# Patient Record
Sex: Male | Born: 1950 | Race: White | Hispanic: No | Marital: Married | Smoking: Former smoker
Health system: Southern US, Community
[De-identification: ages and names within clinical notes are randomized; demographics above are authoritative.]

## PROBLEM LIST (undated history)

## (undated) DIAGNOSIS — E559 Vitamin D deficiency, unspecified: Secondary | ICD-10-CM

## (undated) DIAGNOSIS — F988 Other specified behavioral and emotional disorders with onset usually occurring in childhood and adolescence: Secondary | ICD-10-CM

## (undated) DIAGNOSIS — F32A Depression, unspecified: Secondary | ICD-10-CM

## (undated) DIAGNOSIS — I10 Essential (primary) hypertension: Secondary | ICD-10-CM

## (undated) DIAGNOSIS — F419 Anxiety disorder, unspecified: Secondary | ICD-10-CM

## (undated) DIAGNOSIS — E785 Hyperlipidemia, unspecified: Secondary | ICD-10-CM

## (undated) DIAGNOSIS — E119 Type 2 diabetes mellitus without complications: Secondary | ICD-10-CM

## (undated) DIAGNOSIS — F329 Major depressive disorder, single episode, unspecified: Secondary | ICD-10-CM

## (undated) HISTORY — DX: Major depressive disorder, single episode, unspecified: F32.9

## (undated) HISTORY — DX: Other specified behavioral and emotional disorders with onset usually occurring in childhood and adolescence: F98.8

## (undated) HISTORY — PX: COLONOSCOPY: SHX174

## (undated) HISTORY — DX: Type 2 diabetes mellitus without complications: E11.9

## (undated) HISTORY — DX: Vitamin D deficiency, unspecified: E55.9

## (undated) HISTORY — DX: Anxiety disorder, unspecified: F41.9

## (undated) HISTORY — DX: Hyperlipidemia, unspecified: E78.5

## (undated) HISTORY — DX: Depression, unspecified: F32.A

## (undated) HISTORY — PX: POLYPECTOMY: SHX149

## (undated) HISTORY — DX: Essential (primary) hypertension: I10

---

## 2005-01-08 ENCOUNTER — Emergency Department (HOSPITAL_COMMUNITY): Admission: EM | Admit: 2005-01-08 | Discharge: 2005-01-08 | Payer: Self-pay | Admitting: Emergency Medicine

## 2007-01-10 ENCOUNTER — Ambulatory Visit: Payer: Self-pay | Admitting: Internal Medicine

## 2007-01-24 ENCOUNTER — Ambulatory Visit: Payer: Self-pay | Admitting: Internal Medicine

## 2007-07-18 ENCOUNTER — Emergency Department (HOSPITAL_COMMUNITY): Admission: EM | Admit: 2007-07-18 | Discharge: 2007-07-18 | Payer: Self-pay | Admitting: Emergency Medicine

## 2009-06-27 IMAGING — CT CT MAXILLOFACIAL W/O CM
1 series · 1 of 2 positions shown · IV contrast (agent unspecified)
Comparison: None.

CLINICAL DATA: Fall.  Laceration across nose.  
 MAXILLOFACIAL CT WITHOUT CONTRAST:
TECHNIQUE: Axial and coronal CT imaging was performed through the maxillofacial structures.  No intravenous contrast was administered.

[Series 2: topogram 0.6 t80s · sagittal · 0.6mm · 1.00mm/px · 1 of 2 slices shown]
[im 2/2]
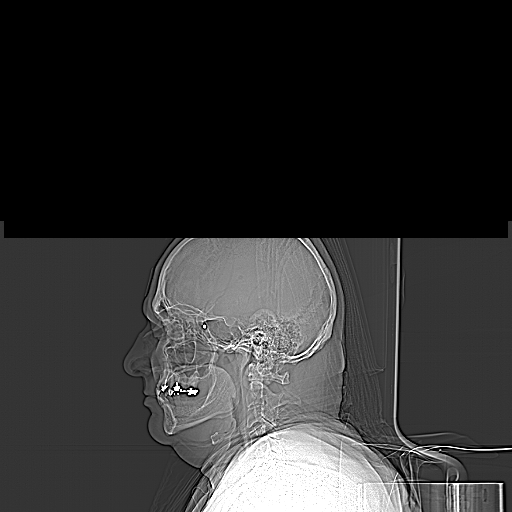

[1 of 2 positions shown; findings below may reference images not displayed]

FINDINGS: Soft tissue swelling is noted over the bridge of the nose with tiny radiopacities that could represent foreign body or blood product.  No underlying fracture is seen.  The orbits and paranasal sinuses are intact.
IMPRESSION: Soft tissue swelling over the bridge of the nose.  Associated contusion/blood product versus foreign body.

## 2012-03-17 ENCOUNTER — Encounter: Payer: Self-pay | Admitting: Internal Medicine

## 2012-11-06 ENCOUNTER — Encounter: Payer: Self-pay | Admitting: Internal Medicine

## 2013-08-23 ENCOUNTER — Encounter: Payer: Self-pay | Admitting: Emergency Medicine

## 2013-08-23 DIAGNOSIS — E1169 Type 2 diabetes mellitus with other specified complication: Secondary | ICD-10-CM | POA: Insufficient documentation

## 2013-08-23 DIAGNOSIS — F325 Major depressive disorder, single episode, in full remission: Secondary | ICD-10-CM | POA: Insufficient documentation

## 2013-08-23 DIAGNOSIS — F32A Depression, unspecified: Secondary | ICD-10-CM

## 2013-08-23 DIAGNOSIS — F329 Major depressive disorder, single episode, unspecified: Secondary | ICD-10-CM

## 2013-08-23 DIAGNOSIS — E1129 Type 2 diabetes mellitus with other diabetic kidney complication: Secondary | ICD-10-CM | POA: Insufficient documentation

## 2013-08-23 DIAGNOSIS — F988 Other specified behavioral and emotional disorders with onset usually occurring in childhood and adolescence: Secondary | ICD-10-CM | POA: Insufficient documentation

## 2013-08-23 DIAGNOSIS — E119 Type 2 diabetes mellitus without complications: Secondary | ICD-10-CM

## 2013-08-23 DIAGNOSIS — E559 Vitamin D deficiency, unspecified: Secondary | ICD-10-CM | POA: Insufficient documentation

## 2013-08-23 DIAGNOSIS — E785 Hyperlipidemia, unspecified: Secondary | ICD-10-CM

## 2013-08-23 DIAGNOSIS — I10 Essential (primary) hypertension: Secondary | ICD-10-CM | POA: Insufficient documentation

## 2013-08-23 DIAGNOSIS — F419 Anxiety disorder, unspecified: Secondary | ICD-10-CM

## 2013-08-27 ENCOUNTER — Ambulatory Visit (INDEPENDENT_AMBULATORY_CARE_PROVIDER_SITE_OTHER): Payer: BC Managed Care – PPO | Admitting: Emergency Medicine

## 2013-08-27 ENCOUNTER — Encounter: Payer: Self-pay | Admitting: Emergency Medicine

## 2013-08-27 VITALS — BP 126/84 | HR 98 | Temp 98.0°F | Resp 18 | Ht 68.0 in | Wt 236.0 lb

## 2013-08-27 DIAGNOSIS — E782 Mixed hyperlipidemia: Secondary | ICD-10-CM

## 2013-08-27 DIAGNOSIS — R7309 Other abnormal glucose: Secondary | ICD-10-CM

## 2013-08-27 DIAGNOSIS — I10 Essential (primary) hypertension: Secondary | ICD-10-CM

## 2013-08-27 DIAGNOSIS — F988 Other specified behavioral and emotional disorders with onset usually occurring in childhood and adolescence: Secondary | ICD-10-CM

## 2013-08-27 DIAGNOSIS — E119 Type 2 diabetes mellitus without complications: Secondary | ICD-10-CM

## 2013-08-27 LAB — HEPATIC FUNCTION PANEL
ALK PHOS: 57 U/L (ref 39–117)
ALT: 15 U/L (ref 0–53)
AST: 15 U/L (ref 0–37)
Albumin: 4.4 g/dL (ref 3.5–5.2)
BILIRUBIN DIRECT: 0.1 mg/dL (ref 0.0–0.3)
BILIRUBIN INDIRECT: 0.4 mg/dL (ref 0.2–1.2)
TOTAL PROTEIN: 7.1 g/dL (ref 6.0–8.3)
Total Bilirubin: 0.5 mg/dL (ref 0.2–1.2)

## 2013-08-27 LAB — BASIC METABOLIC PANEL WITH GFR
BUN: 16 mg/dL (ref 6–23)
CALCIUM: 9.5 mg/dL (ref 8.4–10.5)
CHLORIDE: 105 meq/L (ref 96–112)
CO2: 26 mEq/L (ref 19–32)
Creat: 1.13 mg/dL (ref 0.50–1.35)
GFR, EST AFRICAN AMERICAN: 79 mL/min
GFR, EST NON AFRICAN AMERICAN: 68 mL/min
Glucose, Bld: 178 mg/dL — ABNORMAL HIGH (ref 70–99)
POTASSIUM: 5.2 meq/L (ref 3.5–5.3)
SODIUM: 139 meq/L (ref 135–145)

## 2013-08-27 LAB — CBC WITH DIFFERENTIAL/PLATELET
BASOS ABS: 0 10*3/uL (ref 0.0–0.1)
BASOS PCT: 0 % (ref 0–1)
EOS ABS: 0.2 10*3/uL (ref 0.0–0.7)
EOS PCT: 3 % (ref 0–5)
HEMATOCRIT: 43.7 % (ref 39.0–52.0)
HEMOGLOBIN: 15.4 g/dL (ref 13.0–17.0)
LYMPHS ABS: 1.7 10*3/uL (ref 0.7–4.0)
LYMPHS PCT: 24 % (ref 12–46)
MCH: 31.3 pg (ref 26.0–34.0)
MCHC: 35.2 g/dL (ref 30.0–36.0)
MCV: 88.8 fL (ref 78.0–100.0)
Monocytes Absolute: 0.7 10*3/uL (ref 0.1–1.0)
Monocytes Relative: 10 % (ref 3–12)
NEUTROS PCT: 63 % (ref 43–77)
Neutro Abs: 4.6 10*3/uL (ref 1.7–7.7)
Platelets: 310 10*3/uL (ref 150–400)
RBC: 4.92 MIL/uL (ref 4.22–5.81)
RDW: 13.6 % (ref 11.5–15.5)
WBC: 7.2 10*3/uL (ref 4.0–10.5)

## 2013-08-27 LAB — HEMOGLOBIN A1C
HEMOGLOBIN A1C: 8 % — AB (ref <5.7)
MEAN PLASMA GLUCOSE: 183 mg/dL — AB (ref <117)

## 2013-08-27 LAB — LIPID PANEL
CHOL/HDL RATIO: 3.2 ratio
Cholesterol: 210 mg/dL — ABNORMAL HIGH (ref 0–200)
HDL: 66 mg/dL (ref >39)
LDL Cholesterol: 114 mg/dL — ABNORMAL HIGH (ref 0–99)
Triglycerides: 152 mg/dL — ABNORMAL HIGH (ref <150)
VLDL: 30 mg/dL (ref 0–40)

## 2013-08-27 MED ORDER — METHYLPHENIDATE HCL ER (OSM) 36 MG PO TBCR: 36.0000 mg | EXTENDED_RELEASE_TABLET | Freq: Two times a day (BID) | ORAL | Status: DC

## 2013-08-27 NOTE — Patient Instructions (Signed)
Fat and Cholesterol Control Diet Fat and cholesterol levels in your blood and organs are influenced by your diet. High levels of fat and cholesterol may lead to diseases of the heart, small and large blood vessels, gallbladder, liver, and pancreas. CONTROLLING FAT AND CHOLESTEROL WITH DIET Although exercise and lifestyle factors are important, your diet is key. That is because certain foods are known to raise cholesterol and others to lower it. The goal is to balance foods for their effect on cholesterol and more importantly, to replace saturated and trans fat with other types of fat, such as monounsaturated fat, polyunsaturated fat, and omega-3 fatty acids. On average, a person should consume no more than 15 to 17 g of saturated fat daily. Saturated and trans fats are considered "bad" fats, and they will raise LDL cholesterol. Saturated fats are primarily found in animal products such as meats, butter, and cream. However, that does not mean you need to give up all your favorite foods. Today, there are good tasting, low-fat, low-cholesterol substitutes for most of the things you like to eat. Choose low-fat or nonfat alternatives. Choose round or loin cuts of red meat. These types of cuts are lowest in fat and cholesterol. Chicken (without the skin), fish, veal, and ground turkey breast are great choices. Eliminate fatty meats, such as hot dogs and salami. Even shellfish have little or no saturated fat. Have a 3 oz (85 g) portion when you eat lean meat, poultry, or fish. Trans fats are also called "partially hydrogenated oils." They are oils that have been scientifically manipulated so that they are solid at room temperature resulting in a longer shelf life and improved taste and texture of foods in which they are added. Trans fats are found in stick margarine, some tub margarines, cookies, crackers, and baked goods.  When baking and cooking, oils are a great substitute for butter. The monounsaturated oils are  especially beneficial since it is believed they lower LDL and raise HDL. The oils you should avoid entirely are saturated tropical oils, such as coconut and palm.  Remember to eat a lot from food groups that are naturally free of saturated and trans fat, including fish, fruit, vegetables, beans, grains (barley, rice, couscous, bulgur wheat), and pasta (without cream sauces).  IDENTIFYING FOODS THAT LOWER FAT AND CHOLESTEROL  Soluble fiber may lower your cholesterol. This type of fiber is found in fruits such as apples, vegetables such as broccoli, potatoes, and carrots, legumes such as beans, peas, and lentils, and grains such as barley. Foods fortified with plant sterols (phytosterol) may also lower cholesterol. You should eat at least 2 g per day of these foods for a cholesterol lowering effect.  Read package labels to identify low-saturated fats, trans fat free, and low-fat foods at the supermarket. Select cheeses that have only 2 to 3 g saturated fat per ounce. Use a heart-healthy tub margarine that is free of trans fats or partially hydrogenated oil. When buying baked goods (cookies, crackers), avoid partially hydrogenated oils. Breads and muffins should be made from whole grains (whole-wheat or whole oat flour, instead of "flour" or "enriched flour"). Buy non-creamy canned soups with reduced salt and no added fats.  FOOD PREPARATION TECHNIQUES  Never deep-fry. If you must fry, either stir-fry, which uses very little fat, or use non-stick cooking sprays. When possible, broil, bake, or roast meats, and steam vegetables. Instead of putting butter or margarine on vegetables, use lemon and herbs, applesauce, and cinnamon (for squash and sweet potatoes). Use nonfat   yogurt, salsa, and low-fat dressings for salads.  LOW-SATURATED FAT / LOW-FAT FOOD SUBSTITUTES Meats / Saturated Fat (g)  Avoid: Steak, marbled (3 oz/85 g) / 11 g  Choose: Steak, lean (3 oz/85 g) / 4 g  Avoid: Hamburger (3 oz/85 g) / 7  g  Choose: Hamburger, lean (3 oz/85 g) / 5 g  Avoid: Ham (3 oz/85 g) / 6 g  Choose: Ham, lean cut (3 oz/85 g) / 2.4 g  Avoid: Chicken, with skin, dark meat (3 oz/85 g) / 4 g  Choose: Chicken, skin removed, dark meat (3 oz/85 g) / 2 g  Avoid: Chicken, with skin, light meat (3 oz/85 g) / 2.5 g  Choose: Chicken, skin removed, light meat (3 oz/85 g) / 1 g Dairy / Saturated Fat (g)  Avoid: Whole milk (1 cup) / 5 g  Choose: Low-fat milk, 2% (1 cup) / 3 g  Choose: Low-fat milk, 1% (1 cup) / 1.5 g  Choose: Skim milk (1 cup) / 0.3 g  Avoid: Hard cheese (1 oz/28 g) / 6 g  Choose: Skim milk cheese (1 oz/28 g) / 2 to 3 g  Avoid: Cottage cheese, 4% fat (1 cup) / 6.5 g  Choose: Low-fat cottage cheese, 1% fat (1 cup) / 1.5 g  Avoid: Ice cream (1 cup) / 9 g  Choose: Sherbet (1 cup) / 2.5 g  Choose: Nonfat frozen yogurt (1 cup) / 0.3 g  Choose: Frozen fruit bar / trace  Avoid: Whipped cream (1 tbs) / 3.5 g  Choose: Nondairy whipped topping (1 tbs) / 1 g Condiments / Saturated Fat (g)  Avoid: Mayonnaise (1 tbs) / 2 g  Choose: Low-fat mayonnaise (1 tbs) / 1 g  Avoid: Butter (1 tbs) / 7 g  Choose: Extra light margarine (1 tbs) / 1 g  Avoid: Coconut oil (1 tbs) / 11.8 g  Choose: Olive oil (1 tbs) / 1.8 g  Choose: Corn oil (1 tbs) / 1.7 g  Choose: Safflower oil (1 tbs) / 1.2 g  Choose: Sunflower oil (1 tbs) / 1.4 g  Choose: Soybean oil (1 tbs) / 2.4 g  Choose: Canola oil (1 tbs) / 1 g Document Released: 07/16/2005 Document Revised: 11/10/2012 Document Reviewed: 01/04/2011 [employer] Patient Information 2014 [place], [place]. Diabetes and Exercise Exercising regularly is important. It is not just about losing weight. It has many health benefits, such as:  Improving your overall fitness, flexibility, and endurance.  Increasing your bone density.  Helping with weight control.  Decreasing your body fat.  Increasing your muscle strength.  Reducing stress and  tension.  Improving your overall health. People with diabetes who exercise gain additional benefits because exercise:  Reduces appetite.  Improves the body's use of blood sugar (glucose).  Helps lower or control blood glucose.  Decreases blood pressure.  Helps control blood lipids (such as cholesterol and triglycerides).  Improves the body's use of the hormone insulin by:  Increasing the body's insulin sensitivity.  Reducing the body's insulin needs.  Decreases the risk for heart disease because exercising:  Lowers cholesterol and triglycerides levels.  Increases the levels of good cholesterol (such as high-density lipoproteins [HDL]) in the body.  Lowers blood glucose levels. YOUR ACTIVITY PLAN  Choose an activity that you enjoy and set realistic goals. Your health care provider or diabetes educator can help you make an activity plan that works for you. You can break activities into 2 or 3 sessions throughout the day. Doing so is as good  as one long session. Exercise ideas include:  Taking the dog for a walk.  Taking the stairs instead of the elevator.  Dancing to your favorite song.  Doing your favorite exercise with a friend. RECOMMENDATIONS FOR EXERCISING WITH TYPE 1 OR TYPE 2 DIABETES   Check your blood glucose before exercising. If blood glucose levels are greater than 240 mg/dL, check for urine ketones. Do not exercise if ketones are present.  Avoid injecting insulin into areas of the body that are going to be exercised. For example, avoid injecting insulin into:  The arms when playing tennis.  The legs when jogging.  Keep a record of:  Food intake before and after you exercise.  Expected peak times of insulin action.  Blood glucose levels before and after you exercise.  The type and amount of exercise you have done.  Review your records with your health care provider. Your health care provider will help you to develop guidelines for adjusting food  intake and insulin amounts before and after exercising.  If you take insulin or oral hypoglycemic agents, watch for signs and symptoms of hypoglycemia. They include:  Dizziness.  Shaking.  Sweating.  Chills.  Confusion.  Drink plenty of water while you exercise to prevent dehydration or heat stroke. Body water is lost during exercise and must be replaced.  Talk to your health care provider before starting an exercise program to make sure it is safe for you. Remember, almost any type of activity is better than none. Document Released: 10/06/2003 Document Revised: 03/18/2013 Document Reviewed: 12/23/2012 [facility] Patient Information 2014 [place].

## 2013-08-27 NOTE — Progress Notes (Signed)
Subjective:    Patient ID: Joseph Campbell, male    DOB: 1951-07-22, 63 y.o.   MRN: [id number]  HPI Comments: 63 yo male presents for 3 month F/U for HTN, Cholesterol, DM, D. Deficient, ADD. He is TRYING TO IMPROVE DIET BUT SKIPS MEALS. He occasionally misses Metformin. He is exercising only with work. LAST LABS BS 179 T 210 L 118 A1C 7.7 INSULIN 30 D 49 He notes he seems more fatigued in the evening and often falls asleep watching TV.   He notes ADD is controlled with medications.  Hyperlipidemia  Hypertension  Diabetes Associated symptoms include fatigue.     Medication List       This list is accurate as of: 08/27/13  9:39 AM.  Always use your most recent med list.               ergocalciferol 50000 UNITS capsule  Commonly known as:  VITAMIN D2  Take 50,000 Units by mouth every other day.     metFORMIN 500 MG 24 hr tablet  Commonly known as:  GLUCOPHAGE-XR  Take 500 mg by mouth 2 (two) times daily.     methylphenidate 36 MG CR tablet  Commonly known as:  CONCERTA  Take 36 mg by mouth 2 (two) times daily.     olmesartan 40 MG tablet  Commonly known as:  BENICAR  Take 40 mg by mouth daily.     omega-3 acid ethyl esters 1 G capsule  Commonly known as:  LOVAZA  Take 1 g by mouth 2 (two) times daily.     rosuvastatin 20 MG tablet  Commonly known as:  CRESTOR  Take 20 mg by mouth daily.       ALLERGIES Adderall; Lipitor; Wellbutrin; and Zoloft  Past Medical History  Diagnosis Date  . Hyperlipidemia   . Hypertension   . Anxiety   . Depression   . Diabetes mellitus without complication   . ADD (attention deficit disorder)   . Unspecified vitamin D deficiency      Review of Systems  Constitutional: Positive for fatigue.  All other systems reviewed and are negative.   BP 126/84  Pulse 98  Temp(Src) 98 F (36.7 C) (Temporal)  Resp 18  Ht 5\' 8"  (1.727 m)  Wt 236 lb (107.049 kg)  BMI 35.89 kg/m2     Objective:   Physical Exam  Nursing note and  vitals reviewed. Constitutional: He is oriented to person, place, and time. He appears well-developed and well-nourished.  HENT:  Head: Normocephalic and atraumatic.  Right Ear: External ear normal.  Left Ear: External ear normal.  Nose: Nose normal.  Partially obstructed airway  Eyes: Conjunctivae and EOM are normal.  Neck: Normal range of motion. Neck supple. No JVD present. No thyromegaly present.  Large neck  Cardiovascular: Normal rate, regular rhythm, normal heart sounds and intact distal pulses.   Pulmonary/Chest: Effort normal and breath sounds normal.  Abdominal: Soft. Bowel sounds are normal. He exhibits no distension and no mass. There is no tenderness. There is no rebound and no guarding.  Musculoskeletal: Normal range of motion. He exhibits no edema and no tenderness.  Lymphadenopathy:    He has no cervical adenopathy.  Neurological: He is alert and oriented to person, place, and time. He has normal reflexes. No cranial nerve deficit. Coordination normal.  Skin: Skin is warm and dry.  Psychiatric: He has a normal mood and affect. His behavior is normal. Judgment and thought content normal.  Assessment & Plan:  1.  3 month F/U for Obese,  HTN, Cholesterol,DM, D. Deficient. Needs healthy diet, cardio QD and obtain healthy weight. Check Labs, Check BP if >130/80 call office, Check BS if >200 call office. Consider sleep study with weight and HX. Pt declines at this time 2. ADD- refill RX x 61months

## 2013-08-28 LAB — INSULIN, FASTING: Insulin fasting, serum: 9 u[IU]/mL (ref 3–28)

## 2013-09-29 ENCOUNTER — Encounter: Payer: Self-pay | Admitting: Emergency Medicine

## 2013-09-29 ENCOUNTER — Ambulatory Visit (INDEPENDENT_AMBULATORY_CARE_PROVIDER_SITE_OTHER): Payer: BC Managed Care – PPO | Admitting: Emergency Medicine

## 2013-09-29 VITALS — BP 124/82 | HR 100 | Temp 98.2°F | Resp 18 | Ht 68.0 in | Wt 226.0 lb

## 2013-09-29 DIAGNOSIS — J189 Pneumonia, unspecified organism: Secondary | ICD-10-CM

## 2013-09-29 DIAGNOSIS — J209 Acute bronchitis, unspecified: Secondary | ICD-10-CM

## 2013-09-29 MED ORDER — CEFTRIAXONE SODIUM 1 G IJ SOLR
1.0000 g | Freq: Once | INTRAMUSCULAR | Status: AC
  Administered 2013-09-29: 1 g via INTRAMUSCULAR

## 2013-09-29 MED ORDER — IPRATROPIUM-ALBUTEROL 0.5-2.5 (3) MG/3ML IN SOLN
3.0000 mL | Freq: Once | RESPIRATORY_TRACT | Status: AC
  Administered 2013-09-29: 3 mL via RESPIRATORY_TRACT

## 2013-09-29 MED ORDER — AZITHROMYCIN 250 MG PO TABS: ORAL_TABLET | ORAL | Status: AC

## 2013-09-29 MED ORDER — BENZONATATE 200 MG PO CAPS: 200.0000 mg | ORAL_CAPSULE | Freq: Three times a day (TID) | ORAL | Status: DC | PRN

## 2013-09-29 MED ORDER — PHENYLEPH-PROMETHAZINE-COD 5-6.25-10 MG/5ML PO SYRP: ORAL_SOLUTION | ORAL | Status: DC

## 2013-09-29 MED ORDER — ALBUTEROL SULFATE HFA 108 (90 BASE) MCG/ACT IN AERS: 2.0000 | INHALATION_SPRAY | Freq: Four times a day (QID) | RESPIRATORY_TRACT | Status: DC | PRN

## 2013-09-29 NOTE — Progress Notes (Signed)
   Subjective:    Patient ID: Joseph Campbell, male    DOB: [birth year], 63 y.o.   MRN: [id number]  HPI Comments: 63 yo with chest congestion, wheezing, and pain with hard cough. He cannot get production up due to thickness. He is using Nyquil cold/ flu w/o relief.   Cough   Current Outpatient Prescriptions on File Prior to Visit  Medication Sig Dispense Refill  . ergocalciferol (VITAMIN D2) 50000 UNITS capsule Take 50,000 Units by mouth every other day.      . metFORMIN (GLUCOPHAGE-XR) 500 MG 24 hr tablet Take 500 mg by mouth 2 (two) times daily.      Derrill Memo ON 10/27/2013] methylphenidate (CONCERTA) 36 MG CR tablet Take 1 tablet (36 mg total) by mouth 2 (two) times daily.  60 tablet  0  . olmesartan (BENICAR) 40 MG tablet Take 40 mg by mouth daily.      Marland Kitchen omega-3 acid ethyl esters (LOVAZA) 1 G capsule Take 1 g by mouth 2 (two) times daily.      . rosuvastatin (CRESTOR) 20 MG tablet Take 20 mg by mouth daily.       No current facility-administered medications on file prior to visit.   Allergies  Allergen Reactions  . Adderall [Amphetamine-Dextroamphet Er]     MOOD CHANGE  . Lipitor [Atorvastatin]     MYALGIA  . Wellbutrin [Bupropion]     HA  . Zoloft [Sertraline Hcl]     AGGITATION   Past Medical History  Diagnosis Date  . Hyperlipidemia   . Hypertension   . Anxiety   . Depression   . Diabetes mellitus without complication   . ADD (attention deficit disorder)   . Unspecified vitamin D deficiency        Review of Systems  HENT: Positive for congestion.   Respiratory: Positive for cough.   All other systems reviewed and are negative.   BP 124/82  Pulse 100  Temp(Src) 98.2 F (36.8 C) (Temporal)  Resp 18  Ht 5\' 8"  (1.727 m)  Wt 226 lb (102.513 kg)  BMI 34.37 kg/m2     Objective:   Physical Exam  Nursing note and vitals reviewed. Constitutional: He is oriented to person, place, and time. He appears well-developed and well-nourished.  HENT:  Head:  Normocephalic and atraumatic.  Right Ear: External ear normal.  Left Ear: External ear normal.  Nose: Nose normal.  Mouth/Throat: Oropharynx is clear and moist. No oropharyngeal exudate.  Cloudy TM's bilaterally   Eyes: Conjunctivae are normal.  Neck: Normal range of motion.  Cardiovascular: Normal rate, regular rhythm, normal heart sounds and intact distal pulses.   Pulmonary/Chest: Effort normal.  + rhonchi  Abdominal: Soft.  Musculoskeletal: Normal range of motion.  Lymphadenopathy:    He has no cervical adenopathy.  Neurological: He is alert and oriented to person, place, and time.  Skin: Skin is warm and dry.  Psychiatric: He has a normal mood and affect. Judgment normal.          Assessment & Plan:  1. Pneumonia vs bronchitis- Duoneb given in office with good results with loosened cough, Rocephin 1 gm, ZPAK/ Tessalon Perles/ Albuterol HFA all AD. w/c if SX increase or ER.  2. DM noncompliant discussed need for taking MF 1000mg  BID or will need insulin, EXPLAINED at  Increased risk for heart attack/ stroke/ blindness/ kidney failure/ cancer and many more complications with horrible labs.

## 2013-09-29 NOTE — Patient Instructions (Signed)
Pneumonia, Adult Pneumonia is an infection of the lungs. It may be caused by a germ (virus or bacteria). Some types of pneumonia can spread easily from person to person. This can happen when you cough or sneeze. HOME CARE  Only take medicine as told by your doctor.  Take your medicine (antibiotics) as told. Finish it even if you start to feel better.  Do not smoke.  You may use a vaporizer or humidifier in your room. This can help loosen thick spit (mucus).  Sleep so you are almost sitting up (semi-upright). This helps reduce coughing.  Rest. A shot (vaccine) can help prevent pneumonia. Shots are often advised for:  People over 65 years old.  Patients on chemotherapy.  People with long-term (chronic) lung problems.  People with immune system problems. GET HELP RIGHT AWAY IF:   You are getting worse.  You cannot control your cough, and you are losing sleep.  You cough up blood.  Your pain gets worse, even with medicine.  You have a fever.  Any of your problems are getting worse, not better.  You have shortness of breath or chest pain. MAKE SURE YOU:   Understand these instructions.  Will watch your condition.  Will get help right away if you are not doing well or get worse. Document Released: 01/02/2008 Document Revised: 10/08/2011 Document Reviewed: 10/06/2010 [employer] Patient Information 2014 [employer]. Bronchitis Bronchitis is swelling (inflammation) of the air tubes leading to your lungs (bronchi). This causes mucus and a cough. If the swelling gets bad, you may have trouble breathing. HOME CARE   Rest.  Drink enough fluids to keep your pee (urine) clear or pale yellow (unless you have a condition where you have to watch how much you drink).  Only take medicine as told by your doctor. If you were given antibiotic medicines, finish them even if you start to feel better.  Avoid smoke, irritating chemicals, and strong smells. These make the problem  worse. Quit smoking if you smoke. This helps your lungs heal faster.  Use a cool mist humidifier. Change the water in the humidifier every day. You can also sit in the bathroom with hot shower running for 5 10 minutes. Keep the door closed.  See your health care provider as told.  Wash your hands often. GET HELP IF: Your problems do not get better after 1 week. GET HELP RIGHT AWAY IF:   Your fever gets worse.  You have chills.  Your chest hurts.  Your problems breathing get worse.  You have blood in your mucus.  You pass out (faint).  You feel lightheaded.  You have a bad headache.  You throw up (vomit) again and again. MAKE SURE YOU:  Understand these instructions.  Will watch your condition.  Will get help right away if you are not doing well or get worse. Document Released: 01/02/2008 Document Revised: 05/06/2013 Document Reviewed: 03/10/2013 [employer] Patient Information 2014 [employer].  

## 2013-11-16 ENCOUNTER — Ambulatory Visit (INDEPENDENT_AMBULATORY_CARE_PROVIDER_SITE_OTHER): Payer: BC Managed Care – PPO | Admitting: Internal Medicine

## 2013-11-16 ENCOUNTER — Other Ambulatory Visit: Payer: Self-pay | Admitting: Emergency Medicine

## 2013-11-16 ENCOUNTER — Encounter: Payer: Self-pay | Admitting: Internal Medicine

## 2013-11-16 VITALS — BP 110/78 | HR 80 | Temp 97.7°F | Resp 16 | Ht 68.0 in | Wt 232.4 lb

## 2013-11-16 DIAGNOSIS — F988 Other specified behavioral and emotional disorders with onset usually occurring in childhood and adolescence: Secondary | ICD-10-CM

## 2013-11-16 DIAGNOSIS — E785 Hyperlipidemia, unspecified: Secondary | ICD-10-CM

## 2013-11-16 DIAGNOSIS — E559 Vitamin D deficiency, unspecified: Secondary | ICD-10-CM

## 2013-11-16 DIAGNOSIS — I10 Essential (primary) hypertension: Secondary | ICD-10-CM

## 2013-11-16 DIAGNOSIS — Z79899 Other long term (current) drug therapy: Secondary | ICD-10-CM

## 2013-11-16 DIAGNOSIS — E1129 Type 2 diabetes mellitus with other diabetic kidney complication: Secondary | ICD-10-CM

## 2013-11-16 LAB — CBC WITH DIFFERENTIAL/PLATELET
BASOS PCT: 0 % (ref 0–1)
Basophils Absolute: 0 10*3/uL (ref 0.0–0.1)
EOS ABS: 0.2 10*3/uL (ref 0.0–0.7)
Eosinophils Relative: 3 % (ref 0–5)
HCT: 39.6 % (ref 39.0–52.0)
HEMOGLOBIN: 13.5 g/dL (ref 13.0–17.0)
LYMPHS ABS: 1.8 10*3/uL (ref 0.7–4.0)
LYMPHS PCT: 24 % (ref 12–46)
MCH: 30 pg (ref 26.0–34.0)
MCHC: 34.1 g/dL (ref 30.0–36.0)
MCV: 88 fL (ref 78.0–100.0)
MONO ABS: 0.7 10*3/uL (ref 0.1–1.0)
Monocytes Relative: 9 % (ref 3–12)
NEUTROS ABS: 4.7 10*3/uL (ref 1.7–7.7)
NEUTROS PCT: 64 % (ref 43–77)
Platelets: 281 10*3/uL (ref 150–400)
RBC: 4.5 MIL/uL (ref 4.22–5.81)
RDW: 13.2 % (ref 11.5–15.5)
WBC: 7.3 10*3/uL (ref 4.0–10.5)

## 2013-11-16 LAB — BASIC METABOLIC PANEL WITH GFR
BUN: 27 mg/dL — ABNORMAL HIGH (ref 6–23)
CO2: 25 meq/L (ref 19–32)
CREATININE: 0.93 mg/dL (ref 0.50–1.35)
Calcium: 9.5 mg/dL (ref 8.4–10.5)
Chloride: 103 mEq/L (ref 96–112)
GFR, Est African American: >89 mL/min
GFR, Est Non African American: 86 mL/min
Glucose, Bld: 169 mg/dL — ABNORMAL HIGH (ref 70–99)
POTASSIUM: 4.6 meq/L (ref 3.5–5.3)
Sodium: 137 mEq/L (ref 135–145)

## 2013-11-16 LAB — HEPATIC FUNCTION PANEL
ALBUMIN: 4.1 g/dL (ref 3.5–5.2)
ALT: 14 U/L (ref 0–53)
AST: 12 U/L (ref 0–37)
Alkaline Phosphatase: 51 U/L (ref 39–117)
BILIRUBIN DIRECT: 0.1 mg/dL (ref 0.0–0.3)
BILIRUBIN TOTAL: 0.3 mg/dL (ref 0.2–1.2)
Indirect Bilirubin: 0.2 mg/dL (ref 0.2–1.2)
TOTAL PROTEIN: 6.5 g/dL (ref 6.0–8.3)

## 2013-11-16 LAB — LIPID PANEL
CHOL/HDL RATIO: 2.8 ratio
CHOLESTEROL: 151 mg/dL (ref 0–200)
HDL: 53 mg/dL (ref >39)
LDL Cholesterol: 67 mg/dL (ref 0–99)
TRIGLYCERIDES: 156 mg/dL — AB (ref <150)
VLDL: 31 mg/dL (ref 0–40)

## 2013-11-16 LAB — TSH: TSH: 2.389 u[IU]/mL (ref 0.350–4.500)

## 2013-11-16 LAB — MAGNESIUM: MAGNESIUM: 1.9 mg/dL (ref 1.5–2.5)

## 2013-11-16 LAB — HEMOGLOBIN A1C
HEMOGLOBIN A1C: 7 % — AB (ref <5.7)
MEAN PLASMA GLUCOSE: 154 mg/dL — AB (ref <117)

## 2013-11-16 MED ORDER — OMEGA 3 340 MG PO CPDR: 300.0000 mg | DELAYED_RELEASE_CAPSULE | Freq: Every day | ORAL | Status: AC

## 2013-11-16 MED ORDER — METHYLPHENIDATE HCL ER (OSM) 36 MG PO TBCR: 36.0000 mg | EXTENDED_RELEASE_TABLET | Freq: Two times a day (BID) | ORAL | Status: DC

## 2013-11-16 MED ORDER — METFORMIN HCL ER 500 MG PO TB24: ORAL_TABLET | ORAL | Status: DC

## 2013-11-16 NOTE — Progress Notes (Signed)
Patient ID: Joseph Campbell, male   DOB: 1951-02-10, 63 y.o.   MRN: [id number]    This very nice 63 y.o. [name] presents for 3 month follow up with Hypertension, Hyperlipidemia, Pre-Diabetes and Vitamin D Deficiency.    HTN predates since   . BP has been controlled at home. Today's BP: 110/78 mmHg . Patient denies any cardiac type chest pain, palpitations, dyspnea/orthopnea/PND, dizziness, claudication, or dependent edema.   Hyperlipidemia is controlled with diet & meds. Last Cholesterol was 210, Triglycerides were 152, HDL 66 and LDL 114 in Jan 2015 - not at goal. Patient denies myalgias or other med SE's.    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 He reports or alleges that FBG's usu run in the 120's.  Patient denies any symptoms of reactive hypoglycemia, diabetic polys, paresthesias or visual blurring.    Also, patient has ADD with improvement on Concerta. Further, Patient has history of Vitamin D Deficiency with last vitamin D of  49 in Oct  2014. Patient supplements vitamin D without any suspected side-effects.    Medication List       This list is accurate as of: 11/16/13 10:02 PM.  Always use your most recent med list.               albuterol 108 (90 BASE) MCG/ACT inhaler  Commonly known as:  PROVENTIL HFA;VENTOLIN HFA  Inhale 2 puffs into the lungs every 6 (six) hours as needed for wheezing or shortness of breath.     ergocalciferol 50000 UNITS capsule  Commonly known as:  VITAMIN D2  Take 50,000 Units by mouth every other day.     metFORMIN 500 MG 24 hr tablet  Commonly known as:  GLUCOPHAGE-XR  Take 4 tablets daily     methylphenidate 36 MG CR tablet  Commonly known as:  CONCERTA  Take 1 tablet (36 mg total) by mouth 2 (two) times daily.     olmesartan 40 MG tablet  Commonly known as:  BENICAR  Take 40 mg by mouth daily.     Omega 3 340 MG Cpdr  Take 0.8824 capsules (300 mg total) by  mouth daily.     Phenyleph-Promethazine-Cod 5-6.25-10 MG/5ML Syrp  1 tsp QHS     rosuvastatin 20 MG tablet  Commonly known as:  CRESTOR  Take 20 mg by mouth daily.          Allergies  Allergen Reactions  . Adderall [Amphetamine-Dextroamphet Er]     MOOD CHANGE  . Lipitor [Atorvastatin]     MYALGIA  . Wellbutrin [Bupropion]     HA  . Zoloft [Sertraline Hcl]     AGGITATION    PMHx:   Past Medical History  Diagnosis Date  . Hyperlipidemia   . Hypertension   . Anxiety   . Depression   . Diabetes mellitus without complication   . ADD (attention deficit disorder)   . Unspecified vitamin D deficiency     FHx:    Reviewed / unchanged  SHx:    Reviewed / unchanged   Systems Review: Constitutional: Denies fever, chills, wt changes, headaches, insomnia, fatigue, night sweats, change in appetite. Eyes: Denies redness, blurred vision, diplopia, discharge, itchy, watery eyes.  ENT: Denies discharge, congestion, post nasal drip, epistaxis, sore throat, earache, hearing loss, dental pain, tinnitus, vertigo, sinus pain, snoring.  CV: Denies chest pain, palpitations, irregular heartbeat, syncope, dyspnea,  diaphoresis, orthopnea, PND, claudication, edema. Respiratory: denies cough, dyspnea, DOE, pleurisy, hoarseness, laryngitis, wheezing.  Gastrointestinal: Denies dysphagia, odynophagia, heartburn, reflux, water brash, abdominal pain or cramps, nausea, vomiting, bloating, diarrhea, constipation, hematemesis, melena, hematochezia,  or hemorrhoids. Genitourinary: Denies dysuria, frequency, urgency, nocturia, hesitancy, discharge, hematuria, flank pain. Musculoskeletal: Denies arthralgias, myalgias, stiffness, jt. swelling, pain, limp, strain/sprain.  Skin: Denies pruritus, rash, hives, warts, acne, eczema, change in skin lesion(s). Neuro: No weakness, tremor, incoordination, spasms, paresthesia, or pain. Psychiatric: Denies confusion, memory loss, or sensory loss. Endo: Denies change  in weight, skin, hair change.  Heme/Lymph: No excessive bleeding, bruising, orenlarged lymph nodes.   Exam:  BP 110/78  Pulse 80  Temp(Src) 97.7 F (36.5 C) (Temporal)  Resp 16  Ht 5\' 8"  (1.727 m)  Wt 232 lb 6.4 oz (105.416 kg)  BMI 35.34 kg/m2  Appears well nourished - in no distress. Eyes: PERRLA, EOMs, conjunctiva no swelling or erythema. Sinuses: No frontal/maxillary tenderness ENT/Mouth: EAC's clear, TM's nl w/o erythema, bulging. Nares clear w/o erythema, swelling, exudates. Oropharynx clear without erythema or exudates. Oral hygiene is good. Tongue normal, non obstructing. Hearing intact.  Neck: Supple. Thyroid nl. Car 2+/2+ without bruits, nodes or JVD. Chest: Respirations nl with BS clear & equal w/o rales, rhonchi, wheezing or stridor.  Cor: Heart sounds normal w/ regular rate and rhythm without sig. murmurs, gallops, clicks, or rubs. Peripheral pulses normal and equal  without edema.  Abdomen: Soft & bowel sounds normal. Non-tender w/o guarding, rebound, hernias, masses, or organomegaly.  Lymphatics: Unremarkable.  Musculoskeletal: Full ROM all peripheral extremities, joint stability, 5/5 strength, and normal gait.  Skin: Warm, dry without exposed rashes, lesions, ecchymosis apparent.  Neuro: Cranial nerves intact, reflexes equal bilaterally. Sensory-motor testing grossly intact. Tendon reflexes grossly intact.  Pysch: Alert & oriented x 3. Insight and judgement nl & appropriate. No ideations.  Assessment and Plan:  1. Hypertension - Continue monitor blood pressure at home. Continue diet/meds same.  2. Hyperlipidemia - Continue diet/meds, exercise,& lifestyle modifications. Continue monitor periodic cholesterol/liver & renal functions   3. T2 NIDDM w/Stage 2 CKD - continue recommend prudent low glycemic diet, weight control, regular exercise, diabetic monitoring and periodic eye exams.  4. Vitamin D Deficiency - Continue supplementation.  5. Obesity (BMI  34+)  Recommended regular exercise, BP monitoring, weight control, and discussed med and SE's. Recommended labs to assess and monitor clinical status. Further disposition pending results of labs.

## 2013-11-16 NOTE — Patient Instructions (Signed)

## 2013-11-17 LAB — VITAMIN D 25 HYDROXY (VIT D DEFICIENCY, FRACTURES): Vit D, 25-Hydroxy: 86 ng/mL (ref 30–89)

## 2013-11-17 LAB — INSULIN, FASTING: INSULIN FASTING, SERUM: 40 u[IU]/mL — AB (ref 3–28)

## 2013-11-25 ENCOUNTER — Other Ambulatory Visit: Payer: Self-pay | Admitting: *Deleted

## 2013-11-25 MED ORDER — METFORMIN HCL ER 500 MG PO TB24: ORAL_TABLET | ORAL | Status: DC

## 2014-02-02 ENCOUNTER — Other Ambulatory Visit: Payer: Self-pay | Admitting: Emergency Medicine

## 2014-02-02 DIAGNOSIS — F988 Other specified behavioral and emotional disorders with onset usually occurring in childhood and adolescence: Secondary | ICD-10-CM

## 2014-02-02 MED ORDER — METHYLPHENIDATE HCL ER (OSM) 36 MG PO TBCR: 36.0000 mg | EXTENDED_RELEASE_TABLET | Freq: Two times a day (BID) | ORAL | Status: DC

## 2014-02-13 ENCOUNTER — Other Ambulatory Visit: Payer: Self-pay | Admitting: Internal Medicine

## 2014-02-24 ENCOUNTER — Ambulatory Visit: Payer: Self-pay | Admitting: Emergency Medicine

## 2014-03-12 ENCOUNTER — Other Ambulatory Visit: Payer: Self-pay | Admitting: Internal Medicine

## 2014-03-12 DIAGNOSIS — F988 Other specified behavioral and emotional disorders with onset usually occurring in childhood and adolescence: Secondary | ICD-10-CM

## 2014-03-12 MED ORDER — METHYLPHENIDATE HCL ER (OSM) 36 MG PO TBCR: EXTENDED_RELEASE_TABLET | ORAL | Status: DC

## 2014-04-08 ENCOUNTER — Ambulatory Visit (INDEPENDENT_AMBULATORY_CARE_PROVIDER_SITE_OTHER): Payer: BC Managed Care – PPO | Admitting: Internal Medicine

## 2014-04-08 ENCOUNTER — Encounter: Payer: Self-pay | Admitting: Internal Medicine

## 2014-04-08 VITALS — BP 106/64 | HR 82 | Temp 98.4°F | Resp 16 | Ht 68.0 in | Wt 230.0 lb

## 2014-04-08 DIAGNOSIS — E559 Vitamin D deficiency, unspecified: Secondary | ICD-10-CM

## 2014-04-08 DIAGNOSIS — E785 Hyperlipidemia, unspecified: Secondary | ICD-10-CM

## 2014-04-08 DIAGNOSIS — E1129 Type 2 diabetes mellitus with other diabetic kidney complication: Secondary | ICD-10-CM

## 2014-04-08 DIAGNOSIS — Z79899 Other long term (current) drug therapy: Secondary | ICD-10-CM

## 2014-04-08 DIAGNOSIS — F988 Other specified behavioral and emotional disorders with onset usually occurring in childhood and adolescence: Secondary | ICD-10-CM

## 2014-04-08 DIAGNOSIS — I10 Essential (primary) hypertension: Secondary | ICD-10-CM

## 2014-04-08 LAB — BASIC METABOLIC PANEL WITH GFR
BUN: 15 mg/dL (ref 6–23)
CHLORIDE: 100 meq/L (ref 96–112)
CO2: 24 meq/L (ref 19–32)
CREATININE: 1.21 mg/dL (ref 0.50–1.35)
Calcium: 10.3 mg/dL (ref 8.4–10.5)
GFR, Est African American: 72 mL/min
GFR, Est Non African American: 62 mL/min
Glucose, Bld: 124 mg/dL — ABNORMAL HIGH (ref 70–99)
Potassium: 4.5 mEq/L (ref 3.5–5.3)
Sodium: 134 mEq/L — ABNORMAL LOW (ref 135–145)

## 2014-04-08 LAB — CBC WITH DIFFERENTIAL/PLATELET
Basophils Absolute: 0 10*3/uL (ref 0.0–0.1)
Basophils Relative: 0 % (ref 0–1)
Eosinophils Absolute: 0.2 10*3/uL (ref 0.0–0.7)
Eosinophils Relative: 2 % (ref 0–5)
HEMATOCRIT: 43.7 % (ref 39.0–52.0)
Hemoglobin: 15.6 g/dL (ref 13.0–17.0)
Lymphocytes Relative: 19 % (ref 12–46)
Lymphs Abs: 1.7 10*3/uL (ref 0.7–4.0)
MCH: 31.5 pg (ref 26.0–34.0)
MCHC: 35.7 g/dL (ref 30.0–36.0)
MCV: 88.1 fL (ref 78.0–100.0)
MONO ABS: 1 10*3/uL (ref 0.1–1.0)
MONOS PCT: 12 % (ref 3–12)
Neutro Abs: 5.8 10*3/uL (ref 1.7–7.7)
Neutrophils Relative %: 67 % (ref 43–77)
Platelets: 328 10*3/uL (ref 150–400)
RBC: 4.96 MIL/uL (ref 4.22–5.81)
RDW: 12.9 % (ref 11.5–15.5)
WBC: 8.7 10*3/uL (ref 4.0–10.5)

## 2014-04-08 LAB — LIPID PANEL
Cholesterol: 163 mg/dL (ref 0–200)
HDL: 68 mg/dL (ref >39)
LDL CALC: 69 mg/dL (ref 0–99)
Total CHOL/HDL Ratio: 2.4 Ratio
Triglycerides: 128 mg/dL (ref <150)
VLDL: 26 mg/dL (ref 0–40)

## 2014-04-08 LAB — HEPATIC FUNCTION PANEL
ALK PHOS: 66 U/L (ref 39–117)
ALT: 19 U/L (ref 0–53)
AST: 18 U/L (ref 0–37)
Albumin: 4.8 g/dL (ref 3.5–5.2)
BILIRUBIN TOTAL: 0.5 mg/dL (ref 0.2–1.2)
Bilirubin, Direct: 0.1 mg/dL (ref 0.0–0.3)
Indirect Bilirubin: 0.4 mg/dL (ref 0.2–1.2)
TOTAL PROTEIN: 7.5 g/dL (ref 6.0–8.3)

## 2014-04-08 LAB — HEMOGLOBIN A1C
Hgb A1c MFr Bld: 6.9 % — ABNORMAL HIGH (ref <5.7)
Mean Plasma Glucose: 151 mg/dL — ABNORMAL HIGH (ref <117)

## 2014-04-08 LAB — TSH: TSH: 2.849 u[IU]/mL (ref 0.350–4.500)

## 2014-04-08 LAB — MAGNESIUM: Magnesium: 2 mg/dL (ref 1.5–2.5)

## 2014-04-08 MED ORDER — METHYLPHENIDATE HCL ER (OSM) 36 MG PO TBCR: EXTENDED_RELEASE_TABLET | ORAL | Status: DC

## 2014-04-08 NOTE — Progress Notes (Signed)
Patient ID: Joseph Campbell, male   DOB: 27-Feb-1951, 63 y.o.   MRN: [id number]   This very nice 63 y.o.[name] presents for 3 month follow up with Hypertension, Hyperlipidemia, Pre-Diabetes and Vitamin D Deficiency. Patient also has ADD with improved focusing and concentration on treatment.    Patient has labile HTNsince the 1990's, but Treatment was initiated in 2007. BP has been controlled nd t today's BP 106/64 mmHg. Patient denies any cardiac type chest pain, palpitations, dyspnea/orthopnea/PND, dizziness, claudication, or dependent edema.   Hyperlipidemia is controlled with diet & meds. Patient denies myalgias or other med SE's. Last Lipids were Total Chol is 163; HDL  68; LDL 69; Trig128 on 04/08/2014.   Also, the patient has history of T2_NIDDM since 2012 with A1c 6.2%  and patient denies any symptoms of reactive hypoglycemia, diabetic polys, paresthesias or visual blurring.  Last A1c was  6.9% on 04/08/2014.   Further, Patient has history of Vitamin D Deficiency and patient supplements vitamin D without any suspected side-effects. Last vitamin D was  86 on 11/16/2013.   Medication List   albuterol 108 (90 BASE) MCG/ACT inhaler  Commonly known as:  PROVENTIL HFA;VENTOLIN HFA  Inhale 2 puffs into the lungs every 6 (six) hours as needed for wheezing or shortness of breath.     CRESTOR 20 MG tablet  Generic drug:  rosuvastatin  TAKE ONE TABLET BY MOUTH ONCE DAILY FOR CHOLESTEROL     metFORMIN 500 MG 24 hr tablet  Commonly known as:  GLUCOPHAGE-XR  Take 4 tablets daily     methylphenidate 36 MG CR tablet  Commonly known as:  CONCERTA  Take 1/2 to 1 tablet  Or 2 x daily  Start taking on:  05/09/2014     olmesartan 40 MG tablet  Commonly known as:  BENICAR  Take 40 mg by mouth daily.     Omega 3 340 MG Cpdr  Take 0.8824 capsules (300 mg total) by mouth daily.     Vitamin D3 5000 UNITS Tabs  Take 20,000 Units by mouth daily.     Allergies  Allergen Reactions  . Adderall  [Amphetamine-Dextroamphet Er]     MOOD CHANGE  . Lipitor [Atorvastatin]     MYALGIA  . Wellbutrin [Bupropion]     HA  . Zoloft [Sertraline Hcl]     AGGITATION   PMHx:   Past Medical History  Diagnosis Date  . Hyperlipidemia   . Hypertension   . Anxiety   . Depression   . Diabetes mellitus without complication   . ADD (attention deficit disorder)   . Unspecified vitamin D deficiency    FHx:    Reviewed / unchanged SHx:    Reviewed / unchanged  Systems Review:  Constitutional: Denies fever, chills, wt changes, headaches, insomnia, fatigue, night sweats, change in appetite. Eyes: Denies redness, blurred vision, diplopia, discharge, itchy, watery eyes.  ENT: Denies discharge, congestion, post nasal drip, epistaxis, sore throat, earache, hearing loss, dental pain, tinnitus, vertigo, sinus pain, snoring.  CV: Denies chest pain, palpitations, irregular heartbeat, syncope, dyspnea, diaphoresis, orthopnea, PND, claudication or edema. Respiratory: denies cough, dyspnea, DOE, pleurisy, hoarseness, laryngitis, wheezing.  Gastrointestinal: Denies dysphagia, odynophagia, heartburn, reflux, water brash, abdominal pain or cramps, nausea, vomiting, bloating, diarrhea, constipation, hematemesis, melena, hematochezia  or hemorrhoids. Genitourinary: Denies dysuria, frequency, urgency, nocturia, hesitancy, discharge, hematuria or flank pain. Musculoskeletal: Denies arthralgias, myalgias, stiffness, jt. swelling, pain, limping or strain/sprain.  Skin: Denies pruritus, rash, hives, warts, acne, eczema or change in skin lesion(s).  Neuro: No weakness, tremor, incoordination, spasms, paresthesia or pain. Psychiatric: Denies confusion, memory loss or sensory loss. Endo: Denies change in weight, skin or hair change.  Heme/Lymph: No excessive bleeding, bruising or enlarged lymph nodes.  Exam:  BP 106/64  Pulse 82  Temp 98.4 F   Resp 16  Ht 5\' 8"    Wt 230 lb  BMI 34.98   Appears well nourished and  in no distress. Eyes: PERRLA, EOMs, conjunctiva no swelling or erythema. Sinuses: No frontal/maxillary tenderness ENT/Mouth: EAC's clear, TM's nl w/o erythema, bulging. Nares clear w/o erythema, swelling, exudates. Oropharynx clear without erythema or exudates. Oral hygiene is good. Tongue normal, non obstructing. Hearing intact.  Neck: Supple. Thyroid nl. Car 2+/2+ without bruits, nodes or JVD. Chest: Respirations nl with BS clear & equal w/o rales, rhonchi, wheezing or stridor.  Cor: Heart sounds normal w/ regular rate and rhythm without sig. murmurs, gallops, clicks, or rubs. Peripheral pulses normal and equal  without edema.  Abdomen: Soft & bowel sounds normal. Non-tender w/o guarding, rebound, hernias, masses, or organomegaly.  Lymphatics: Unremarkable.  Musculoskeletal: Full ROM all peripheral extremities, joint stability, 5/5 strength, and normal gait.  Skin: Warm, dry without exposed rashes, lesions or ecchymosis apparent.  Neuro: Cranial nerves intact, reflexes equal bilaterally. Sensory-motor testing grossly intact. Tendon reflexes grossly intact.  Pysch: Alert & oriented x 3.  Insight and judgement nl & appropriate. No ideations.  Assessment and Plan:  1. Hypertension - Continue monitor blood pressure at home. Continue diet/meds same.  2. Hyperlipidemia - Continue diet/meds, exercise,& lifestyle modifications. Continue monitor periodic cholesterol/liver & renal functions   3. T2_NIDDM- Continue diet, exercise, lifestyle modifications. Monitor appropriate labs.  4. Vitamin D Deficiency - Continue supplementation.  5. ADD - Refill Meds for 1 month x 3 scripts  Recommended regular exercise, BP monitoring, weight control, and discussed med and SE's. Recommended labs to assess and monitor clinical status. Further disposition pending results of labs.

## 2014-04-08 NOTE — Patient Instructions (Signed)

## 2014-04-09 LAB — INSULIN, FASTING: Insulin fasting, serum: 9.5 u[IU]/mL (ref 2.0–19.6)

## 2014-04-09 LAB — VITAMIN D 25 HYDROXY (VIT D DEFICIENCY, FRACTURES): VIT D 25 HYDROXY: 74 ng/mL (ref 30–89)

## 2014-05-06 ENCOUNTER — Other Ambulatory Visit: Payer: Self-pay | Admitting: Internal Medicine

## 2014-05-27 ENCOUNTER — Other Ambulatory Visit: Payer: Self-pay | Admitting: Emergency Medicine

## 2014-06-09 ENCOUNTER — Encounter: Payer: Self-pay | Admitting: Internal Medicine

## 2014-06-30 ENCOUNTER — Other Ambulatory Visit: Payer: Self-pay

## 2014-06-30 ENCOUNTER — Other Ambulatory Visit: Payer: Self-pay | Admitting: Internal Medicine

## 2014-06-30 DIAGNOSIS — F988 Other specified behavioral and emotional disorders with onset usually occurring in childhood and adolescence: Secondary | ICD-10-CM

## 2014-06-30 MED ORDER — METHYLPHENIDATE HCL ER (OSM) 36 MG PO TBCR: EXTENDED_RELEASE_TABLET | ORAL | Status: DC

## 2014-06-30 MED ORDER — METFORMIN HCL ER 500 MG PO TB24: 2000.0000 mg | ORAL_TABLET | Freq: Every day | ORAL | Status: DC

## 2014-07-05 ENCOUNTER — Telehealth: Payer: Self-pay | Admitting: *Deleted

## 2014-07-05 NOTE — Telephone Encounter (Signed)
Spoke to [facility] pharmacist to verify patient Concerta 36 mg dose as 1 tablet BID per [facility] Shows.

## 2014-07-21 ENCOUNTER — Encounter: Payer: Self-pay | Admitting: Internal Medicine

## 2014-08-09 ENCOUNTER — Other Ambulatory Visit: Payer: Self-pay | Admitting: Internal Medicine

## 2014-08-09 ENCOUNTER — Other Ambulatory Visit: Payer: Self-pay | Admitting: *Deleted

## 2014-08-09 DIAGNOSIS — F988 Other specified behavioral and emotional disorders with onset usually occurring in childhood and adolescence: Secondary | ICD-10-CM

## 2014-08-09 MED ORDER — METHYLPHENIDATE HCL ER (OSM) 36 MG PO TBCR: EXTENDED_RELEASE_TABLET | ORAL | Status: DC

## 2014-08-10 ENCOUNTER — Telehealth: Payer: Self-pay | Admitting: *Deleted

## 2014-08-10 NOTE — Telephone Encounter (Signed)
Patient requested RX for Concerta be mailed to his home.  Patient advised if RX is lost in the mail, he will not be able to get a new RX until due again in 1 month.

## 2014-09-16 ENCOUNTER — Encounter: Payer: Self-pay | Admitting: Internal Medicine

## 2014-09-16 ENCOUNTER — Other Ambulatory Visit: Payer: Self-pay | Admitting: *Deleted

## 2014-09-16 ENCOUNTER — Ambulatory Visit (INDEPENDENT_AMBULATORY_CARE_PROVIDER_SITE_OTHER): Payer: BLUE CROSS/BLUE SHIELD | Admitting: Internal Medicine

## 2014-09-16 VITALS — BP 118/82 | HR 80 | Temp 98.1°F | Resp 16 | Ht 68.0 in | Wt 239.4 lb

## 2014-09-16 DIAGNOSIS — E1129 Type 2 diabetes mellitus with other diabetic kidney complication: Secondary | ICD-10-CM

## 2014-09-16 DIAGNOSIS — E669 Obesity, unspecified: Secondary | ICD-10-CM | POA: Insufficient documentation

## 2014-09-16 DIAGNOSIS — Z23 Encounter for immunization: Secondary | ICD-10-CM

## 2014-09-16 DIAGNOSIS — F988 Other specified behavioral and emotional disorders with onset usually occurring in childhood and adolescence: Secondary | ICD-10-CM

## 2014-09-16 DIAGNOSIS — I10 Essential (primary) hypertension: Secondary | ICD-10-CM

## 2014-09-16 DIAGNOSIS — Z125 Encounter for screening for malignant neoplasm of prostate: Secondary | ICD-10-CM

## 2014-09-16 DIAGNOSIS — Z79899 Other long term (current) drug therapy: Secondary | ICD-10-CM

## 2014-09-16 DIAGNOSIS — E785 Hyperlipidemia, unspecified: Secondary | ICD-10-CM

## 2014-09-16 DIAGNOSIS — Z111 Encounter for screening for respiratory tuberculosis: Secondary | ICD-10-CM

## 2014-09-16 DIAGNOSIS — R5383 Other fatigue: Secondary | ICD-10-CM

## 2014-09-16 DIAGNOSIS — E559 Vitamin D deficiency, unspecified: Secondary | ICD-10-CM

## 2014-09-16 DIAGNOSIS — F909 Attention-deficit hyperactivity disorder, unspecified type: Secondary | ICD-10-CM

## 2014-09-16 DIAGNOSIS — Z1212 Encounter for screening for malignant neoplasm of rectum: Secondary | ICD-10-CM

## 2014-09-16 LAB — CBC WITH DIFFERENTIAL/PLATELET
Basophils Absolute: 0.1 10*3/uL (ref 0.0–0.1)
Basophils Relative: 1 % (ref 0–1)
EOS ABS: 0.1 10*3/uL (ref 0.0–0.7)
EOS PCT: 1 % (ref 0–5)
HCT: 43.1 % (ref 39.0–52.0)
Hemoglobin: 14.9 g/dL (ref 13.0–17.0)
Lymphocytes Relative: 29 % (ref 12–46)
Lymphs Abs: 1.9 10*3/uL (ref 0.7–4.0)
MCH: 31.2 pg (ref 26.0–34.0)
MCHC: 34.6 g/dL (ref 30.0–36.0)
MCV: 90.2 fL (ref 78.0–100.0)
MONO ABS: 0.7 10*3/uL (ref 0.1–1.0)
MONOS PCT: 11 % (ref 3–12)
MPV: 9.9 fL (ref 8.6–12.4)
Neutro Abs: 3.8 10*3/uL (ref 1.7–7.7)
Neutrophils Relative %: 58 % (ref 43–77)
Platelets: 298 10*3/uL (ref 150–400)
RBC: 4.78 MIL/uL (ref 4.22–5.81)
RDW: 13 % (ref 11.5–15.5)
WBC: 6.6 10*3/uL (ref 4.0–10.5)

## 2014-09-16 MED ORDER — METFORMIN HCL ER 500 MG PO TB24: 2000.0000 mg | ORAL_TABLET | Freq: Every day | ORAL | Status: DC

## 2014-09-16 MED ORDER — METHYLPHENIDATE HCL ER (OSM) 36 MG PO TBCR: EXTENDED_RELEASE_TABLET | ORAL | Status: DC

## 2014-09-16 NOTE — Progress Notes (Signed)
Patient ID: Joseph Campbell, male   DOB: 05/03/1951, 64 y.o.   MRN: [id number] Annual Comprehensive Examination  This very nice 64 y.o.male presents for complete physical.  Patient has been followed for HTN, T2_NIDDM/CKD2 , Hyperlipidemia, and Vitamin D Deficiency. Patient also has ADD and reports improved ability to focus & concentrate with treatment.    HTN predates since the 1990's. Patient's BP has been controlled at home.Today's BP was 118/82 mmHg. Patient denies any cardiac symptoms as chest pain, palpitations, shortness of breath, dizziness or ankle swelling.   Patient's hyperlipidemia is controlled with diet and medications. Patient denies myalgias or other medication SE's. Last lipids were at goal -  Total Chol 163; HDL 68; LDL  69; Trig 128 on 04/08/2014:   Patient has  Morbid Obesity with BMI 36+ and consequent T2_NIDDM  since  June 2012 with A1c 6.2% and T2DM with A1c 7.5% in Aug 2014 and he has Stage 2 CKD with GFR 62 ml/min. Patient denies reactive hypoglycemic symptoms, visual blurring, diabetic polys or paresthesias. Last A1c was  6.9% on 04/08/2014.     Finally, patient has history of Vitamin D Deficiency of 26 in 2008 and last vitamin D was  74 on  04/08/2014.  Medication Sig  . albuterol  HFA  inhaler Inhale 2 puffs into the lungs every 6 (six) hours as needed for wheezing or shortness of breath.  . BENICAR 40 MG tablet TAKE ONE TABLET BY MOUTH ONCE DAILY  . VITAMIN D3 5000 UNITS TABS Take 20,000 Units by mouth daily.  . CRESTOR 20 MG tablet TAKE ONE TABLET BY MOUTH ONCE DAILY FOR CHOLESTEROL  . Omega 3 340 MG CPDR Take 0.8824 capsules (300 mg total) by mouth daily.  . metFORMIN -XR 500 MG 24 hr tablet Take 4 tablets (2,000 mg total) by mouth daily.  . methylphenidate (CONCERTA) 36 MG CR  Take 1 tablet  2 x daily if needed for ADD   Allergies  Allergen Reactions  . Adderall [Amphetamine-Dextroamphet Er]     MOOD CHANGE  . Lipitor [Atorvastatin]     MYALGIA  . Wellbutrin  [Bupropion]     HA  . Zoloft [Sertraline Hcl]     AGGITATION   Past Medical History  Diagnosis Date  . Hyperlipidemia   . Hypertension   . Anxiety   . Depression   . Diabetes mellitus without complication   . ADD (attention deficit disorder)   . Unspecified vitamin D deficiency    Health Maintenance  Topic Date Due  . FOOT EXAM  09/26/1958  . OPHTHALMOLOGY EXAM  09/26/1958  . URINE MICROALBUMIN  09/26/1958  . COLONOSCOPY  09/26/1998  . TETANUS/TDAP  07/30/2013  . INFLUENZA VACCINE  02/27/2014  . HEMOGLOBIN A1C  10/07/2014  . PNEUMOCOCCAL POLYSACCHARIDE VACCINE AGE 66 AND OVER  Completed  . ZOSTAVAX  Completed   Immunization History  Administered Date(s) Administered  . DT 09/16/2014  . PPD Test 09/16/2014  . Pneumococcal-Unspecified 07/31/1999  . Td 07/31/2003  . Zoster 05/26/2013   History reviewed. No pertinent past surgical history.   Family History  Problem Relation Age of Onset  . Hypertension Mother   . Cancer Mother     BREAST  . Hypertension Father   . Hypertension Sister   . Hyperlipidemia Sister    History   Social History  . Marital Status: Married    Spouse Name: N/A  . Number of Children: N/A  . Years of Education: N/A   Occupational History  .  Truck driver of hazardous petroleum products   Social History Main Topics  . Smoking status: Former Smoker    Quit date: 09/30/1963  . Smokeless tobacco: Current User    Types: Chew     Comment: Cigars  . Alcohol Use: No  . Drug Use: No  . Sexual Activity: Not on file    ROS Constitutional: Denies fever, chills, weight loss/gain, headaches, insomnia, fatigue, night sweats or change in appetite. Eyes: Denies redness, blurred vision, diplopia, discharge, itchy or watery eyes.  ENT: Denies discharge, congestion, post nasal drip, epistaxis, sore throat, earache, hearing loss, dental pain, Tinnitus, Vertigo, Sinus pain or snoring.  Cardio: Denies chest pain, palpitations, irregular heartbeat,  syncope, dyspnea, diaphoresis, orthopnea, PND, claudication or edema Respiratory: denies cough, dyspnea, DOE, pleurisy, hoarseness, laryngitis or wheezing.  Gastrointestinal: Denies dysphagia, heartburn, reflux, water brash, pain, cramps, nausea, vomiting, bloating, diarrhea, constipation, hematemesis, melena, hematochezia, jaundice or hemorrhoids Genitourinary: Denies dysuria, frequency, urgency, nocturia, hesitancy, discharge, hematuria or flank pain Musculoskeletal: Denies arthralgia, myalgia, stiffness, Jt. Swelling, pain, limp or strain/sprain. Denies Falls. Skin: Denies puritis, rash, hives, warts, acne, eczema or change in skin lesion Neuro: No weakness, tremor, incoordination, spasms, paresthesia or pain Psychiatric: Denies confusion, memory loss or sensory loss. Denies Depression. Endocrine: Denies change in weight, skin, hair change, nocturia, and paresthesia, diabetic polys, visual blurring or hyper / hypo glycemic episodes.  Heme/Lymph: No excessive bleeding, bruising or enlarged lymph nodes.  Physical Exam  BP 118/82   Pulse 80  Temp  98.1 F   Resp 16  Ht 5\' 8"    Wt 239 lb 6.4 oz     BMI 36.41   General Appearance: Well nourished, in no apparent distress. Eyes: PERRLA, EOMs, conjunctiva no swelling or erythema, normal fundi and vessels. Sinuses: No frontal/maxillary tenderness ENT/Mouth: EACs patent / TMs  nl. Nares clear without erythema, swelling, mucoid exudates. Oral hygiene is good. No erythema, swelling, or exudate. Tongue normal, non-obstructing. Tonsils not swollen or erythematous. Hearing normal.  Neck: Supple, thyroid normal. No bruits, nodes or JVD. Respiratory: Respiratory effort normal.  BS equal and clear bilateral without rales, rhonci, wheezing or stridor. Cardio: Heart sounds are normal with regular rate and rhythm and no murmurs, rubs or gallops. Peripheral pulses are normal and equal bilaterally without edema. No aortic or femoral bruits. Chest: symmetric  with normal excursions and percussion.  Abdomen: Flat, soft, with bowl sounds. Nontender, no guarding, rebound, hernias, masses, or organomegaly.  Lymphatics: Non tender without lymphadenopathy.  Genitourinary: No hernias.Testes nl. DRE - prostate nl for age - smooth & firm w/o nodules. Musculoskeletal: Full ROM all peripheral extremities, joint stability, 5/5 strength, and normal gait. Skin: Warm and dry without rashes, lesions, cyanosis, clubbing or  ecchymosis.  Neuro: Cranial nerves intact, reflexes equal bilaterally. Normal muscle tone, no cerebellar symptoms. Sensation intact.  Pysch: Awake and oriented X 3 with normal affect, insight and judgment appropriate.   Assessment and Plan  1. Essential hypertension  - Microalbumin / creatinine urine ratio - EKG 12-Lead - [place], RETROPERITNL ABD,  LTD  2. Hyperlipidemia  - Lipid panel  3. Morbid obesity (BMI 36.41)   4. Type 2 diabetes mellitus with Stage 2 CKD  - HM DIABETES FOOT EXAM - LOW EXTREMITY NEUR EXAM DOCUM - Hemoglobin A1c - Insulin, fasting  5. ADD (attention deficit disorder)   6. Medication management  - Urine Microscopic - CBC with Differential/Platelet - BASIC METABOLIC PANEL WITH GFR - Hepatic function panel - Magnesium  7. Screening for  rectal cancer  - POC Hemoccult Bld/Stl (3-Cd Home Screen); Future  8. Prostate cancer screening  - PSA  9. Other fatigue  - Vitamin B12 - Testosterone - Iron and TIBC - TSH  10. Vitamin D deficiency  - Vit D  25 hydroxy (rtn osteoporosis monitoring)  11. Need for prophylactic vaccination with tetanus-diphtheria (TD)  - DT Vaccine greater than 7yo IM  12. Screening examination for pulmonary tuberculosis  - PPD   Continue prudent diet as discussed, weight control, BP monitoring, regular exercise, and medications as discussed.  Discussed med effects and SE's. Routine screening labs and tests as requested with regular follow-up as recommended.

## 2014-09-16 NOTE — Patient Instructions (Signed)

## 2014-09-17 LAB — LIPID PANEL
CHOLESTEROL: 171 mg/dL (ref 0–200)
HDL: 68 mg/dL (ref >39)
LDL CALC: 65 mg/dL (ref 0–99)
TRIGLYCERIDES: 189 mg/dL — AB (ref <150)
Total CHOL/HDL Ratio: 2.5 Ratio
VLDL: 38 mg/dL (ref 0–40)

## 2014-09-17 LAB — IRON AND TIBC
%SAT: 48 % (ref 20–55)
Iron: 164 ug/dL (ref 42–165)
TIBC: 340 ug/dL (ref 215–435)
UIBC: 176 ug/dL (ref 125–400)

## 2014-09-17 LAB — URINALYSIS, MICROSCOPIC ONLY
Bacteria, UA: NONE SEEN
Casts: NONE SEEN
Crystals: NONE SEEN
SQUAMOUS EPITHELIAL / LPF: NONE SEEN

## 2014-09-17 LAB — HEPATIC FUNCTION PANEL
ALBUMIN: 4.2 g/dL (ref 3.5–5.2)
ALK PHOS: 67 U/L (ref 39–117)
ALT: 23 U/L (ref 0–53)
AST: 15 U/L (ref 0–37)
BILIRUBIN INDIRECT: 0.4 mg/dL (ref 0.2–1.2)
Bilirubin, Direct: 0.1 mg/dL (ref 0.0–0.3)
TOTAL PROTEIN: 6.7 g/dL (ref 6.0–8.3)
Total Bilirubin: 0.5 mg/dL (ref 0.2–1.2)

## 2014-09-17 LAB — BASIC METABOLIC PANEL WITH GFR
BUN: 19 mg/dL (ref 6–23)
CO2: 23 mEq/L (ref 19–32)
Calcium: 9.1 mg/dL (ref 8.4–10.5)
Chloride: 101 mEq/L (ref 96–112)
Creat: 1.12 mg/dL (ref 0.50–1.35)
GFR, Est African American: 79 mL/min
GFR, Est Non African American: 69 mL/min
Glucose, Bld: 230 mg/dL — ABNORMAL HIGH (ref 70–99)
POTASSIUM: 4.4 meq/L (ref 3.5–5.3)
SODIUM: 134 meq/L — AB (ref 135–145)

## 2014-09-17 LAB — HEMOGLOBIN A1C
HEMOGLOBIN A1C: 9.6 % — AB (ref <5.7)
Mean Plasma Glucose: 229 mg/dL — ABNORMAL HIGH (ref <117)

## 2014-09-17 LAB — TESTOSTERONE: Testosterone: 344 ng/dL (ref 300–890)

## 2014-09-17 LAB — MAGNESIUM: MAGNESIUM: 2.1 mg/dL (ref 1.5–2.5)

## 2014-09-17 LAB — PSA: PSA: 2.07 ng/mL (ref <=4.00)

## 2014-09-17 LAB — VITAMIN B12: Vitamin B-12: 1755 pg/mL — ABNORMAL HIGH (ref 211–911)

## 2014-09-17 LAB — MICROALBUMIN / CREATININE URINE RATIO
CREATININE, URINE: 137.2 mg/dL
Microalb Creat Ratio: 15.3 mg/g (ref 0.0–30.0)
Microalb, Ur: 2.1 mg/dL — ABNORMAL HIGH (ref <2.0)

## 2014-09-17 LAB — INSULIN, FASTING: Insulin fasting, serum: 17.9 u[IU]/mL (ref 2.0–19.6)

## 2014-09-17 LAB — VITAMIN D 25 HYDROXY (VIT D DEFICIENCY, FRACTURES): VIT D 25 HYDROXY: 29 ng/mL — AB (ref 30–100)

## 2014-09-17 LAB — TSH: TSH: 3.35 u[IU]/mL (ref 0.350–4.500)

## 2014-09-19 ENCOUNTER — Other Ambulatory Visit: Payer: Self-pay | Admitting: Internal Medicine

## 2014-09-19 MED ORDER — CHOLECALCIFEROL 1.25 MG (50000 UT) PO TABS: ORAL_TABLET | ORAL | Status: DC

## 2014-11-04 ENCOUNTER — Other Ambulatory Visit: Payer: Self-pay | Admitting: Internal Medicine

## 2014-11-04 DIAGNOSIS — F988 Other specified behavioral and emotional disorders with onset usually occurring in childhood and adolescence: Secondary | ICD-10-CM

## 2014-11-04 MED ORDER — METHYLPHENIDATE HCL ER (OSM) 36 MG PO TBCR: EXTENDED_RELEASE_TABLET | ORAL | Status: DC

## 2014-11-05 ENCOUNTER — Telehealth: Payer: Self-pay | Admitting: *Deleted

## 2014-11-05 NOTE — Telephone Encounter (Signed)
Patient requested Concerta RX be mailed to him.

## 2014-12-16 ENCOUNTER — Other Ambulatory Visit: Payer: Self-pay | Admitting: *Deleted

## 2014-12-16 ENCOUNTER — Telehealth: Payer: Self-pay | Admitting: *Deleted

## 2014-12-16 DIAGNOSIS — F988 Other specified behavioral and emotional disorders with onset usually occurring in childhood and adolescence: Secondary | ICD-10-CM

## 2014-12-16 MED ORDER — METHYLPHENIDATE HCL ER (OSM) 36 MG PO TBCR: EXTENDED_RELEASE_TABLET | ORAL | Status: DC

## 2014-12-16 NOTE — Telephone Encounter (Signed)
RX for Concerta mailed to patient at his request.

## 2014-12-31 ENCOUNTER — Ambulatory Visit (INDEPENDENT_AMBULATORY_CARE_PROVIDER_SITE_OTHER): Payer: BLUE CROSS/BLUE SHIELD | Admitting: Physician Assistant

## 2014-12-31 ENCOUNTER — Encounter: Payer: Self-pay | Admitting: Physician Assistant

## 2014-12-31 VITALS — BP 110/72 | HR 76 | Temp 97.7°F | Resp 16 | Ht 68.0 in | Wt 234.0 lb

## 2014-12-31 DIAGNOSIS — Z79899 Other long term (current) drug therapy: Secondary | ICD-10-CM

## 2014-12-31 DIAGNOSIS — F909 Attention-deficit hyperactivity disorder, unspecified type: Secondary | ICD-10-CM

## 2014-12-31 DIAGNOSIS — E559 Vitamin D deficiency, unspecified: Secondary | ICD-10-CM

## 2014-12-31 DIAGNOSIS — F988 Other specified behavioral and emotional disorders with onset usually occurring in childhood and adolescence: Secondary | ICD-10-CM

## 2014-12-31 DIAGNOSIS — E1129 Type 2 diabetes mellitus with other diabetic kidney complication: Secondary | ICD-10-CM

## 2014-12-31 DIAGNOSIS — I10 Essential (primary) hypertension: Secondary | ICD-10-CM

## 2014-12-31 DIAGNOSIS — E785 Hyperlipidemia, unspecified: Secondary | ICD-10-CM

## 2014-12-31 LAB — CBC WITH DIFFERENTIAL/PLATELET
BASOS ABS: 0 10*3/uL (ref 0.0–0.1)
Basophils Relative: 0 % (ref 0–1)
EOS PCT: 2 % (ref 0–5)
Eosinophils Absolute: 0.1 10*3/uL (ref 0.0–0.7)
HCT: 46.4 % (ref 39.0–52.0)
HEMOGLOBIN: 16.1 g/dL (ref 13.0–17.0)
LYMPHS ABS: 1.7 10*3/uL (ref 0.7–4.0)
Lymphocytes Relative: 26 % (ref 12–46)
MCH: 31.4 pg (ref 26.0–34.0)
MCHC: 34.7 g/dL (ref 30.0–36.0)
MCV: 90.6 fL (ref 78.0–100.0)
MONO ABS: 0.7 10*3/uL (ref 0.1–1.0)
MPV: 9.5 fL (ref 8.6–12.4)
Monocytes Relative: 11 % (ref 3–12)
NEUTROS ABS: 4.1 10*3/uL (ref 1.7–7.7)
NEUTROS PCT: 61 % (ref 43–77)
Platelets: 305 10*3/uL (ref 150–400)
RBC: 5.12 MIL/uL (ref 4.22–5.81)
RDW: 13.3 % (ref 11.5–15.5)
WBC: 6.7 10*3/uL (ref 4.0–10.5)

## 2014-12-31 MED ORDER — METHYLPHENIDATE HCL ER (OSM) 36 MG PO TBCR
EXTENDED_RELEASE_TABLET | ORAL | Status: DC
Start: 2014-12-31 — End: 2014-12-31

## 2014-12-31 MED ORDER — METFORMIN HCL ER 500 MG PO TB24: 2000.0000 mg | ORAL_TABLET | Freq: Every day | ORAL | Status: DC

## 2014-12-31 MED ORDER — METHYLPHENIDATE HCL ER (OSM) 36 MG PO TBCR: EXTENDED_RELEASE_TABLET | ORAL | Status: DC

## 2014-12-31 MED ORDER — ROSUVASTATIN CALCIUM 20 MG PO TABS: ORAL_TABLET | ORAL | Status: DC

## 2014-12-31 MED ORDER — OLMESARTAN MEDOXOMIL 40 MG PO TABS: 40.0000 mg | ORAL_TABLET | Freq: Every day | ORAL | Status: DC

## 2014-12-31 NOTE — Progress Notes (Signed)
Assessment and Plan:  1. Hypertension -Continue medication, monitor blood pressure at home. Continue DASH diet.  Reminder to go to the ER if any CP, SOB, nausea, dizziness, severe HA, changes vision/speech, left arm numbness and tingling and jaw pain.  2. Cholesterol -Continue diet and exercise. Check cholesterol.   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 Check A1C Add bASA  4. Vitamin D Def - check level and continue medications.   5. Obesity with co morbidities - long discussion about weight loss, diet, and exercise   Continue diet and meds as discussed. Further disposition pending results of labs. Discussed med's effects and SE's.   Over 30 minutes of exam, counseling, chart review, and critical decision making was performed   HPI 64 y.o. male  presents for 3 month follow up on hypertension, cholesterol, diabetes and vitamin D deficiency.   His blood pressure has been controlled at home, today his BP is BP: 110/72 mmHg.  He does not workout. He denies chest pain, shortness of breath, dizziness.  He is on cholesterol medication, crestor 20mg , and denies myalgias. His cholesterol is at goal. The cholesterol was:   Lab Results  Component Value Date   CHOL 171 09/16/2014   HDL 68 09/16/2014   LDLCALC 65 09/16/2014   TRIG 189* 09/16/2014   CHOLHDL 2.5 09/16/2014    He has been working on diet and exercise for diabetes with diabetic chronic kidney disease, he is not on bASA, he is on ACE/ARB, and denies  paresthesia of the feet, polydipsia, polyuria and visual disturbances. Last A1C was:  Lab Results  Component Value Date   HGBA1C 9.6* 09/16/2014    Patient is on Vitamin D supplement. Lab Results  Component Value Date   VD25OH 29* 09/16/2014   BMI is Body mass index is 35.59 kg/(m^2)., he is working on diet and exercise. Wt Readings from Last 3 Encounters:  12/31/14 234 lb (106.142 kg)  09/16/14 239 lb 6.4 oz (108.591 kg)  04/08/14  230 lb (104.327 kg)   Patient is on an ADD medication, he states that the medication is helping and he denies any adverse reactions.    Current Medications:  Current Outpatient Prescriptions on File Prior to Visit  Medication Sig Dispense Refill  . albuterol (PROVENTIL HFA;VENTOLIN HFA) 108 (90 BASE) MCG/ACT inhaler Inhale 2 puffs into the lungs every 6 (six) hours as needed for wheezing or shortness of breath. 1 Inhaler 2  . BENICAR 40 MG tablet TAKE ONE TABLET BY MOUTH ONCE DAILY 30 tablet 3  . Cholecalciferol 50000 UNITS TABS Take 1 tablet daily 90 tablet 99  . CRESTOR 20 MG tablet TAKE ONE TABLET BY MOUTH ONCE DAILY FOR CHOLESTEROL 30 tablet 6  . metFORMIN (GLUCOPHAGE-XR) 500 MG 24 hr tablet Take 4 tablets (2,000 mg total) by mouth daily. 120 tablet 6  . methylphenidate (CONCERTA) 36 MG PO CR tablet Take 1 tablet  2 x daily if needed for ADD 60 tablet 0   No current facility-administered medications on file prior to visit.   Medical History:  Past Medical History  Diagnosis Date  . Hyperlipidemia   . Hypertension   . Anxiety   . Depression   . Diabetes mellitus without complication   . ADD (attention deficit disorder)   . Unspecified vitamin D deficiency    Allergies:  Allergies  Allergen Reactions  . Adderall [Amphetamine-Dextroamphet Er]     MOOD CHANGE  . Lipitor [Atorvastatin]     MYALGIA  .  Wellbutrin [Bupropion]     HA  . Zoloft [Sertraline Hcl]     AGGITATION     Review of Systems:  Review of Systems  Constitutional: Negative.   HENT: Negative.   Eyes: Negative.   Respiratory: Negative.   Cardiovascular: Negative.   Gastrointestinal: Negative.   Genitourinary: Negative.   Musculoskeletal: Positive for joint pain ( right knee pain). Negative for myalgias, back pain, falls and neck pain.  Skin: Negative.   Neurological: Negative.   Endo/Heme/Allergies: Negative.   Psychiatric/Behavioral: Negative.     Family history- Review and unchanged Social  history- Review and unchanged Physical Exam: BP 110/72 mmHg  Pulse 76  Temp(Src) 97.7 F (36.5 C)  Resp 16  Ht 5\' 8"  (1.727 m)  Wt 234 lb (106.142 kg)  BMI 35.59 kg/m2 Wt Readings from Last 3 Encounters:  12/31/14 234 lb (106.142 kg)  09/16/14 239 lb 6.4 oz (108.591 kg)  04/08/14 230 lb (104.327 kg)   General Appearance: Well nourished, in no apparent distress. Eyes: PERRLA, EOMs, conjunctiva no swelling or erythema Sinuses: No Frontal/maxillary tenderness ENT/Mouth: Ext aud canals clear, TMs without erythema, bulging. No erythema, swelling, or exudate on post pharynx.  Tonsils not swollen or erythematous. Hearing normal.  Neck: Supple, thyroid normal.  Respiratory: Respiratory effort normal, BS equal bilaterally without rales, rhonchi, wheezing or stridor.  Cardio: RRR with no MRGs. Brisk peripheral pulses without edema.  Abdomen: Soft, + BS, obese, Non tender, no guarding, rebound, hernias, masses. Lymphatics: Non tender without lymphadenopathy.  Musculoskeletal: Full ROM, 5/5 strength, Normal gait Skin: Warm, dry without rashes, lesions, ecchymosis.  Neuro: Cranial nerves intact. No cerebellar symptoms.  Psych: Awake and oriented X 3, normal affect, Insight and Judgment appropriate.    Vicie Mutters, PA-C 9:00 AM [facility]

## 2014-12-31 NOTE — Patient Instructions (Addendum)
Add ENTERIC COATED low dose 81 mg Aspirin daily OR can do every other day if you have easy bruising to protect your heart and head. As well as to reduce risk of Colon Cancer by 20 %, Skin Cancer by 26 % , Melanoma by 46% and Pancreatic cancer by 60%  Diabetes is a very complicated disease...lets simplify it.  An easy way to look at it to understand the complications is if you think of the extra sugar floating in your blood stream as glass shards floating through your blood stream.    Diabetes affects your small vessels first: 1) The glass shards (sugar) scraps down the tiny blood vessels in your eyes and lead to diabetic retinopathy, the leading cause of blindness in the [place]. Diabetes is the leading cause of newly diagnosed adult (54 to 66 years of age) blindness in the [place].  2) The glass shards scratches down the tiny vessels of your legs leading to nerve damage called neuropathy and can lead to amputations of your feet. More than 60% of all non-traumatic amputations of lower limbs occur in people with diabetes.  3) Over time the small vessels in your brain are shredded and closed off, individually this does not cause any problems but over a long period of time many of the small vessels being blocked can lead to Vascular Dementia.   4) Your kidney's are a filter system and have a "net" that keeps certain things in the body and lets bad things out. Sugar shreds this net and leads to kidney damage and eventually failure. Decreasing the sugar that is destroying the net and certain blood pressure medications can help stop or decrease progression of kidney disease. Diabetes was the primary cause of kidney failure in 44 percent of all new cases in 2011.  5) Diabetes also destroys the small vessels in your penis that lead to erectile dysfunction. Eventually the vessels are so damaged that you may not be responsive to cialis or viagra.   Diabetes and your large vessels: Your larger vessels  consist of your coronary arteries in your heart and the carotid vessels to your brain. Diabetes or even increased sugars put you at 300% increased risk of heart attack and stroke and this is why.. The sugar scrapes down your large blood vessels and your body sees this as an internal injury and tries to repair itself. Just like you get a scab on your skin, your platelets will stick to the blood vessel wall trying to heal it. This is why we have diabetics on low dose aspirin daily, this prevents the platelets from sticking and can prevent plaque formation. In addition, your body takes cholesterol and tries to shove it into the open wound. This is why we want your LDL, or bad cholesterol, below 70.      Bad carbs also include fruit juice, alcohol, and sweet tea. These are empty calories that do not signal to your brain that you are full.   Please remember the good carbs are still carbs which convert into sugar. So please measure them out no more than 1/2-1 cup of rice, oatmeal, pasta, and beans  Veggies are however free foods! Pile them on.   Not all fruit is created equal. Please see the list below, the fruit at the bottom is higher in sugars than the fruit at the top. Please avoid all dried fruits.       The combination of platelets and cholesterol over 5-10 years forms plaque that  can break off and cause a heart attack or stroke.   PLEASE REMEMBER:  Diabetes is preventable! Up to 63 percent of complications and morbidities among individuals with type 2 diabetes can be prevented, delayed, or effectively treated and minimized with regular visits to a health professional, appropriate monitoring and medication, and a healthy diet and lifestyle.  We want weight loss that will last so you should lose 1-2 pounds a week.  THAT IS IT! Please pick THREE things a month to change. Once it is a habit check off the item. Then pick another three items off the list to become habits.  If you are already doing a  habit on the list GREAT!  Cross that item off! o Don't drink your calories. Ie, alcohol, soda, fruit juice, and sweet tea.  o Drink more water. Drink a glass when you feel hungry or before each meal.  o Eat breakfast - Complex carb and protein (likeDannon light and fit yogurt, oatmeal, fruit, eggs, [place] bacon). o Measure your cereal.  Eat no more than one cup a day. (ie [place]) o Eat an apple a day. o Add a vegetable a day. o Try a new vegetable a month. o Use Pam! Stop using oil or butter to cook. o Don't finish your plate or use smaller plates. o Share your dessert. o Eat sugar free Jello for dessert or frozen grapes. o Don't eat 2-3 hours before bed. o Switch to whole wheat bread, pasta, and brown rice. o Make healthier choices when you eat out. No fries! o Pick baked chicken, NOT fried. o Don't forget to SLOW DOWN when you eat. It is not going anywhere.  o Take the stairs. o Park far away in the parking lot o [employer] (or weights) for 10 minutes while watching TV. o Walk at work for 10 minutes during break. o Walk outside 1 time a week with your friend, kids, dog, or significant other. o Start a walking group at [place] the mall as much as you can tolerate.  o Keep a food diary. o Weigh yourself daily. o Walk for 15 minutes 3 days per week. o Cook at home more often and eat out less.  If life happens and you go back to old habits, it is okay.  Just start over. You can do it!   If you experience chest pain, get short of breath, or tired during the exercise, please stop immediately and inform your doctor.

## 2015-01-01 LAB — BASIC METABOLIC PANEL WITH GFR
BUN: 18 mg/dL (ref 6–23)
CALCIUM: 9.2 mg/dL (ref 8.4–10.5)
CHLORIDE: 102 meq/L (ref 96–112)
CO2: 24 meq/L (ref 19–32)
CREATININE: 1.09 mg/dL (ref 0.50–1.35)
GFR, EST AFRICAN AMERICAN: 81 mL/min
GFR, EST NON AFRICAN AMERICAN: 70 mL/min
GLUCOSE: 246 mg/dL — AB (ref 70–99)
POTASSIUM: 4.8 meq/L (ref 3.5–5.3)
Sodium: 135 mEq/L (ref 135–145)

## 2015-01-01 LAB — HEMOGLOBIN A1C
HEMOGLOBIN A1C: 10.1 % — AB (ref <5.7)
Mean Plasma Glucose: 243 mg/dL — ABNORMAL HIGH (ref <117)

## 2015-01-01 LAB — HEPATIC FUNCTION PANEL
ALT: 18 U/L (ref 0–53)
AST: 14 U/L (ref 0–37)
Albumin: 4.6 g/dL (ref 3.5–5.2)
Alkaline Phosphatase: 70 U/L (ref 39–117)
BILIRUBIN INDIRECT: 0.3 mg/dL (ref 0.2–1.2)
BILIRUBIN TOTAL: 0.4 mg/dL (ref 0.2–1.2)
Bilirubin, Direct: 0.1 mg/dL (ref 0.0–0.3)
Total Protein: 6.7 g/dL (ref 6.0–8.3)

## 2015-01-01 LAB — LIPID PANEL
CHOL/HDL RATIO: 3.1 ratio
Cholesterol: 191 mg/dL (ref 0–200)
HDL: 62 mg/dL (ref >=40)
LDL CALC: 108 mg/dL — AB (ref 0–99)
Triglycerides: 104 mg/dL (ref <150)
VLDL: 21 mg/dL (ref 0–40)

## 2015-01-01 LAB — VITAMIN D 25 HYDROXY (VIT D DEFICIENCY, FRACTURES): Vit D, 25-Hydroxy: 31 ng/mL (ref 30–100)

## 2015-01-01 LAB — INSULIN, FASTING: Insulin fasting, serum: 7.9 u[IU]/mL (ref 2.0–19.6)

## 2015-01-01 LAB — TSH: TSH: 2.159 u[IU]/mL (ref 0.350–4.500)

## 2015-01-01 LAB — MAGNESIUM: Magnesium: 2 mg/dL (ref 1.5–2.5)

## 2015-01-03 ENCOUNTER — Other Ambulatory Visit: Payer: Self-pay

## 2015-01-03 MED ORDER — ERGOCALCIFEROL 1.25 MG (50000 UT) PO CAPS: 50000.0000 [IU] | ORAL_CAPSULE | ORAL | Status: DC

## 2015-02-04 ENCOUNTER — Encounter: Payer: Self-pay | Admitting: Physician Assistant

## 2015-02-04 ENCOUNTER — Ambulatory Visit (INDEPENDENT_AMBULATORY_CARE_PROVIDER_SITE_OTHER): Payer: BLUE CROSS/BLUE SHIELD | Admitting: Physician Assistant

## 2015-02-04 VITALS — BP 120/78 | HR 80 | Temp 97.7°F | Resp 16 | Ht 68.0 in | Wt 233.0 lb

## 2015-02-04 DIAGNOSIS — L089 Local infection of the skin and subcutaneous tissue, unspecified: Secondary | ICD-10-CM | POA: Diagnosis not present

## 2015-02-04 DIAGNOSIS — L918 Other hypertrophic disorders of the skin: Secondary | ICD-10-CM | POA: Diagnosis not present

## 2015-02-04 DIAGNOSIS — E1129 Type 2 diabetes mellitus with other diabetic kidney complication: Secondary | ICD-10-CM | POA: Diagnosis not present

## 2015-02-04 NOTE — Progress Notes (Signed)
Diabetes Education and Follow-Up Visit  64 y.o.male presents for diabetic education for DM II with CKD. He complains of none. Patient denies paresthesia of the feet, polydipsia, polyuria and visual disturbances. The patient is on an ACE/ARB, he is on a low dose aspirin at this time. The patient does not exercise. He does not currently smoke, does smokeless tobacco, and does currently drink alcohol which is rare.  The patient is checking his sugars at home. He is on metformin 4 a day.  Lab Results  Component Value Date   HGBA1C 10.1* 12/31/2014   Home BG Monitoring:  Checking 1 times a day. Average:  140 in the AM  He has a skin tag on his right temple that gets caught on his glasses/comb and would like removed.   Current Outpatient Prescriptions on File Prior to Visit  Medication Sig Dispense Refill  . albuterol (PROVENTIL HFA;VENTOLIN HFA) 108 (90 BASE) MCG/ACT inhaler Inhale 2 puffs into the lungs every 6 (six) hours as needed for wheezing or shortness of breath. 1 Inhaler 2  . ergocalciferol (VITAMIN D2) 50000 UNITS capsule Take 1 capsule (50,000 Units total) by mouth 3 (three) times a week. 12 capsule PRN  . metFORMIN (GLUCOPHAGE-XR) 500 MG 24 hr tablet Take 4 tablets (2,000 mg total) by mouth daily. 360 tablet 3  . methylphenidate (CONCERTA) 36 MG PO CR tablet Take 1 tablet  2 x daily if needed for ADD 60 tablet 0  . olmesartan (BENICAR) 40 MG tablet Take 1 tablet (40 mg total) by mouth daily. 30 tablet 3  . rosuvastatin (CRESTOR) 20 MG tablet TAKE ONE TABLET BY MOUTH ONCE DAILY FOR CHOLESTEROL 30 tablet 6   No current facility-administered medications on file prior to visit.   Past Medical History  Diagnosis Date  . Hyperlipidemia   . Hypertension   . Anxiety   . Depression   . Diabetes mellitus without complication   . ADD (attention deficit disorder)   . Unspecified vitamin D deficiency       ROS: no polyuria or polydipsia, no chest pain, dyspnea or TIA's, no numbness,  tingling or pain in extremities  Physical Exam: Blood pressure 120/78, pulse 80, temperature 97.7 F (36.5 C), resp. rate 16, height 5\' 8"  (1.727 m), weight 233 lb (105.688 kg). Body mass index is 35.44 kg/(m^2).  Wt Readings from Last 3 Encounters:  02/04/15 233 lb (105.688 kg)  12/31/14 234 lb (106.142 kg)  09/16/14 239 lb 6.4 oz (108.591 kg)   General Appearance:  alert, oriented, no acute distress and obese heart sounds regular rate and rhythm, S1, S2 normal, no murmur, click, rub or gallop, chest clear, no hepatosplenomegaly, no carotid bruits Right temple irritated skin tag  Labs: Lab Results  Component Value Date   HGBA1C 10.1* 12/31/2014    Lab Results  Component Value Date   MICROALBUR 2.1* 09/16/2014    Lab Results  Component Value Date   CHOL 191 12/31/2014   HDL 62 12/31/2014   LDLCALC 108* 12/31/2014   TRIG 104 12/31/2014   CHOLHDL 3.1 12/31/2014     Plan and Assessment: Diabetes Mellitus type 2:  Diabetes mellitus Type II, under poor control. Diabetes related complications: nephropathy Education: Reviewed 'ABCs' of diabetes management (respective goals in parentheses):  A1C (<7), blood pressure (<130/80), and cholesterol (LDL <100) Eye Exam yearly and Dental Exam every 6 months. Dietary recommendations Physical Activity recommendations Want to try diet first, if A1C not better will add DDP4  Compliance at present is  estimated to be inadequate. Efforts to improve compliance (if necessary) will be directed at dietary modifications: decrease carbs, increased exercise, regular blood sugar monitoring: 2 times daily and try to get regular times to take medications.   Blood pressure: normal blood pressure.   An ACE/ARB is currently part of their treatment regimen.  LDL goal of < 100, HDL > 40 and TG < 150. Dyslipidemia under fair control. A statin is currently part of their treatment regimen.  Skin tag right temple removed with complications.   Follow up: 2  months   Future Appointments Date Time Provider Anita  04/19/2015 8:45 AM Unk Pinto, MD [facility] None  09/30/2015 9:00 AM Unk Pinto, MD [facility] None

## 2015-02-04 NOTE — Patient Instructions (Addendum)
Eat breakfast, can do boiled eggs, whole wheat toast with almond butter 1 tbsp Do 100 oz of water a day.    We are starting you on Metformin to prevent or treat diabetes. Metformin does not cause low blood sugars. In order to create energy your cells need insulin and sugar but sometime your cells do not accept the insulin and this can cause increased sugars and decreased energy. The Metformin helps your cells accept insulin and the sugar to give you more energy.   The two most common side effects are nausea and diarrhea, follow these rules to avoid it! You can take imodium per box instructions when starting metformin if needed.   Rules of metformin: 1) start out slow with only one pill daily. Our goal for you is 4 pills a day or 2000mg  total.  2) take with your largest meal. 3) Take with least amount of carbs.   Call if you have any problems.    Your A1C is a measure of your sugar over the past 3 months and is not affected by what you have eaten over the past few days. Diabetes increases your chances of stroke and heart attack over 300 % and is the leading cause of blindness and kidney failure in the [place]. Please make sure you decrease bad carbs like white bread, white rice, potatoes, corn, soft drinks, pasta, cereals, refined sugars, sweet tea, dried fruits, and fruit juice. Good carbs are okay to eat in moderation like sweet potatoes, brown rice, whole grain pasta/bread, most fruit (except dried fruit) and you can eat as many veggies as you want.   Greater than 6.5 is considered diabetic. Between 6.4 and 5.7 is prediabetic If your A1C is less than 5.7 you are NOT diabetic.  Targets for Glucose Readings: Time of Check Target for patients WITHOUT Diabetes Target for DIABETICS  Before Meals Less than 100  less than 150  Two hours after meals Less than 200  Less than 250

## 2015-04-19 ENCOUNTER — Ambulatory Visit (INDEPENDENT_AMBULATORY_CARE_PROVIDER_SITE_OTHER): Payer: BLUE CROSS/BLUE SHIELD | Admitting: Internal Medicine

## 2015-04-19 ENCOUNTER — Encounter: Payer: Self-pay | Admitting: Internal Medicine

## 2015-04-19 VITALS — BP 114/76 | HR 84 | Temp 97.5°F | Resp 16 | Ht 68.0 in | Wt 230.8 lb

## 2015-04-19 DIAGNOSIS — F909 Attention-deficit hyperactivity disorder, unspecified type: Secondary | ICD-10-CM

## 2015-04-19 DIAGNOSIS — Z79899 Other long term (current) drug therapy: Secondary | ICD-10-CM | POA: Diagnosis not present

## 2015-04-19 DIAGNOSIS — Z6835 Body mass index (BMI) 35.0-35.9, adult: Secondary | ICD-10-CM | POA: Diagnosis not present

## 2015-04-19 DIAGNOSIS — E559 Vitamin D deficiency, unspecified: Secondary | ICD-10-CM | POA: Diagnosis not present

## 2015-04-19 DIAGNOSIS — I10 Essential (primary) hypertension: Secondary | ICD-10-CM

## 2015-04-19 DIAGNOSIS — F988 Other specified behavioral and emotional disorders with onset usually occurring in childhood and adolescence: Secondary | ICD-10-CM

## 2015-04-19 DIAGNOSIS — E1122 Type 2 diabetes mellitus with diabetic chronic kidney disease: Secondary | ICD-10-CM

## 2015-04-19 DIAGNOSIS — N189 Chronic kidney disease, unspecified: Secondary | ICD-10-CM | POA: Diagnosis not present

## 2015-04-19 DIAGNOSIS — Z6836 Body mass index (BMI) 36.0-36.9, adult: Secondary | ICD-10-CM | POA: Diagnosis not present

## 2015-04-19 DIAGNOSIS — E785 Hyperlipidemia, unspecified: Secondary | ICD-10-CM | POA: Diagnosis not present

## 2015-04-19 LAB — CBC WITH DIFFERENTIAL/PLATELET
BASOS PCT: 0 % (ref 0–1)
Basophils Absolute: 0 10*3/uL (ref 0.0–0.1)
EOS ABS: 0.1 10*3/uL (ref 0.0–0.7)
Eosinophils Relative: 2 % (ref 0–5)
HCT: 43.4 % (ref 39.0–52.0)
Hemoglobin: 14.7 g/dL (ref 13.0–17.0)
Lymphocytes Relative: 26 % (ref 12–46)
Lymphs Abs: 1.8 10*3/uL (ref 0.7–4.0)
MCH: 30.9 pg (ref 26.0–34.0)
MCHC: 33.9 g/dL (ref 30.0–36.0)
MCV: 91.4 fL (ref 78.0–100.0)
MONO ABS: 0.8 10*3/uL (ref 0.1–1.0)
MONOS PCT: 11 % (ref 3–12)
MPV: 9.8 fL (ref 8.6–12.4)
NEUTROS PCT: 61 % (ref 43–77)
Neutro Abs: 4.3 10*3/uL (ref 1.7–7.7)
PLATELETS: 301 10*3/uL (ref 150–400)
RBC: 4.75 MIL/uL (ref 4.22–5.81)
RDW: 13.4 % (ref 11.5–15.5)
WBC: 7.1 10*3/uL (ref 4.0–10.5)

## 2015-04-19 LAB — LIPID PANEL
CHOLESTEROL: 167 mg/dL (ref 125–200)
HDL: 59 mg/dL (ref >=40)
LDL CALC: 71 mg/dL (ref <130)
Total CHOL/HDL Ratio: 2.8 Ratio (ref <=5.0)
Triglycerides: 186 mg/dL — ABNORMAL HIGH (ref <150)
VLDL: 37 mg/dL — AB (ref <30)

## 2015-04-19 LAB — HEPATIC FUNCTION PANEL
ALK PHOS: 69 U/L (ref 40–115)
ALT: 20 U/L (ref 9–46)
AST: 16 U/L (ref 10–35)
Albumin: 4.4 g/dL (ref 3.6–5.1)
BILIRUBIN INDIRECT: 0.4 mg/dL (ref 0.2–1.2)
Bilirubin, Direct: 0.1 mg/dL (ref <=0.2)
TOTAL PROTEIN: 6.8 g/dL (ref 6.1–8.1)
Total Bilirubin: 0.5 mg/dL (ref 0.2–1.2)

## 2015-04-19 LAB — MAGNESIUM: Magnesium: 1.9 mg/dL (ref 1.5–2.5)

## 2015-04-19 LAB — HEMOGLOBIN A1C
HEMOGLOBIN A1C: 9.1 % — AB (ref <5.7)
MEAN PLASMA GLUCOSE: 214 mg/dL — AB (ref <117)

## 2015-04-19 LAB — BASIC METABOLIC PANEL WITH GFR
BUN: 17 mg/dL (ref 7–25)
CALCIUM: 9.4 mg/dL (ref 8.6–10.3)
CO2: 26 mmol/L (ref 20–31)
Chloride: 101 mmol/L (ref 98–110)
Creat: 1.07 mg/dL (ref 0.70–1.25)
GFR, EST NON AFRICAN AMERICAN: 72 mL/min (ref >=60)
GFR, Est African American: 83 mL/min (ref >=60)
GLUCOSE: 195 mg/dL — AB (ref 65–99)
Potassium: 4.5 mmol/L (ref 3.5–5.3)
Sodium: 137 mmol/L (ref 135–146)

## 2015-04-19 LAB — TSH: TSH: 2.989 u[IU]/mL (ref 0.350–4.500)

## 2015-04-19 MED ORDER — CONCERTA 36 MG PO TBCR
EXTENDED_RELEASE_TABLET | ORAL | Status: DC
Start: 2015-04-19 — End: 2015-07-26

## 2015-04-19 NOTE — Patient Instructions (Signed)

## 2015-04-19 NOTE — Progress Notes (Signed)
Patient ID: Joseph Campbell, male   DOB: February 27, 1951, 64 y.o.   MRN: [id number]   This very nice 64 y.o. [name] presents for  follow up with Hypertension, Hyperlipidemia, Pre-Diabetes, ADD and Vitamin D Deficiency.    Patient is treated for HTN since the 1990's & BP has been controlled at home. Today's BP: 114/76 mmHg. Patient has had no complaints of any cardiac type chest pain, palpitations, dyspnea/orthopnea/PND, dizziness, claudication, or dependent edema.   Hyperlipidemia is controlled with diet & meds. Patient denies myalgias or other med SE's. Last Lipids were near , but not at goal -  Cholesterol 191; HDL 62; LDL 108*; Triglycerides 104 on 12/31/2014.   Also, the patient has history of T2_NIDDM  W/ CKD2 (GFR62 ml/min) and has had no symptoms of reactive hypoglycemia, diabetic polys, paresthesias or visual blurring.  Last A1c was  10.1% on 12/31/2014.   Further, the patient also has history of Vitamin D Deficiency of 26 in 2008and very sporadically supplements vitamin D without any suspected side-effects. Last vitamin D was still very low at 31 on 12/31/2014.  Medication Sig  . Albuterol  HFA inhaler Inhale 2 puffs into the lungs every 6 (six) hours as needed for wheezing or shortness of breath.  Marland Kitchen VITAMIN D2 50,000 UNITS  Take 1 capsule (50,000 Units total) by mouth 3 (three) times a week.  . metFORMIN-XR 500 MG 24 hr tablet Take 4 tablets (2,000 mg total) by mouth daily.  Marland Kitchen olmesartan 40 MG tablet Take 1 tablet (40 mg total) by mouth daily.  . rosuvastatin  20 MG tablet TAKE ONE TABLET BY MOUTH ONCE DAILY FOR CHOLESTEROL  . methylphenidate (CONCERTA) 36 MG  CR Take 1 tablet  2 x daily if needed for ADD   Allergies  Allergen Reactions  . Adderall [Amphetamine-Dextroamphet Er]     MOOD CHANGE  . Lipitor [Atorvastatin]     MYALGIA  . Wellbutrin [Bupropion]     HA  . Zoloft [Sertraline Hcl]     AGGITATION    PMHx:   Past Medical History  Diagnosis Date  . Hyperlipidemia   . Hypertension    . Anxiety   . Depression   . Diabetes mellitus without complication   . ADD (attention deficit disorder)   . Unspecified vitamin D deficiency    Immunization History  Administered Date(s) Administered  . DT 09/16/2014  . PPD Test 09/16/2014  . Pneumococcal-Unspecified 07/31/1999  . Td 07/31/2003  . Zoster 05/26/2013   No past surgical history on file. FHx:    Reviewed / unchanged  SHx:    Reviewed / unchanged  Systems Review:  Constitutional: Denies fever, chills, wt changes, headaches, insomnia, fatigue, night sweats, change in appetite. Eyes: Denies redness, blurred vision, diplopia, discharge, itchy, watery eyes.  ENT: Denies discharge, congestion, post nasal drip, epistaxis, sore throat, earache, hearing loss, dental pain, tinnitus, vertigo, sinus pain, snoring.  CV: Denies chest pain, palpitations, irregular heartbeat, syncope, dyspnea, diaphoresis, orthopnea, PND, claudication or edema. Respiratory: denies cough, dyspnea, DOE, pleurisy, hoarseness, laryngitis, wheezing.  Gastrointestinal: Denies dysphagia, odynophagia, heartburn, reflux, water brash, abdominal pain or cramps, nausea, vomiting, bloating, diarrhea, constipation, hematemesis, melena, hematochezia  or hemorrhoids. Genitourinary: Denies dysuria, frequency, urgency, nocturia, hesitancy, discharge, hematuria or flank pain. Musculoskeletal: Denies arthralgias, myalgias, stiffness, jt. swelling, pain, limping or strain/sprain.  Skin: Denies pruritus, rash, hives, warts, acne, eczema or change in skin lesion(s). Neuro: No weakness, tremor, incoordination, spasms, paresthesia or pain. Psychiatric: Denies confusion, memory loss or sensory loss.  Endo: Denies change in weight, skin or hair change.  Heme/Lymph: No excessive bleeding, bruising or enlarged lymph nodes.  Physical Exam  BP 114/76 mmHg  Pulse 84  Temp(Src) 97.5 F (36.4 C)  Resp 16  Ht 5\' 8"  (1.727 m)  Wt 230 lb 12.8 oz (104.69 kg)  BMI 35.10  kg/m2  Appears well nourished and in no distress. Eyes: PERRLA, EOMs, conjunctiva no swelling or erythema. Sinuses: No frontal/maxillary tenderness ENT/Mouth: EAC's clear, TM's nl w/o erythema, bulging. Nares clear w/o erythema, swelling, exudates. Oropharynx clear without erythema or exudates. Oral hygiene is good. Tongue normal, non obstructing. Hearing intact.  Neck: Supple. Thyroid nl. Car 2+/2+ without bruits, nodes or JVD. Chest: Respirations nl with BS clear & equal w/o rales, rhonchi, wheezing or stridor.  Cor: Heart sounds normal w/ regular rate and rhythm without sig. murmurs, gallops, clicks, or rubs. Peripheral pulses normal and equal  without edema.  Abdomen: Soft & bowel sounds normal. Non-tender w/o guarding, rebound, hernias, masses, or organomegaly.  Lymphatics: Unremarkable.  Musculoskeletal: Full ROM all peripheral extremities, joint stability, 5/5 strength, and normal gait.  Skin: Warm, dry without exposed rashes, lesions or ecchymosis apparent.  Neuro: Cranial nerves intact, reflexes equal bilaterally. Sensory-motor testing grossly intact. Tendon reflexes grossly intact.  Pysch: Alert & oriented x 3.  Insight and judgement nl & appropriate. No ideations.  Assessment and Plan:  1. Essential hypertension  - TSH  2. Hyperlipidemia  - Lipid panel  3. Type 2 diabetes mellitus with diabetic chronic kidney disease  - Hemoglobin A1c - Insulin, random  4. Vitamin D deficiency  - Vit D  25 hydroxy   5. Morbid obesity (BMI 36.41)   6. ADD (attention deficit disorder)  - CONCERTA 36 MG CR tablet; Take 1 to 2 tablets daily if needed for alertness  Dispense: 60 tablet; Refill: 0  7. Medication management  - CBC with Differential/Platelet - BASIC METABOLIC PANEL WITH GFR - Hepatic function panel - Magnesium   Recommended regular exercise, BP monitoring, weight control, and discussed med and SE's. Recommended labs to assess and monitor clinical status. Further  disposition pending results of labs. Over 30 minutes of exam, counseling, chart review was performed ]

## 2015-04-20 LAB — INSULIN, RANDOM: Insulin: 5.4 u[IU]/mL (ref 2.0–19.6)

## 2015-04-20 LAB — VITAMIN D 25 HYDROXY (VIT D DEFICIENCY, FRACTURES): Vit D, 25-Hydroxy: 58 ng/mL (ref 30–100)

## 2015-06-06 ENCOUNTER — Other Ambulatory Visit: Payer: Self-pay | Admitting: Internal Medicine

## 2015-06-06 ENCOUNTER — Telehealth: Payer: Self-pay

## 2015-06-06 DIAGNOSIS — F988 Other specified behavioral and emotional disorders with onset usually occurring in childhood and adolescence: Secondary | ICD-10-CM

## 2015-06-06 MED ORDER — METHYLPHENIDATE HCL ER (OSM) 36 MG PO TBCR: EXTENDED_RELEASE_TABLET | ORAL | Status: DC

## 2015-06-06 NOTE — Telephone Encounter (Signed)
Mailed Concerta Rx to pt per pt's request.

## 2015-07-26 ENCOUNTER — Encounter: Payer: Self-pay | Admitting: Internal Medicine

## 2015-07-26 ENCOUNTER — Encounter: Payer: Self-pay | Admitting: Physician Assistant

## 2015-07-26 ENCOUNTER — Ambulatory Visit (INDEPENDENT_AMBULATORY_CARE_PROVIDER_SITE_OTHER): Payer: BLUE CROSS/BLUE SHIELD | Admitting: Physician Assistant

## 2015-07-26 VITALS — BP 130/80 | HR 102 | Temp 97.7°F | Resp 16 | Ht 68.0 in | Wt 232.0 lb

## 2015-07-26 DIAGNOSIS — F325 Major depressive disorder, single episode, in full remission: Secondary | ICD-10-CM

## 2015-07-26 DIAGNOSIS — I10 Essential (primary) hypertension: Secondary | ICD-10-CM

## 2015-07-26 DIAGNOSIS — Z79899 Other long term (current) drug therapy: Secondary | ICD-10-CM

## 2015-07-26 DIAGNOSIS — R6889 Other general symptoms and signs: Secondary | ICD-10-CM

## 2015-07-26 DIAGNOSIS — F419 Anxiety disorder, unspecified: Secondary | ICD-10-CM | POA: Diagnosis not present

## 2015-07-26 DIAGNOSIS — N182 Chronic kidney disease, stage 2 (mild): Secondary | ICD-10-CM

## 2015-07-26 DIAGNOSIS — E559 Vitamin D deficiency, unspecified: Secondary | ICD-10-CM | POA: Diagnosis not present

## 2015-07-26 DIAGNOSIS — Z0001 Encounter for general adult medical examination with abnormal findings: Secondary | ICD-10-CM

## 2015-07-26 DIAGNOSIS — E785 Hyperlipidemia, unspecified: Secondary | ICD-10-CM | POA: Diagnosis not present

## 2015-07-26 DIAGNOSIS — F988 Other specified behavioral and emotional disorders with onset usually occurring in childhood and adolescence: Secondary | ICD-10-CM

## 2015-07-26 DIAGNOSIS — M25561 Pain in right knee: Secondary | ICD-10-CM

## 2015-07-26 DIAGNOSIS — E1122 Type 2 diabetes mellitus with diabetic chronic kidney disease: Secondary | ICD-10-CM

## 2015-07-26 LAB — CBC WITH DIFFERENTIAL/PLATELET
BASOS PCT: 0 % (ref 0–1)
Basophils Absolute: 0 10*3/uL (ref 0.0–0.1)
Eosinophils Absolute: 0.1 10*3/uL (ref 0.0–0.7)
Eosinophils Relative: 2 % (ref 0–5)
HCT: 46.4 % (ref 39.0–52.0)
HEMOGLOBIN: 15.8 g/dL (ref 13.0–17.0)
Lymphocytes Relative: 27 % (ref 12–46)
Lymphs Abs: 1.9 10*3/uL (ref 0.7–4.0)
MCH: 30.7 pg (ref 26.0–34.0)
MCHC: 34.1 g/dL (ref 30.0–36.0)
MCV: 90.1 fL (ref 78.0–100.0)
MONO ABS: 0.6 10*3/uL (ref 0.1–1.0)
MONOS PCT: 9 % (ref 3–12)
MPV: 10.2 fL (ref 8.6–12.4)
NEUTROS ABS: 4.4 10*3/uL (ref 1.7–7.7)
Neutrophils Relative %: 62 % (ref 43–77)
Platelets: 315 10*3/uL (ref 150–400)
RBC: 5.15 MIL/uL (ref 4.22–5.81)
RDW: 12.8 % (ref 11.5–15.5)
WBC: 7.1 10*3/uL (ref 4.0–10.5)

## 2015-07-26 LAB — HEPATIC FUNCTION PANEL
ALBUMIN: 4.2 g/dL (ref 3.6–5.1)
ALK PHOS: 66 U/L (ref 40–115)
ALT: 19 U/L (ref 9–46)
AST: 12 U/L (ref 10–35)
BILIRUBIN TOTAL: 0.4 mg/dL (ref 0.2–1.2)
Bilirubin, Direct: 0.1 mg/dL (ref <=0.2)
Indirect Bilirubin: 0.3 mg/dL (ref 0.2–1.2)
TOTAL PROTEIN: 7.1 g/dL (ref 6.1–8.1)

## 2015-07-26 LAB — BASIC METABOLIC PANEL WITH GFR
BUN: 16 mg/dL (ref 7–25)
CALCIUM: 9.8 mg/dL (ref 8.6–10.3)
CO2: 27 mmol/L (ref 20–31)
CREATININE: 1.06 mg/dL (ref 0.70–1.25)
Chloride: 99 mmol/L (ref 98–110)
GFR, Est African American: 84 mL/min (ref >=60)
GFR, Est Non African American: 73 mL/min (ref >=60)
GLUCOSE: 261 mg/dL — AB (ref 65–99)
Potassium: 4.4 mmol/L (ref 3.5–5.3)
SODIUM: 134 mmol/L — AB (ref 135–146)

## 2015-07-26 LAB — LIPID PANEL
Cholesterol: 200 mg/dL (ref 125–200)
HDL: 66 mg/dL (ref >=40)
LDL CALC: 100 mg/dL (ref <130)
TRIGLYCERIDES: 172 mg/dL — AB (ref <150)
Total CHOL/HDL Ratio: 3 Ratio (ref <=5.0)
VLDL: 34 mg/dL — ABNORMAL HIGH (ref <30)

## 2015-07-26 LAB — HEMOGLOBIN A1C
HEMOGLOBIN A1C: 10.5 % — AB (ref <5.7)
Mean Plasma Glucose: 255 mg/dL — ABNORMAL HIGH (ref <117)

## 2015-07-26 LAB — TSH: TSH: 2.883 u[IU]/mL (ref 0.350–4.500)

## 2015-07-26 LAB — URIC ACID: URIC ACID, SERUM: 3.2 mg/dL — AB (ref 4.0–7.8)

## 2015-07-26 LAB — MAGNESIUM: MAGNESIUM: 2 mg/dL (ref 1.5–2.5)

## 2015-07-26 MED ORDER — MELOXICAM 15 MG PO TABS: ORAL_TABLET | ORAL | Status: DC

## 2015-07-26 MED ORDER — CONCERTA 36 MG PO TBCR: EXTENDED_RELEASE_TABLET | ORAL | Status: DC

## 2015-07-26 NOTE — Patient Instructions (Signed)
Diabetes is a very complicated disease...lets simplify it.  An easy way to look at it to understand the complications is if you think of the extra sugar floating in your blood stream as glass shards floating through your blood stream.    Diabetes affects your small vessels first: 1) The glass shards (sugar) scraps down the tiny blood vessels in your eyes and lead to diabetic retinopathy, the leading cause of blindness in the [place]. Diabetes is the leading cause of newly diagnosed adult (33 to 66 years of age) blindness in the [place].  2) The glass shards scratches down the tiny vessels of your legs leading to nerve damage called neuropathy and can lead to amputations of your feet. More than 60% of all non-traumatic amputations of lower limbs occur in people with diabetes.  3) Over time the small vessels in your brain are shredded and closed off, individually this does not cause any problems but over a long period of time many of the small vessels being blocked can lead to Vascular Dementia.   4) Your kidney's are a filter system and have a "net" that keeps certain things in the body and lets bad things out. Sugar shreds this net and leads to kidney damage and eventually failure. Decreasing the sugar that is destroying the net and certain blood pressure medications can help stop or decrease progression of kidney disease. Diabetes was the primary cause of kidney failure in 44 percent of all new cases in 2011.  5) Diabetes also destroys the small vessels in your penis that lead to erectile dysfunction. Eventually the vessels are so damaged that you may not be responsive to cialis or viagra.   Diabetes and your large vessels: Your larger vessels consist of your coronary arteries in your heart and the carotid vessels to your brain. Diabetes or even increased sugars put you at 300% increased risk of heart attack and stroke and this is why.. The sugar scrapes down your large blood vessels and your body  sees this as an internal injury and tries to repair itself. Just like you get a scab on your skin, your platelets will stick to the blood vessel wall trying to heal it. This is why we have diabetics on low dose aspirin daily, this prevents the platelets from sticking and can prevent plaque formation. In addition, your body takes cholesterol and tries to shove it into the open wound. This is why we want your LDL, or bad cholesterol, below 70.   The combination of platelets and cholesterol over 5-10 years forms plaque that can break off and cause a heart attack or stroke.   PLEASE REMEMBER:  Diabetes is preventable! Up to 59 percent of complications and morbidities among individuals with type 2 diabetes can be prevented, delayed, or effectively treated and minimized with regular visits to a health professional, appropriate monitoring and medication, and a healthy diet and lifestyle.     Bad carbs also include fruit juice, alcohol, and sweet tea. These are empty calories that do not signal to your brain that you are full.   Please remember the good carbs are still carbs which convert into sugar. So please measure them out no more than 1/2-1 cup of rice, oatmeal, pasta, and beans  Veggies are however free foods! Pile them on.   Not all fruit is created equal. Please see the list below, the fruit at the bottom is higher in sugars than the fruit at the top. Please avoid all dried fruits.  Knee Pain Knee pain is a very common symptom and can have many causes. Knee pain often goes away when you follow your health care provider's instructions for relieving pain and discomfort at home. However, knee pain can develop into a condition that needs treatment. Some conditions may include: 5. Arthritis caused by wear and tear (osteoarthritis). 6. Arthritis caused by swelling and irritation (rheumatoid arthritis or gout). 7. A cyst or growth in your knee. 8. An infection in your knee joint. 9. An injury  that will not heal. 10. Damage, swelling, or irritation of the tissues that support your knee (torn ligaments or tendinitis). If your knee pain continues, additional tests may be ordered to diagnose your condition. Tests may include X-rays or other imaging studies of your knee. You may also need to have fluid removed from your knee. Treatment for ongoing knee pain depends on the cause, but treatment may include:  Medicines to relieve pain or swelling.  Steroid injections in your knee.  Physical therapy.  Surgery. HOME CARE INSTRUCTIONS  Take medicines only as directed by your health care provider.  Rest your knee and keep it raised (elevated) while you are resting.  Do not do things that cause or worsen pain.  Avoid high-impact activities or exercises, such as running, jumping rope, or doing jumping jacks.  Apply ice to the knee area:  Put ice in a plastic bag.  Place a towel between your skin and the bag.  Leave the ice on for 20 minutes, 2-3 times a day.  Ask your health care provider if you should wear an elastic knee support.  Keep a pillow under your knee when you sleep.  Lose weight if you are overweight. Extra weight can put pressure on your knee.  Do not use any tobacco products, including cigarettes, chewing tobacco, or electronic cigarettes. If you need help quitting, ask your health care provider. Smoking may slow the healing of any bone and joint problems that you may have. SEEK MEDICAL CARE IF:  Your knee pain continues, changes, or gets worse.  You have a fever along with knee pain.  Your knee buckles or locks up.  Your knee becomes more swollen. SEEK IMMEDIATE MEDICAL CARE IF:   Your knee joint feels hot to the touch.  You have chest pain or trouble breathing.   This information is not intended to replace advice given to you by your health care provider. Make sure you discuss any questions you have with your health care provider.   Document  Released: 05/13/2007 Document Revised: 08/06/2014 Document Reviewed: 03/01/2014 Elsevier Interactive Patient Education [employer].

## 2015-07-26 NOTE — Progress Notes (Signed)
Complete Physical  Assessment and Plan: 1. Essential hypertension - continue medications, DASH diet, exercise and monitor at home. Call if greater than 130/80.  - CBC with Differential/Platelet - BASIC METABOLIC PANEL WITH GFR - Hepatic function panel - TSH  2. Type 2 diabetes mellitus with stage 2 chronic kidney disease, without long-term current use of insulin ([place]) Discussed general issues about diabetes pathophysiology and management., Educational material distributed., Suggested low cholesterol diet., Encouraged aerobic exercise., Discussed foot care., Reminded to get yearly retinal exam. GET EYE EXAM Discussed if A1C is not better will add on glipizide - Hemoglobin A1c  3. Morbid obesity, unspecified obesity type ([place]) Obesity with co morbidities- long discussion about weight loss, diet, and exercise  4. Hyperlipidemia -continue medications, check lipids, decrease fatty foods, increase activity.  - Lipid panel  5. Vitamin D deficiency - VITAMIN D 25 Hydroxy (Vit-D Deficiency, Fractures)  6. Medication management - Magnesium  7. 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 Anxiety controlled  9. ADD (attention deficit disorder) - CONCERTA 36 MG CR tablet; Take 1 to 2 tablets daily if needed for alertness  Dispense: 60 tablet; Refill: 0  10. Encounter for general adult medical examination with abnormal findings - CBC with Differential/Platelet - BASIC METABOLIC PANEL WITH GFR - Hepatic function panel - TSH - Lipid panel - Hemoglobin A1c - Magnesium - VITAMIN D 25 Hydroxy (Vit-D Deficiency, Fractures) - Uric acid  11. Right knee pain Stable knee, likely OA- will treat conservatively, if not better will get Xray/refer to ortho - Uric acid - meloxicam (MOBIC) 15 MG tablet; Take one daily with food for 2 weeks, can take with tylenol, can not take with aleve, iburpofen, then as needed daily for pain  Dispense: 30 tablet; Refill: 1  Discussed med's effects  and SE's. Screening labs and tests as requested with regular follow-up as recommended. Over 40 minutes of exam, counseling, chart review and critical decision making was performed  Future Appointments Date Time Provider [place]  10/25/2015 11:15 AM Unk Pinto, MD [facility] None     HPI 64 y.o. WM presents for a complete physical.   His blood pressure has been controlled at home, today their BP is BP: 130/80 mmHg He does not workout. He denies chest pain, shortness of breath, dizziness.  He is on cholesterol medication and denies myalgias. His cholesterol is at goal. The cholesterol last visit was:   Lab Results  Component Value Date   CHOL 167 04/19/2015   HDL 59 04/19/2015   LDLCALC 71 04/19/2015   TRIG 186* 04/19/2015   CHOLHDL 2.8 04/19/2015   He has been working on diet and exercise for diabetes with CKD (x 2012), he does check his sugars 1x a week at home, runs around 100, he is on bASA, he is on ACE/ARB and denies paresthesia of the feet, polydipsia, polyuria and visual disturbances. Last A1C in the office was:  Lab Results  Component Value Date   HGBA1C 9.1* 04/19/2015  Last GFR: Lab Results  Component Value Date   GFRNONAA 72 04/19/2015  Patient is on Vitamin D supplement.   Lab Results  Component Value Date   VD25OH 58 04/19/2015     Last PSA was: Lab Results  Component Value Date   PSA 2.07 09/16/2014   BMI is Body mass index is 35.28 kg/(m^2)., he is working on diet and exercise. Wt Readings from Last 3 Encounters:  07/26/15 232 lb (105.235 kg)  04/19/15 230 lb 12.8 oz (104.69  kg)  02/04/15 233 lb (105.688 kg)   Patient complains of right knee swelling x several months. States he was going up steps at his house and his right knee "gave way", he took a few days off from work but it has continues to bother him. Denies pain at this time, warmth, redness.   Current Medications:  Current Outpatient Prescriptions on File Prior to Visit   Medication Sig Dispense Refill  . albuterol (PROVENTIL HFA;VENTOLIN HFA) 108 (90 BASE) MCG/ACT inhaler Inhale 2 puffs into the lungs every 6 (six) hours as needed for wheezing or shortness of breath. 1 Inhaler 2  . ergocalciferol (VITAMIN D2) 50000 UNITS capsule Take 1 capsule (50,000 Units total) by mouth 3 (three) times a week. 12 capsule PRN  . metFORMIN (GLUCOPHAGE-XR) 500 MG 24 hr tablet Take 4 tablets (2,000 mg total) by mouth daily. 360 tablet 3  . methylphenidate (CONCERTA) 36 MG PO CR tablet Take 1 tablet  2 x daily if needed for ADD 60 tablet 0  . olmesartan (BENICAR) 40 MG tablet Take 1 tablet (40 mg total) by mouth daily. 30 tablet 3  . rosuvastatin (CRESTOR) 20 MG tablet TAKE ONE TABLET BY MOUTH ONCE DAILY FOR CHOLESTEROL 30 tablet 6  . CONCERTA 36 MG CR tablet Take 1 to 2 tablets daily if needed for alertness (Patient taking differently: Take 1  2X DAILY IF NEEDED FOR ADD) 60 tablet 0   No current facility-administered medications on file prior to visit.   Health Maintenance:  Immunization History  Administered Date(s) Administered  . DT 09/16/2014  . PPD Test 09/16/2014  . Pneumococcal-Unspecified 07/31/1999  . Td 07/31/2003  . Zoster 05/26/2013    Tetanus: 2016 Pneumovax: 2001 Prevnar 13: out of in the office Flu vaccine: declines Zostavax: 2014 DEXA: Colonoscopy: 2013 Dr. Henrene Pastor EGD:  Eye exam: 3 years ago, [facility]  Patient Care Team: Unk Pinto, MD as PCP - General (Internal Medicine)  Allergies:  Allergies  Allergen Reactions  . Adderall [Amphetamine-Dextroamphet Er]     MOOD CHANGE  . Lipitor [Atorvastatin]     MYALGIA  . Wellbutrin [Bupropion]     HA  . Zoloft [Sertraline Hcl]     AGGITATION   Medical History:  Past Medical History  Diagnosis Date  . Hyperlipidemia   . Hypertension   . Anxiety   . Depression   . Diabetes mellitus without complication ([place])   . ADD (attention deficit disorder)   . Unspecified vitamin D  deficiency    Surgical History: No past surgical history on file. Family History:  Family History  Problem Relation Age of Onset  . Hypertension Mother   . Cancer Mother     BREAST  . Hypertension Father   . Hypertension Sister   . Hyperlipidemia Sister    Social History:   Social History  Substance Use Topics  . Smoking status: Former Smoker    Quit date: 09/30/1963  . Smokeless tobacco: Current User    Types: Chew     Comment: Cigars  . Alcohol Use: No   Review of Systems:  Review of Systems  Constitutional: Negative.   HENT: Negative.   Eyes: Negative.   Respiratory: Negative.   Cardiovascular: Negative.   Gastrointestinal: Negative.   Genitourinary: Negative.   Musculoskeletal: Positive for joint pain ( right knee pain). Negative for myalgias, back pain, falls and neck pain.  Skin: Negative.   Neurological: Negative.   Endo/Heme/Allergies: Negative.   Psychiatric/Behavioral: Negative.  Physical Exam: Estimated body mass index is 35.28 kg/(m^2) as calculated from the following:   Height as of this encounter: 5\' 8"  (1.727 m).   Weight as of this encounter: 232 lb (105.235 kg). BP 130/80 mmHg  Pulse 102  Temp(Src) 97.7 F (36.5 C) (Temporal)  Resp 16  Ht 5\' 8"  (1.727 m)  Wt 232 lb (105.235 kg)  BMI 35.28 kg/m2  SpO2 92% General Appearance: Well nourished, in no apparent distress.  Eyes: PERRLA, EOMs, conjunctiva no swelling or erythema, normal fundi and vessels.  Sinuses: No Frontal/maxillary tenderness  ENT/Mouth: Ext aud canals clear, normal light reflex with TMs without erythema, bulging. Good dentition. No erythema, swelling, or exudate on post pharynx. Tonsils not swollen or erythematous. Hearing normal.  Neck: Supple, thyroid normal. No bruits  Respiratory: Respiratory effort normal, BS equal bilaterally without rales, rhonchi, wheezing or stridor.  Cardio: RRR without murmurs, rubs or gallops. Brisk peripheral pulses without edema.  Chest:  symmetric, with normal excursions and percussion.  Abdomen: Soft, nontender, no guarding, rebound, hernias, masses, or organomegaly.  Lymphatics: Non tender without lymphadenopathy.  Genitourinary: defer Musculoskeletal: Full ROM all peripheral extremities,5/5 strength, and normal gait. right knee with crepitus no effusion, no erythema, no tenderness, ACL stable, no patellar laxity, McMurray's negative and FROM Skin: Warm, dry without rashes, lesions, ecchymosis. Neuro: Cranial nerves intact, reflexes equal bilaterally. Normal muscle tone, no cerebellar symptoms. Sensation intact.  Psych: Awake and oriented X 3, normal affect, Insight and Judgment appropriate.   EKG: defer AORTA SCAN:defer  Vicie Mutters 8:48 AM [facility]

## 2015-07-27 LAB — VITAMIN D 25 HYDROXY (VIT D DEFICIENCY, FRACTURES): VIT D 25 HYDROXY: 45 ng/mL (ref 30–100)

## 2015-07-27 MED ORDER — GLIPIZIDE 5 MG PO TABS: 5.0000 mg | ORAL_TABLET | Freq: Every day | ORAL | Status: DC

## 2015-07-27 NOTE — Addendum Note (Signed)
Addended by: Vicie Mutters R on: 07/27/2015 07:41 AM   Modules accepted: Orders

## 2015-09-09 ENCOUNTER — Ambulatory Visit (INDEPENDENT_AMBULATORY_CARE_PROVIDER_SITE_OTHER): Payer: BLUE CROSS/BLUE SHIELD | Admitting: Physician Assistant

## 2015-09-09 ENCOUNTER — Encounter: Payer: Self-pay | Admitting: Physician Assistant

## 2015-09-09 VITALS — BP 124/80 | HR 67 | Temp 97.7°F | Resp 16 | Ht 68.0 in | Wt 231.0 lb

## 2015-09-09 DIAGNOSIS — E785 Hyperlipidemia, unspecified: Secondary | ICD-10-CM

## 2015-09-09 DIAGNOSIS — N182 Chronic kidney disease, stage 2 (mild): Secondary | ICD-10-CM

## 2015-09-09 DIAGNOSIS — F909 Attention-deficit hyperactivity disorder, unspecified type: Secondary | ICD-10-CM

## 2015-09-09 DIAGNOSIS — E1122 Type 2 diabetes mellitus with diabetic chronic kidney disease: Secondary | ICD-10-CM | POA: Diagnosis not present

## 2015-09-09 DIAGNOSIS — F988 Other specified behavioral and emotional disorders with onset usually occurring in childhood and adolescence: Secondary | ICD-10-CM

## 2015-09-09 MED ORDER — ROSUVASTATIN CALCIUM 20 MG PO TABS: ORAL_TABLET | ORAL | Status: DC

## 2015-09-09 MED ORDER — CONCERTA 36 MG PO TBCR: EXTENDED_RELEASE_TABLET | ORAL | Status: DC

## 2015-09-09 MED ORDER — METFORMIN HCL ER 500 MG PO TB24: 2000.0000 mg | ORAL_TABLET | Freq: Every day | ORAL | Status: DC

## 2015-09-09 MED ORDER — SILDENAFIL CITRATE 100 MG PO TABS: 100.0000 mg | ORAL_TABLET | ORAL | Status: DC | PRN

## 2015-09-09 MED ORDER — OLMESARTAN MEDOXOMIL 40 MG PO TABS: 40.0000 mg | ORAL_TABLET | Freq: Every day | ORAL | Status: DC

## 2015-09-09 MED ORDER — CONCERTA 36 MG PO TBCR
EXTENDED_RELEASE_TABLET | ORAL | Status: DC
Start: 2015-09-09 — End: 2015-09-09

## 2015-09-09 NOTE — Progress Notes (Signed)
Diabetes Education and Follow-Up Visit  65 y.o.male presents for diabetic education. Last visit his A1c was 10.5, he was started on glipzide 5mg , has been taking it with a smoothie in the morning. Patient denies paresthesia of the feet, polydipsia, polyuria and visual disturbances. The patient is on an ACE/ARB, he is on a low dose aspirin at this time. The patient does not exercise. He does not currently smoke, and does not currently drink alcohol.  The patient is checking his sugars at home.    ROS: no polyuria or polydipsia, no chest pain, dyspnea or TIA's, no numbness, tingling or pain in extremities  Physical Exam: Blood pressure 124/80, pulse 67, temperature 97.7 F (36.5 C), resp. rate 16, weight 231 lb (104.781 kg), SpO2 99 %. Body mass index is 35.13 kg/(m^2). General Appearance:  alert, oriented, no acute distress and obese heart sounds regular rate and rhythm, S1, S2 normal, no murmur, click, rub or gallop, chest clear, no hepatosplenomegaly  Labs: Lab Results  Component Value Date   HGBA1C 10.5* 07/26/2015    Lab Results  Component Value Date   MICROALBUR 2.1* 09/16/2014    Lab Results  Component Value Date   CHOL 200 07/26/2015   HDL 66 07/26/2015   LDLCALC 100 07/26/2015   TRIG 172* 07/26/2015   CHOLHDL 3.0 07/26/2015    Wt Readings from Last 3 Encounters:  09/09/15 231 lb (104.781 kg)  07/26/15 232 lb (105.235 kg)  04/19/15 230 lb 12.8 oz (104.69 kg)    Plan and Assessment: Diabetes Mellitus type 2:  Diabetes mellitus Type II, under poor control. Diabetes related complications: nephropathy and impotence  GET EYE EXAM CONTINUE GLIPIZIDE CHECK SUGARS AT HOME Education: Reviewed 'ABCs' of diabetes management (respective goals in parentheses):  A1C (<7), blood pressure (<130/80), and cholesterol (LDL <100) Eye Exam yearly and Dental Exam every 6 months. Dietary recommendations Physical Activity recommendations  Compliance at present is estimated to be fair.     Blood pressure: normal blood pressure.   An ACE/ARB is currently part of their treatment regimen.  LDL goal of < 100, HDL > 40 and TG < 150. Dyslipidemia under fair control. A statin is currently part of their treatment regimen.   Future Appointments Date Time Provider [place]  10/28/2015 9:00 AM Unk Pinto, MD [facility] None

## 2015-09-09 NOTE — Patient Instructions (Signed)
If your morning sugar is always below 100 then the issue is with your sugar spiking after meals. Try to take your blood sugar approximately 2 hours after eating, this number should be less than 200. If it is not, think about the foods that you ate and better choices you can make.    Your A1C is a measure of your sugar over the past 3 months and is not affected by what you have eaten over the past few days. Diabetes increases your chances of stroke and heart attack over 300 % and is the leading cause of blindness and kidney failure in the [place]. Please make sure you decrease bad carbs like white bread, white rice, potatoes, corn, soft drinks, pasta, cereals, refined sugars, sweet tea, dried fruits, and fruit juice. Good carbs are okay to eat in moderation like sweet potatoes, brown rice, whole grain pasta/bread, most fruit (except dried fruit) and you can eat as many veggies as you want.   Greater than 6.5 is considered diabetic. Between 6.4 and 5.7 is prediabetic If your A1C is less than 5.7 you are NOT diabetic.  Targets for Glucose Readings: Time of Check Target for patients WITHOUT Diabetes Target for DIABETICS  Before Meals Less than 100  less than 150  Two hours after meals Less than 200  Less than 250    We want weight loss that will last so you should lose 1-2 pounds a week.  THAT IS IT! Please pick THREE things a month to change. Once it is a habit check off the item. Then pick another three items off the list to become habits.  If you are already doing a habit on the list GREAT!  Cross that item off! o Don't drink your calories. Ie, alcohol, soda, fruit juice, and sweet tea.  o Drink more water. Drink a glass when you feel hungry or before each meal.  o Eat breakfast - Complex carb and protein (likeDannon light and fit yogurt, oatmeal, fruit, eggs, [place] bacon). o Measure your cereal.  Eat no more than one cup a day. (ie [place]) o Eat an apple a day. o Add a vegetable a  day. o Try a new vegetable a month. o Use Pam! Stop using oil or butter to cook. o Don't finish your plate or use smaller plates. o Share your dessert. o Eat sugar free Jello for dessert or frozen grapes. o Don't eat 2-3 hours before bed. o Switch to whole wheat bread, pasta, and brown rice. o Make healthier choices when you eat out. No fries! o Pick baked chicken, NOT fried. o Don't forget to SLOW DOWN when you eat. It is not going anywhere.  o Take the stairs. o Park far away in the parking lot o [employer] (or weights) for 10 minutes while watching TV. o Walk at work for 10 minutes during break. o Walk outside 1 time a week with your friend, kids, dog, or significant other. o Start a walking group at [place] the mall as much as you can tolerate.  o Keep a food diary. o Weigh yourself daily. o Walk for 15 minutes 3 days per week. o Cook at home more often and eat out less.  If life happens and you go back to old habits, it is okay.  Just start over. You can do it!   If you experience chest pain, get short of breath, or tired during the exercise, please stop immediately and inform your doctor.  Diabetes is a very complicated disease...lets simplify it.  An easy way to look at it to understand the complications is if you think of the extra sugar floating in your blood stream as glass shards floating through your blood stream.    Diabetes affects your small vessels first: 1) The glass shards (sugar) scraps down the tiny blood vessels in your eyes and lead to diabetic retinopathy, the leading cause of blindness in the [place]. Diabetes is the leading cause of newly diagnosed adult (41 to 67 years of age) blindness in the [place].  2) The glass shards scratches down the tiny vessels of your legs leading to nerve damage called neuropathy and can lead to amputations of your feet. More than 60% of all non-traumatic amputations of lower limbs occur in people with  diabetes.  3) Over time the small vessels in your brain are shredded and closed off, individually this does not cause any problems but over a long period of time many of the small vessels being blocked can lead to Vascular Dementia.   4) Your kidney's are a filter system and have a "net" that keeps certain things in the body and lets bad things out. Sugar shreds this net and leads to kidney damage and eventually failure. Decreasing the sugar that is destroying the net and certain blood pressure medications can help stop or decrease progression of kidney disease. Diabetes was the primary cause of kidney failure in 44 percent of all new cases in 2011.  5) Diabetes also destroys the small vessels in your penis that lead to erectile dysfunction. Eventually the vessels are so damaged that you may not be responsive to cialis or viagra.   Diabetes and your large vessels: Your larger vessels consist of your coronary arteries in your heart and the carotid vessels to your brain. Diabetes or even increased sugars put you at 300% increased risk of heart attack and stroke and this is why.. The sugar scrapes down your large blood vessels and your body sees this as an internal injury and tries to repair itself. Just like you get a scab on your skin, your platelets will stick to the blood vessel wall trying to heal it. This is why we have diabetics on low dose aspirin daily, this prevents the platelets from sticking and can prevent plaque formation. In addition, your body takes cholesterol and tries to shove it into the open wound. This is why we want your LDL, or bad cholesterol, below 70.   The combination of platelets and cholesterol over 5-10 years forms plaque that can break off and cause a heart attack or stroke.   PLEASE REMEMBER:  Diabetes is preventable! Up to 80 percent of complications and morbidities among individuals with type 2 diabetes can be prevented, delayed, or effectively treated and minimized  with regular visits to a health professional, appropriate monitoring and medication, and a healthy diet and lifestyle.     Bad carbs also include fruit juice, alcohol, and sweet tea. These are empty calories that do not signal to your brain that you are full.   Please remember the good carbs are still carbs which convert into sugar. So please measure them out no more than 1/2-1 cup of rice, oatmeal, pasta, and beans  Veggies are however free foods! Pile them on.   Not all fruit is created equal. Please see the list below, the fruit at the bottom is higher in sugars than the fruit at the top. Please avoid all dried  fruits.

## 2015-09-30 ENCOUNTER — Encounter: Payer: Self-pay | Admitting: Internal Medicine

## 2015-10-25 ENCOUNTER — Encounter: Payer: Self-pay | Admitting: Internal Medicine

## 2015-10-28 ENCOUNTER — Ambulatory Visit (INDEPENDENT_AMBULATORY_CARE_PROVIDER_SITE_OTHER): Payer: BLUE CROSS/BLUE SHIELD | Admitting: Internal Medicine

## 2015-10-28 ENCOUNTER — Encounter: Payer: Self-pay | Admitting: Internal Medicine

## 2015-10-28 VITALS — BP 124/82 | HR 76 | Temp 97.3°F | Resp 16 | Ht 67.0 in | Wt 234.0 lb

## 2015-10-28 DIAGNOSIS — Z1212 Encounter for screening for malignant neoplasm of rectum: Secondary | ICD-10-CM

## 2015-10-28 DIAGNOSIS — Z136 Encounter for screening for cardiovascular disorders: Secondary | ICD-10-CM | POA: Diagnosis not present

## 2015-10-28 DIAGNOSIS — E785 Hyperlipidemia, unspecified: Secondary | ICD-10-CM

## 2015-10-28 DIAGNOSIS — R5383 Other fatigue: Secondary | ICD-10-CM

## 2015-10-28 DIAGNOSIS — I10 Essential (primary) hypertension: Secondary | ICD-10-CM

## 2015-10-28 DIAGNOSIS — E559 Vitamin D deficiency, unspecified: Secondary | ICD-10-CM | POA: Diagnosis not present

## 2015-10-28 DIAGNOSIS — Z111 Encounter for screening for respiratory tuberculosis: Secondary | ICD-10-CM

## 2015-10-28 DIAGNOSIS — Z0000 Encounter for general adult medical examination without abnormal findings: Secondary | ICD-10-CM

## 2015-10-28 DIAGNOSIS — E1122 Type 2 diabetes mellitus with diabetic chronic kidney disease: Secondary | ICD-10-CM

## 2015-10-28 DIAGNOSIS — Z79899 Other long term (current) drug therapy: Secondary | ICD-10-CM | POA: Diagnosis not present

## 2015-10-28 DIAGNOSIS — F988 Other specified behavioral and emotional disorders with onset usually occurring in childhood and adolescence: Secondary | ICD-10-CM

## 2015-10-28 DIAGNOSIS — N182 Chronic kidney disease, stage 2 (mild): Secondary | ICD-10-CM

## 2015-10-28 DIAGNOSIS — Z125 Encounter for screening for malignant neoplasm of prostate: Secondary | ICD-10-CM

## 2015-10-28 DIAGNOSIS — Z0001 Encounter for general adult medical examination with abnormal findings: Secondary | ICD-10-CM

## 2015-10-28 LAB — MAGNESIUM: Magnesium: 2 mg/dL (ref 1.5–2.5)

## 2015-10-28 LAB — TSH: TSH: 3.06 m[IU]/L (ref 0.40–4.50)

## 2015-10-28 LAB — CBC WITH DIFFERENTIAL/PLATELET
BASOS ABS: 0 10*3/uL (ref 0.0–0.1)
Basophils Relative: 0 % (ref 0–1)
EOS ABS: 0.2 10*3/uL (ref 0.0–0.7)
Eosinophils Relative: 3 % (ref 0–5)
HEMATOCRIT: 44.2 % (ref 39.0–52.0)
HEMOGLOBIN: 15.4 g/dL (ref 13.0–17.0)
LYMPHS PCT: 29 % (ref 12–46)
Lymphs Abs: 2 10*3/uL (ref 0.7–4.0)
MCH: 32.1 pg (ref 26.0–34.0)
MCHC: 34.8 g/dL (ref 30.0–36.0)
MCV: 92.1 fL (ref 78.0–100.0)
MPV: 9.7 fL (ref 8.6–12.4)
Monocytes Absolute: 0.6 10*3/uL (ref 0.1–1.0)
Monocytes Relative: 8 % (ref 3–12)
NEUTROS ABS: 4.2 10*3/uL (ref 1.7–7.7)
Neutrophils Relative %: 60 % (ref 43–77)
Platelets: 302 10*3/uL (ref 150–400)
RBC: 4.8 MIL/uL (ref 4.22–5.81)
RDW: 13.2 % (ref 11.5–15.5)
WBC: 7 10*3/uL (ref 4.0–10.5)

## 2015-10-28 LAB — BASIC METABOLIC PANEL WITH GFR
BUN: 17 mg/dL (ref 7–25)
CALCIUM: 8.7 mg/dL (ref 8.6–10.3)
CO2: 22 mmol/L (ref 20–31)
Chloride: 104 mmol/L (ref 98–110)
Creat: 1.1 mg/dL (ref 0.70–1.25)
GFR, Est African American: 80 mL/min (ref >=60)
GFR, Est Non African American: 69 mL/min (ref >=60)
GLUCOSE: 228 mg/dL — AB (ref 65–99)
POTASSIUM: 4.6 mmol/L (ref 3.5–5.3)
Sodium: 136 mmol/L (ref 135–146)

## 2015-10-28 LAB — HEMOGLOBIN A1C
HEMOGLOBIN A1C: 8 % — AB (ref <5.7)
MEAN PLASMA GLUCOSE: 183 mg/dL

## 2015-10-28 LAB — HEPATIC FUNCTION PANEL
ALBUMIN: 4.2 g/dL (ref 3.6–5.1)
ALK PHOS: 86 U/L (ref 40–115)
ALT: 20 U/L (ref 9–46)
AST: 17 U/L (ref 10–35)
Bilirubin, Direct: 0.1 mg/dL (ref <=0.2)
Indirect Bilirubin: 0.2 mg/dL (ref 0.2–1.2)
TOTAL PROTEIN: 6.3 g/dL (ref 6.1–8.1)
Total Bilirubin: 0.3 mg/dL (ref 0.2–1.2)

## 2015-10-28 LAB — LIPID PANEL
Cholesterol: 145 mg/dL (ref 125–200)
HDL: 51 mg/dL (ref >=40)
LDL CALC: 26 mg/dL (ref <130)
TRIGLYCERIDES: 342 mg/dL — AB (ref <150)
Total CHOL/HDL Ratio: 2.8 Ratio (ref <=5.0)
VLDL: 68 mg/dL — AB (ref <30)

## 2015-10-28 NOTE — Progress Notes (Signed)
Patient ID: Joseph Campbell, male   DOB: 1951/01/13, 65 y.o.   MRN: [id number]  Annual  Screening/Preventative Visit And Comprehensive Evaluation & Examination  This very nice 65 y.o. [name] presents for a Wellness/Preventative Visit & comprehensive evaluation and management of multiple medical co-morbidities.  Patient has been followed for HTN, T2_NIDDM  , Hyperlipidemia and Vitamin D Deficiency. Patient also has hx/o Adult ADD and has apparent good response with [place] maintaining his concentration and alertness when driving .     HTN predates since circa 1990's. Patient's BP has been controlled at home.Today's BP: 124/82 mmHg. Patient denies any cardiac symptoms as chest pain, palpitations, shortness of breath, dizziness or ankle swelling.   Patient's hyperlipidemia is controlled with diet and medications. Patient denies myalgias or other medication SE's. Last lipids were at goal with Cholesterol 200; HDL 66; LDL 100; & sl elevated Triglycerides 172 on 07/26/2015.   Patient has Morbid Obesity (BMI 36+) and consequent T2_NIDDM  & CKD 2 since 2010 with A1c 2010  and patient denies reactive hypoglycemic symptoms, visual blurring, diabetic polys or paresthesias. Last A1c was not at goal due to his poor dietary compliance with A1c 10.5% 07/26/2015.   Finally, patient has history of Vitamin D Deficiency of "26" and last vitamin D was still low at 45 on 07/26/2015.   Medication Sig  . albuterol  HFA  inhaler Inhale 2 puffs into the lungs every 6 (six) hours as needed for wheezing or shortness of breath.  . CONCERTA 36 MG CR tablet Take 1 to 2 tablets daily if needed for alertness  . VITAMIN D 50,000 UNITS capsule Take 1 capsule (50,000 Units total) by mouth 3 (three) times a week.  Marland Kitchen glipiZIDE  5 MG tablet Take 1 tablet (5 mg total) by mouth daily before breakfast.  . metFORMIN-XR 500 MG 24 hr tablet Take 4 tablets (2,000 mg total) by mouth daily.  Marland Kitchen olmesartan 40 MG tablet Take 1 tablet (40 mg total)  by mouth daily.  . rosuvastatin  20 MG tablet TAKE ONE TABLET BY MOUTH ONCE DAILY FOR CHOLESTEROL  . sildenafil  100 MG tablet Take 1 tablet (100 mg total) by mouth as needed for erectile dysfunction.   Allergies  Allergen Reactions  . Adderall [Amphetamine-Dextroamphet Er]     MOOD CHANGE  . Lipitor [Atorvastatin]     MYALGIA  . Wellbutrin [Bupropion]     HA  . Zoloft [Sertraline Hcl]     AGGITATION   Past Medical History  Diagnosis Date  . Hyperlipidemia   . Hypertension   . Anxiety   . Depression   . Diabetes mellitus without complication ([place])   . ADD (attention deficit disorder)   . Unspecified vitamin D deficiency    Health Maintenance  Topic Date Due  . Hepatitis C Screening  May 09, 1951  . OPHTHALMOLOGY EXAM  06/13/2013  . PNA vac Low Risk Adult (1 of 2 - PCV13) 09/26/2013  . FOOT EXAM  09/17/2015  . INFLUENZA VACCINE  05/17/2019 (Originally 02/28/2015)  . HEMOGLOBIN A1C  01/24/2016  . COLONOSCOPY  06/12/2021  . TETANUS/TDAP  09/16/2024  . ZOSTAVAX  Completed   Immunization History  Administered Date(s) Administered  . DT 09/16/2014  . PPD Test 09/16/2014, 10/28/2015  . Pneumococcal-Unspecified 07/31/1999  . Td 07/31/2003  . Zoster 05/26/2013   No past surgical history on file. Family History  Problem Relation Age of Onset  . Hypertension Mother   . Cancer Mother     BREAST  .  Hypertension Father   . Hypertension Sister   . Hyperlipidemia Sister     Social History   Social History  . Marital Status: Married    Spouse Name: N/A  . Number of Children: N/A  . Years of Education: N/A   Occupational History  . Oil tanker driver   Social History Main Topics  . Smoking status: Former Smoker    Quit date: 09/30/1963  . Smokeless tobacco: Current User    Types: Chew     Comment: Cigars  . Alcohol Use: No  . Drug Use: No  . Sexual Activity: Not on file    ROS Constitutional: Denies fever, chills, weight loss/gain, headaches, insomnia,  night  sweats or change in appetite. Does c/o fatigue. Eyes: Denies redness, blurred vision, diplopia, discharge, itchy or watery eyes.  ENT: Denies discharge, congestion, post nasal drip, epistaxis, sore throat, earache, hearing loss, dental pain, Tinnitus, Vertigo, Sinus pain or snoring.  Cardio: Denies chest pain, palpitations, irregular heartbeat, syncope, dyspnea, diaphoresis, orthopnea, PND, claudication or edema Respiratory: denies cough, dyspnea, DOE, pleurisy, hoarseness, laryngitis or wheezing.  Gastrointestinal: Denies dysphagia, heartburn, reflux, water brash, pain, cramps, nausea, vomiting, bloating, diarrhea, constipation, hematemesis, melena, hematochezia, jaundice or hemorrhoids Genitourinary: Denies dysuria, frequency, urgency, nocturia, hesitancy, discharge, hematuria or flank pain Musculoskeletal: Denies arthralgia, myalgia, stiffness, Jt. Swelling, pain, limp or strain/sprain. Denies Falls. Skin: Denies puritis, rash, hives, warts, acne, eczema or change in skin lesion Neuro: No weakness, tremor, incoordination, spasms, paresthesia or pain Psychiatric: Denies confusion, memory loss or sensory loss. Denies Depression. Endocrine: Denies change in weight, skin, hair change, nocturia, and paresthesia, diabetic polys, visual blurring or hyper / hypo glycemic episodes.  Heme/Lymph: No excessive bleeding, bruising or enlarged lymph nodes.  Physical Exam  BP 124/82 mmHg  Pulse 76  Temp(Src) 97.3 F (36.3 C)  Resp 16  Ht 5\' 7"  (1.702 m)  Wt 234 lb (106.142 kg)  BMI 36.64 kg/m2  General Appearance: Over nourished with Central Obesity and in no apparent distress. Eyes: PERRLA, EOMs, conjunctiva no swelling or erythema, normal fundi and vessels. Sinuses: No frontal/maxillary tenderness ENT/Mouth: EACs patent / TMs  nl. Nares clear without erythema, swelling, mucoid exudates. Oral hygiene is good. No erythema, swelling, or exudate. Tongue normal, non-obstructing. Tonsils not swollen or  erythematous. Hearing normal.  Neck: Supple, thyroid normal. No bruits, nodes or JVD. Respiratory: Respiratory effort normal.  BS equal and clear bilateral without rales, rhonci, wheezing or stridor. Cardio: Heart sounds are normal with regular rate and rhythm and no murmurs, rubs or gallops. Peripheral pulses are normal and equal bilaterally without edema. No aortic or femoral bruits. Chest: symmetric with normal excursions and percussion.  Abdomen: Soft, obese with Nl bowel sounds. Nontender, no guarding, rebound, hernias, masses, or organomegaly.  Lymphatics: Non tender without lymphadenopathy.  Genitourinary: No hernias.Testes nl. DRE - prostate nl for age - smooth & firm w/o nodules. Musculoskeletal: Full ROM all peripheral extremities, joint stability, 5/5 strength, and normal gait. Skin: Warm and dry without rashes, lesions, cyanosis, clubbing or  ecchymosis.  Neuro: Cranial nerves intact, reflexes equal bilaterally. Normal muscle tone, no cerebellar symptoms. Sensation intact to touch, Vibratory & Monofilament to the toes bilaterally.  Pysch: Alert and oriented X 3 with normal affect, insight and judgment appropriate.   Assessment and Plan  1. Annual Preventative/Screening Exam   - Microalbumin / creatinine urine ratio - EKG 12-Lead - [place], RETROPERITNL ABD,  LTD - POC Hemoccult Bld/Stl  - Urinalysis, Routine w reflex microscopic  -  PSA - CBC with Differential/Platelet - BASIC METABOLIC PANEL WITH GFR - Hepatic function panel - Magnesium - Lipid panel - TSH - Hemoglobin A1c - Insulin, random - VITAMIN D 25 Hydroxy  - HM DIABETES FOOT EXAM - LOW EXTREMITY NEUR EXAM DOCUM  2. Essential hypertension  - EKG 12-Lead - [place], RETROPERITNL ABD,  LTD - TSH  3. Hyperlipidemia  - Lipid panel - TSH  4. Vitamin D deficiency  - VITAMIN D 25 Hydroxy   5. Type 2 diabetes mellitus with stage 2 chronic kidney disease, without long-term current use of insulin (HCC)  -  Microalbumin / creatinine urine ratio - Hemoglobin A1c - Insulin, random - HM DIABETES FOOT EXAM - LOW EXTREMITY NEUR EXAM DOCUM  6. ADD (attention deficit disorder)   7. Screening for rectal cancer  - POC Hemoccult Bld/Stl   8. Prostate cancer screening  - PSA  9. Screening examination for pulmonary tuberculosis  - PPD  10. Medication management  - Urinalysis, Routine w reflex microscopic - CBC with Differential/Platelet - BASIC METABOLIC PANEL WITH GFR - Hepatic function panel - Magnesium  11. Other fatigue  - Vitamin B12 - Iron and TIBC   Continue prudent diet as discussed, weight control, BP monitoring, regular exercise, and medications as discussed.  Discussed med effects and SE's. Routine screening labs and tests as requested with regular follow-up as recommended. Over 40 minutes of exam, counseling, chart review and high complex critical decision making was performed

## 2015-10-28 NOTE — Patient Instructions (Signed)

## 2015-10-29 LAB — URINALYSIS, ROUTINE W REFLEX MICROSCOPIC
Bilirubin Urine: NEGATIVE
HGB URINE DIPSTICK: NEGATIVE
Ketones, ur: NEGATIVE
LEUKOCYTES UA: NEGATIVE
Nitrite: NEGATIVE
PROTEIN: NEGATIVE
Specific Gravity, Urine: 1.023 (ref 1.001–1.035)
pH: 5 (ref 5.0–8.0)

## 2015-10-29 LAB — VITAMIN D 25 HYDROXY (VIT D DEFICIENCY, FRACTURES): Vit D, 25-Hydroxy: 54 ng/mL (ref 30–100)

## 2015-10-29 LAB — URINALYSIS, MICROSCOPIC ONLY
Bacteria, UA: NONE SEEN [HPF]
CASTS: NONE SEEN [LPF]
RBC / HPF: NONE SEEN RBC/HPF (ref <=2)
Squamous Epithelial / LPF: NONE SEEN [HPF] (ref <=5)
WBC, UA: NONE SEEN WBC/HPF (ref <=5)
YEAST: NONE SEEN [HPF]

## 2015-10-29 LAB — MICROALBUMIN / CREATININE URINE RATIO
CREATININE, URINE: 155 mg/dL (ref 20–370)
MICROALB UR: 2 mg/dL
Microalb Creat Ratio: 13 mcg/mg creat (ref <30)

## 2015-10-29 LAB — PSA: PSA: 2.28 ng/mL (ref <=4.00)

## 2015-10-31 LAB — INSULIN, RANDOM: INSULIN: 34.4 u[IU]/mL — AB (ref 2.0–19.6)

## 2016-01-27 ENCOUNTER — Other Ambulatory Visit: Payer: Self-pay | Admitting: Physician Assistant

## 2016-02-01 ENCOUNTER — Other Ambulatory Visit: Payer: Self-pay | Admitting: Internal Medicine

## 2016-02-01 DIAGNOSIS — F988 Other specified behavioral and emotional disorders with onset usually occurring in childhood and adolescence: Secondary | ICD-10-CM

## 2016-02-01 MED ORDER — METHYLPHENIDATE HCL ER (OSM) 36 MG PO TBCR: EXTENDED_RELEASE_TABLET | ORAL | Status: DC

## 2016-02-08 ENCOUNTER — Encounter: Payer: Self-pay | Admitting: Internal Medicine

## 2016-02-08 ENCOUNTER — Ambulatory Visit (INDEPENDENT_AMBULATORY_CARE_PROVIDER_SITE_OTHER): Payer: BLUE CROSS/BLUE SHIELD | Admitting: Internal Medicine

## 2016-02-08 VITALS — BP 110/66 | HR 62 | Temp 98.0°F | Resp 16 | Ht 67.0 in | Wt 240.0 lb

## 2016-02-08 DIAGNOSIS — Z79899 Other long term (current) drug therapy: Secondary | ICD-10-CM | POA: Diagnosis not present

## 2016-02-08 DIAGNOSIS — N182 Chronic kidney disease, stage 2 (mild): Secondary | ICD-10-CM

## 2016-02-08 DIAGNOSIS — I10 Essential (primary) hypertension: Secondary | ICD-10-CM

## 2016-02-08 DIAGNOSIS — E785 Hyperlipidemia, unspecified: Secondary | ICD-10-CM

## 2016-02-08 DIAGNOSIS — F988 Other specified behavioral and emotional disorders with onset usually occurring in childhood and adolescence: Secondary | ICD-10-CM

## 2016-02-08 DIAGNOSIS — M25561 Pain in right knee: Secondary | ICD-10-CM | POA: Diagnosis not present

## 2016-02-08 DIAGNOSIS — E559 Vitamin D deficiency, unspecified: Secondary | ICD-10-CM | POA: Diagnosis not present

## 2016-02-08 DIAGNOSIS — E1122 Type 2 diabetes mellitus with diabetic chronic kidney disease: Secondary | ICD-10-CM | POA: Diagnosis not present

## 2016-02-08 DIAGNOSIS — F909 Attention-deficit hyperactivity disorder, unspecified type: Secondary | ICD-10-CM

## 2016-02-08 LAB — CBC WITH DIFFERENTIAL/PLATELET
BASOS ABS: 0 {cells}/uL (ref 0–200)
BASOS PCT: 0 %
EOS PCT: 3 %
Eosinophils Absolute: 234 cells/uL (ref 15–500)
HCT: 45.8 % (ref 38.5–50.0)
HEMOGLOBIN: 15.6 g/dL (ref 13.2–17.1)
LYMPHS ABS: 1716 {cells}/uL (ref 850–3900)
Lymphocytes Relative: 22 %
MCH: 30.8 pg (ref 27.0–33.0)
MCHC: 34.1 g/dL (ref 32.0–36.0)
MCV: 90.5 fL (ref 80.0–100.0)
MONOS PCT: 10 %
MPV: 9.6 fL (ref 7.5–12.5)
Monocytes Absolute: 780 cells/uL (ref 200–950)
NEUTROS ABS: 5070 {cells}/uL (ref 1500–7800)
Neutrophils Relative %: 65 %
PLATELETS: 298 10*3/uL (ref 140–400)
RBC: 5.06 MIL/uL (ref 4.20–5.80)
RDW: 13.3 % (ref 11.0–15.0)
WBC: 7.8 10*3/uL (ref 3.8–10.8)

## 2016-02-08 LAB — BASIC METABOLIC PANEL WITH GFR
BUN: 14 mg/dL (ref 7–25)
CHLORIDE: 102 mmol/L (ref 98–110)
CO2: 25 mmol/L (ref 20–31)
Calcium: 9.4 mg/dL (ref 8.6–10.3)
Creat: 1.1 mg/dL (ref 0.70–1.25)
GFR, EST NON AFRICAN AMERICAN: 69 mL/min (ref >=60)
GFR, Est African American: 80 mL/min (ref >=60)
Glucose, Bld: 195 mg/dL — ABNORMAL HIGH (ref 65–99)
POTASSIUM: 4.9 mmol/L (ref 3.5–5.3)
SODIUM: 136 mmol/L (ref 135–146)

## 2016-02-08 LAB — HEPATIC FUNCTION PANEL
ALK PHOS: 69 U/L (ref 40–115)
ALT: 17 U/L (ref 9–46)
AST: 13 U/L (ref 10–35)
Albumin: 4.4 g/dL (ref 3.6–5.1)
BILIRUBIN DIRECT: 0.1 mg/dL (ref <=0.2)
BILIRUBIN INDIRECT: 0.3 mg/dL (ref 0.2–1.2)
BILIRUBIN TOTAL: 0.4 mg/dL (ref 0.2–1.2)
Total Protein: 6.6 g/dL (ref 6.1–8.1)

## 2016-02-08 LAB — LIPID PANEL
Cholesterol: 148 mg/dL (ref 125–200)
HDL: 66 mg/dL (ref >=40)
LDL Cholesterol: 67 mg/dL (ref <130)
TRIGLYCERIDES: 77 mg/dL (ref <150)
Total CHOL/HDL Ratio: 2.2 Ratio (ref <=5.0)
VLDL: 15 mg/dL (ref <30)

## 2016-02-08 LAB — TSH: TSH: 1.74 mIU/L (ref 0.40–4.50)

## 2016-02-08 MED ORDER — MELOXICAM 15 MG PO TABS: ORAL_TABLET | ORAL | Status: DC

## 2016-02-08 NOTE — Progress Notes (Signed)
Assessment and Plan:  Hypertension:  -Continue medication -monitor blood pressure at home. -Continue DASH diet -Reminder to go to the ER if any CP, SOB, nausea, dizziness, severe HA, changes vision/speech, left arm numbness and tingling and jaw pain.  Cholesterol - Continue diet and exercise -Check cholesterol.   Diabetes with diabetic chronic kidney disease -Continue diet and exercise.  -Check A1C  Vitamin D Def -check level -continue medications.   ADD -patient is up to date on his concerta -patient was asking for 3 months worth of prescriptions which he was told was not a possibility due to this being a controlled substance.  He has to come pick prescription up every month.  Prescription was just filled on 02/01/16.  Too soon for refill.   Continue diet and meds as discussed. Further disposition pending results of labs. Discussed med's effects and SE's.    HPI 65 y.o. male  presents for 3 month follow up with hypertension, hyperlipidemia, diabetes and vitamin D deficiency.   His blood pressure has been controlled at home, today their BP is BP: 110/66 mmHg.He does workout. He denies chest pain, shortness of breath, dizziness.  He is using a bowflex elliptical machine.  He generally does this for 14 minutes.     He is on cholesterol medication and denies myalgias. His cholesterol is at goal. The cholesterol was:  10/28/2015: Cholesterol 145; HDL 51; LDL Cholesterol 26; Triglycerides 342*   He has not been working on diet and exercise for diabetes with diabetic chronic kidney disease, he is on bASA, he is on ACE/ARB, and denies  foot ulcerations, hyperglycemia, hypoglycemia , increased appetite, nausea, paresthesia of the feet, polydipsia, polyuria, visual disturbances, vomiting and weight loss. Last A1C was: 10/28/2015: Hgb A1c MFr Bld 8.0*.  He reports that his blood sugars are generally around 140-150.     Patient is on Vitamin D supplement. 10/28/2015: Vit D, 25-Hydroxy 54  He  reports that his attention span is doing good.  He reports that he is still working full time.  He reports that he does take breaks from the concerta.    Current Medications:  Current Outpatient Prescriptions on File Prior to Visit  Medication Sig Dispense Refill  . albuterol (PROVENTIL HFA;VENTOLIN HFA) 108 (90 BASE) MCG/ACT inhaler Inhale 2 puffs into the lungs every 6 (six) hours as needed for wheezing or shortness of breath. 1 Inhaler 2  . glipiZIDE (GLUCOTROL) 5 MG tablet Take 1 tablet (5 mg total) by mouth daily before breakfast. 30 tablet 11  . meloxicam (MOBIC) 15 MG tablet Take one daily with food for 2 weeks, can take with tylenol, can not take with aleve, iburpofen, then as needed daily for pain 30 tablet 1  . metFORMIN (GLUCOPHAGE-XR) 500 MG 24 hr tablet Take 4 tablets (2,000 mg total) by mouth daily. 360 tablet 3  . methylphenidate (CONCERTA) 36 MG PO CR tablet Take 1 to 2 tablets daily if needed for alertness 60 tablet 0  . olmesartan (BENICAR) 40 MG tablet Take 1 tablet (40 mg total) by mouth daily. 30 tablet 3  . rosuvastatin (CRESTOR) 20 MG tablet TAKE ONE TABLET BY MOUTH ONCE DAILY FOR CHOLESTEROL 30 tablet 6  . sildenafil (VIAGRA) 100 MG tablet Take 1 tablet (100 mg total) by mouth as needed for erectile dysfunction. 10 tablet 1  . Vitamin D, Ergocalciferol, (DRISDOL) 50000 units CAPS capsule TAKE 1 CAPSULE BY MOUTH 3 TIMES PER WEEK. 36 capsule 1   No current facility-administered medications on file  prior to visit.   Medical History:  Past Medical History  Diagnosis Date  . Hyperlipidemia   . Hypertension   . Anxiety   . Depression   . Diabetes mellitus without complication ([place])   . ADD (attention deficit disorder)   . Unspecified vitamin D deficiency    Allergies:  Allergies  Allergen Reactions  . Adderall [Amphetamine-Dextroamphet Er]     MOOD CHANGE  . Lipitor [Atorvastatin]     MYALGIA  . Wellbutrin [Bupropion]     HA  . Zoloft [Sertraline Hcl]      AGGITATION     Review of Systems:  Review of Systems  Constitutional: Negative for fever, chills and malaise/fatigue.  HENT: Negative for congestion, ear pain and sore throat.   Eyes: Negative.   Respiratory: Negative for cough, shortness of breath and wheezing.   Cardiovascular: Negative for chest pain, palpitations and leg swelling.  Gastrointestinal: Negative for heartburn, abdominal pain, diarrhea, constipation, blood in stool and melena.  Genitourinary: Negative.   Skin: Negative.   Neurological: Negative for dizziness, sensory change, loss of consciousness and headaches.  Psychiatric/Behavioral: Negative for depression. The patient is not nervous/anxious and does not have insomnia.     Family history- Review and unchanged  Social history- Review and unchanged  Physical Exam: BP 110/66 mmHg  Pulse 62  Temp(Src) 98 F (36.7 C) (Temporal)  Resp 16  Ht 5\' 7"  (1.702 m)  Wt 240 lb (108.863 kg)  BMI 37.58 kg/m2 Wt Readings from Last 3 Encounters:  02/08/16 240 lb (108.863 kg)  10/28/15 234 lb (106.142 kg)  09/09/15 231 lb (104.781 kg)   General Appearance: Well nourished well developed, non-toxic appearing, in no apparent distress. Eyes: PERRLA, EOMs, conjunctiva no swelling or erythema ENT/Mouth: Ear canals clear with no erythema, swelling, or discharge.  TMs normal bilaterally, oropharynx clear, moist, with no exudate.   Neck: Supple, thyroid normal, no JVD, no cervical adenopathy.  Respiratory: Respiratory effort normal, breath sounds clear A&P, no wheeze, rhonchi or rales noted.  No retractions, no accessory muscle usage Cardio: RRR with no MRGs. No noted edema.  Abdomen: Soft, + BS.  Non tender, no guarding, rebound, hernias, masses. Musculoskeletal: Full ROM, 5/5 strength, Ambulates with walker gait Skin: Warm, dry without rashes, lesions, ecchymosis.  Neuro: Awake and oriented X 3, Cranial nerves intact. No cerebellar symptoms.  Psych: normal affect, Insight and  Judgment appropriate.    [name], PA-C 9:00 AM [facility]

## 2016-02-09 LAB — HEMOGLOBIN A1C
Hgb A1c MFr Bld: 9.8 % — ABNORMAL HIGH (ref <5.7)
Mean Plasma Glucose: 235 mg/dL

## 2016-03-20 ENCOUNTER — Other Ambulatory Visit: Payer: Self-pay

## 2016-03-20 DIAGNOSIS — F988 Other specified behavioral and emotional disorders with onset usually occurring in childhood and adolescence: Secondary | ICD-10-CM

## 2016-03-20 MED ORDER — METHYLPHENIDATE HCL ER (OSM) 36 MG PO TBCR: EXTENDED_RELEASE_TABLET | ORAL | 0 refills | Status: DC

## 2016-05-15 ENCOUNTER — Ambulatory Visit (INDEPENDENT_AMBULATORY_CARE_PROVIDER_SITE_OTHER): Payer: BLUE CROSS/BLUE SHIELD | Admitting: Internal Medicine

## 2016-05-15 ENCOUNTER — Encounter: Payer: Self-pay | Admitting: Internal Medicine

## 2016-05-15 VITALS — BP 130/82 | HR 90 | Temp 98.0°F | Resp 16 | Ht 67.0 in | Wt 243.0 lb

## 2016-05-15 DIAGNOSIS — E559 Vitamin D deficiency, unspecified: Secondary | ICD-10-CM | POA: Diagnosis not present

## 2016-05-15 DIAGNOSIS — N182 Chronic kidney disease, stage 2 (mild): Secondary | ICD-10-CM

## 2016-05-15 DIAGNOSIS — F900 Attention-deficit hyperactivity disorder, predominantly inattentive type: Secondary | ICD-10-CM | POA: Diagnosis not present

## 2016-05-15 DIAGNOSIS — E1122 Type 2 diabetes mellitus with diabetic chronic kidney disease: Secondary | ICD-10-CM | POA: Diagnosis not present

## 2016-05-15 DIAGNOSIS — Z79899 Other long term (current) drug therapy: Secondary | ICD-10-CM | POA: Diagnosis not present

## 2016-05-15 DIAGNOSIS — E782 Mixed hyperlipidemia: Secondary | ICD-10-CM | POA: Diagnosis not present

## 2016-05-15 DIAGNOSIS — I10 Essential (primary) hypertension: Secondary | ICD-10-CM

## 2016-05-15 LAB — CBC WITH DIFFERENTIAL/PLATELET
BASOS ABS: 0 {cells}/uL (ref 0–200)
Basophils Relative: 0 %
EOS ABS: 166 {cells}/uL (ref 15–500)
Eosinophils Relative: 2 %
HEMATOCRIT: 45.6 % (ref 38.5–50.0)
HEMOGLOBIN: 15.5 g/dL (ref 13.2–17.1)
LYMPHS ABS: 2490 {cells}/uL (ref 850–3900)
LYMPHS PCT: 30 %
MCH: 31 pg (ref 27.0–33.0)
MCHC: 34 g/dL (ref 32.0–36.0)
MCV: 91.2 fL (ref 80.0–100.0)
MONO ABS: 830 {cells}/uL (ref 200–950)
MPV: 9.2 fL (ref 7.5–12.5)
Monocytes Relative: 10 %
NEUTROS PCT: 58 %
Neutro Abs: 4814 cells/uL (ref 1500–7800)
Platelets: 337 10*3/uL (ref 140–400)
RBC: 5 MIL/uL (ref 4.20–5.80)
RDW: 13 % (ref 11.0–15.0)
WBC: 8.3 10*3/uL (ref 3.8–10.8)

## 2016-05-15 LAB — HEMOGLOBIN A1C
Hgb A1c MFr Bld: 9.5 % — ABNORMAL HIGH (ref <5.7)
Mean Plasma Glucose: 226 mg/dL

## 2016-05-15 LAB — TSH: TSH: 3.89 m[IU]/L (ref 0.40–4.50)

## 2016-05-15 MED ORDER — METHYLPHENIDATE HCL ER (OSM) 36 MG PO TBCR: EXTENDED_RELEASE_TABLET | ORAL | 0 refills | Status: DC

## 2016-05-15 MED ORDER — VITAMIN D (ERGOCALCIFEROL) 1.25 MG (50000 UNIT) PO CAPS: ORAL_CAPSULE | ORAL | 1 refills | Status: DC

## 2016-05-15 NOTE — Progress Notes (Signed)
[facility] Joseph Campbell, M.D.        Joseph Campbell. Joseph Campbell, P.A.-C       Joseph Campbell, P.A.-C  [facility]                [address] [place], [place] [id number] Telephone [phone] Telefax [phone] ______________________________________________________________________     This very nice 65 y.o. [name] presents for 6 month follow up with Hypertension, Hyperlipidemia, Pre-Diabetes and Vitamin D Deficiency. Patient also has ADD- inattentive type and has been on Concerta with improved ability to maintain alertness, focus and concentrate on the job.      Patient is treated for HTN (1990's)  & BP has been controlled at home. Today's BP is  130/82. Patient has had no complaints of any cardiac type chest pain, palpitations, dyspnea/orthopnea/PND, dizziness, claudication, or dependent edema.     Hyperlipidemia is controlled with diet & meds. Patient denies myalgias or other med SE's. Last Lipids were  Lab Results  Component Value Date   CHOL 148 02/08/2016   HDL 66 02/08/2016   LDLCALC 67 02/08/2016   TRIG 77 02/08/2016   CHOLHDL 2.2 02/08/2016      Also, the patient has history of Morbid Obesity (BMI 38+) and consequent T2_DM/CKD2  And has poor dietary compliance as evidenced by his A1c's.  He denies symptoms of reactive hypoglycemia, diabetic polys, paresthesias or visual blurring.  Last A1c was not at goal:  Lab Results  Component Value Date   HGBA1C 9.8 (H) 02/08/2016      Further, the patient also has history of Vitamin D Deficiency of "26"  and supplements vitamin D without any suspected side-effects. Last vitamin D was still sl low (Goal is betw 70-100): Lab Results  Component Value Date   VD25OH 54 10/28/2015   Current Outpatient Prescriptions on File Prior to Visit  Medication Sig  . albuterol (PROVENTIL HFA;VENTOLIN HFA) 108 (90 BASE) MCG/ACT inhaler Inhale 2 puffs into the lungs every 6  (six) hours as needed for wheezing or shortness of breath.  Marland Kitchen glipiZIDE (GLUCOTROL) 5 MG tablet Take 1 tablet (5 mg total) by mouth daily before breakfast.  . meloxicam (MOBIC) 15 MG tablet Take one daily with food for 2 weeks, can take with tylenol, can not take with aleve, iburpofen, then as needed daily for pain  . metFORMIN (GLUCOPHAGE-XR) 500 MG 24 hr tablet Take 4 tablets (2,000 mg total) by mouth daily.  . methylphenidate (CONCERTA) 36 MG PO CR tablet Take 1 to 2 tablets daily if needed for alertness. Take max of 1 & 1/2 tablet a day. Should last 8wks.  Marland Kitchen olmesartan (BENICAR) 40 MG tablet Take 1 tablet (40 mg total) by mouth daily.  . rosuvastatin (CRESTOR) 20 MG tablet TAKE ONE TABLET BY MOUTH ONCE DAILY FOR CHOLESTEROL  . sildenafil (VIAGRA) 100 MG tablet Take 1 tablet (100 mg total) by mouth as needed for erectile dysfunction.   No current facility-administered medications on file prior to visit.    Allergies  Allergen Reactions  . Adderall [Amphetamine-Dextroamphet Er]     MOOD CHANGE  . Lipitor [Atorvastatin]     MYALGIA  . Wellbutrin [Bupropion]     HA  . Zoloft [Sertraline Hcl]     AGGITATION   PMHx:   Past Medical History:  Diagnosis Date  . ADD (attention deficit disorder)   . Anxiety   .  Depression   . Diabetes mellitus without complication ([place])   . Hyperlipidemia   . Hypertension   . Unspecified vitamin D deficiency    Immunization History  Administered Date(s) Administered  . DT 09/16/2014  . PPD Test 09/16/2014, 10/28/2015  . Pneumococcal-Unspecified 07/31/1999  . Td 07/31/2003  . Zoster 05/26/2013   No past surgical history on file. FHx:    Reviewed / unchanged  SHx:    Reviewed / unchanged  Systems Review:  Constitutional: Denies fever, chills, wt changes, headaches, insomnia, fatigue, night sweats, change in appetite. Eyes: Denies redness, blurred vision, diplopia, discharge, itchy, watery eyes.  ENT: Denies discharge, congestion, post nasal  drip, epistaxis, sore throat, earache, hearing loss, dental pain, tinnitus, vertigo, sinus pain, snoring.  CV: Denies chest pain, palpitations, irregular heartbeat, syncope, dyspnea, diaphoresis, orthopnea, PND, claudication or edema. Respiratory: denies cough, dyspnea, DOE, pleurisy, hoarseness, laryngitis, wheezing.  Gastrointestinal: Denies dysphagia, odynophagia, heartburn, reflux, water brash, abdominal pain or cramps, nausea, vomiting, bloating, diarrhea, constipation, hematemesis, melena, hematochezia  or hemorrhoids. Genitourinary: Denies dysuria, frequency, urgency, nocturia, hesitancy, discharge, hematuria or flank pain. Musculoskeletal: Denies arthralgias, myalgias, stiffness, jt. swelling, pain, limping or strain/sprain.  Skin: Denies pruritus, rash, hives, warts, acne, eczema or change in skin lesion(s). Neuro: No weakness, tremor, incoordination, spasms, paresthesia or pain. Psychiatric: Denies confusion, memory loss or sensory loss. Endo: Denies change in weight, skin or hair change.  Heme/Lymph: No excessive bleeding, bruising or enlarged lymph nodes.  Physical Exam BP 130/82   Pulse 90   Temp 98 F (36.7 C) (Temporal)   Resp 16   Ht 5\' 7"  (1.702 m)   Wt 243 lb (110.2 kg)   BMI 38.06 kg/m   Appears over nourished and in no distress.  Eyes: PERRLA, EOMs, conjunctiva no swelling or erythema. Sinuses: No frontal/maxillary tenderness ENT/Mouth: EAC's clear, TM's nl w/o erythema, bulging. Nares clear w/o erythema, swelling, exudates. Oropharynx clear without erythema or exudates. Oral hygiene is good. Tongue normal, non obstructing. Hearing intact.  Neck: Supple. Thyroid nl. Car 2+/2+ without bruits, nodes or JVD. Chest: Respirations nl with BS clear & equal w/o rales, rhonchi, wheezing or stridor.  Cor: Heart sounds normal w/ regular rate and rhythm without sig. murmurs, gallops, clicks, or rubs. Peripheral pulses normal and equal  without edema.  Abdomen: Soft, rotund &  bowel sounds normal. Non-tender w/o guarding, rebound, hernias, masses, or organomegaly.  Lymphatics: Unremarkable.  Musculoskeletal: Full ROM all peripheral extremities, joint stability, 5/5 strength, and normal gait.  Skin: Warm, dry without exposed rashes, lesions or ecchymosis apparent.  Neuro: Cranial nerves intact, reflexes equal bilaterally. Sensory-motor testing grossly intact. Tendon reflexes grossly intact.  Pysch: Alert & oriented x 3.  Insight and judgement nl & appropriate. No ideations.  Assessment and Plan:  1. Essential hypertension  - Continue medication, monitor blood pressure at home. Continue DASH diet. Reminder to go to the ER if any CP, SOB, nausea, dizziness, severe HA, changes vision/speech, left arm numbness and tingling and jaw pain. - TSH  2. Mixed hyperlipidemia  - Continue diet/meds, exercise,& lifestyle modifications. Continue monitor periodic cholesterol/liver & renal functions  - Lipid panel - TSH  3. Type 2 diabetes mellitus with stage 2 chronic kidney disease, without long-term current use of insulin ([place])  - long discussion re: importance of better diet for weight loss  - Continue diet, exercise, lifestyle modifications. Monitor appropriate labs. - Hemoglobin A1c - Insulin, random  4. Vitamin D deficiency  - Continue supplementation. - VITAMIN D 25  Hydroxy  5. Attention deficit hyperactivity disorder (ADHD), predominantly inattentive type  - long discussion about judicious use to limit to max 5 days/week as it is a controlled substance - methylphenidate (CONCERTA) 36 MG PO CR tablet; Take 1 to 2 tablets daily if needed for alertness. Take max of 10 tabs/week Should last 12 wks.(Jan 16th)  Dispense: 120 tablet; Refill: 0  6. Medication management  - CBC with Differential/Platelet - BASIC METABOLIC PANEL WITH GFR - Hepatic function panel - Magnesium       Recommended regular exercise, BP monitoring, weight control, and discussed med and  SE's. Recommended labs to assess and monitor clinical status. Further disposition pending results of labs. Over 30 minutes of exam, counseling, chart review was performed

## 2016-05-15 NOTE — Patient Instructions (Signed)

## 2016-05-16 LAB — HEPATIC FUNCTION PANEL
ALBUMIN: 4.2 g/dL (ref 3.6–5.1)
ALT: 16 U/L (ref 9–46)
AST: 14 U/L (ref 10–35)
Alkaline Phosphatase: 73 U/L (ref 40–115)
BILIRUBIN DIRECT: 0.1 mg/dL (ref <=0.2)
BILIRUBIN TOTAL: 0.5 mg/dL (ref 0.2–1.2)
Indirect Bilirubin: 0.4 mg/dL (ref 0.2–1.2)
Total Protein: 7 g/dL (ref 6.1–8.1)

## 2016-05-16 LAB — LIPID PANEL
CHOL/HDL RATIO: 2.6 ratio (ref <=5.0)
CHOLESTEROL: 144 mg/dL (ref 125–200)
HDL: 56 mg/dL (ref >=40)
LDL Cholesterol: 58 mg/dL (ref <130)
Triglycerides: 149 mg/dL (ref <150)
VLDL: 30 mg/dL (ref <30)

## 2016-05-16 LAB — BASIC METABOLIC PANEL WITH GFR
BUN: 14 mg/dL (ref 7–25)
CALCIUM: 9.5 mg/dL (ref 8.6–10.3)
CO2: 20 mmol/L (ref 20–31)
CREATININE: 1.21 mg/dL (ref 0.70–1.25)
Chloride: 100 mmol/L (ref 98–110)
GFR, EST AFRICAN AMERICAN: 71 mL/min (ref >=60)
GFR, Est Non African American: 62 mL/min (ref >=60)
Glucose, Bld: 231 mg/dL — ABNORMAL HIGH (ref 65–99)
Potassium: 4.7 mmol/L (ref 3.5–5.3)
SODIUM: 134 mmol/L — AB (ref 135–146)

## 2016-05-16 LAB — VITAMIN D 25 HYDROXY (VIT D DEFICIENCY, FRACTURES): VIT D 25 HYDROXY: 58 ng/mL (ref 30–100)

## 2016-05-16 LAB — INSULIN, RANDOM: INSULIN: 9.6 u[IU]/mL (ref 2.0–19.6)

## 2016-05-16 LAB — MAGNESIUM: MAGNESIUM: 2 mg/dL (ref 1.5–2.5)

## 2016-08-05 ENCOUNTER — Other Ambulatory Visit: Payer: Self-pay | Admitting: Physician Assistant

## 2016-08-22 ENCOUNTER — Ambulatory Visit (INDEPENDENT_AMBULATORY_CARE_PROVIDER_SITE_OTHER): Payer: BLUE CROSS/BLUE SHIELD | Admitting: Physician Assistant

## 2016-08-22 ENCOUNTER — Encounter: Payer: Self-pay | Admitting: Physician Assistant

## 2016-08-22 VITALS — BP 120/80 | HR 97 | Temp 97.7°F | Resp 16 | Ht 67.0 in | Wt 236.0 lb

## 2016-08-22 DIAGNOSIS — E782 Mixed hyperlipidemia: Secondary | ICD-10-CM | POA: Diagnosis not present

## 2016-08-22 DIAGNOSIS — F900 Attention-deficit hyperactivity disorder, predominantly inattentive type: Secondary | ICD-10-CM | POA: Diagnosis not present

## 2016-08-22 DIAGNOSIS — Z79899 Other long term (current) drug therapy: Secondary | ICD-10-CM

## 2016-08-22 DIAGNOSIS — N182 Chronic kidney disease, stage 2 (mild): Secondary | ICD-10-CM | POA: Diagnosis not present

## 2016-08-22 DIAGNOSIS — I10 Essential (primary) hypertension: Secondary | ICD-10-CM

## 2016-08-22 DIAGNOSIS — E1122 Type 2 diabetes mellitus with diabetic chronic kidney disease: Secondary | ICD-10-CM | POA: Diagnosis not present

## 2016-08-22 DIAGNOSIS — E559 Vitamin D deficiency, unspecified: Secondary | ICD-10-CM | POA: Diagnosis not present

## 2016-08-22 LAB — BASIC METABOLIC PANEL WITH GFR
BUN: 21 mg/dL (ref 7–25)
CO2: 27 mmol/L (ref 20–31)
Calcium: 10.4 mg/dL — ABNORMAL HIGH (ref 8.6–10.3)
Chloride: 97 mmol/L — ABNORMAL LOW (ref 98–110)
Creat: 1.08 mg/dL (ref 0.70–1.25)
GFR, EST NON AFRICAN AMERICAN: 71 mL/min (ref >=60)
GFR, Est African American: 82 mL/min (ref >=60)
Glucose, Bld: 320 mg/dL — ABNORMAL HIGH (ref 65–99)
Potassium: 5.2 mmol/L (ref 3.5–5.3)
SODIUM: 133 mmol/L — AB (ref 135–146)

## 2016-08-22 LAB — CBC WITH DIFFERENTIAL/PLATELET
Basophils Absolute: 0 cells/uL (ref 0–200)
Basophils Relative: 0 %
Eosinophils Absolute: 162 cells/uL (ref 15–500)
Eosinophils Relative: 2 %
HCT: 46.9 % (ref 38.5–50.0)
Hemoglobin: 16 g/dL (ref 13.2–17.1)
LYMPHS PCT: 27 %
Lymphs Abs: 2187 cells/uL (ref 850–3900)
MCH: 30.9 pg (ref 27.0–33.0)
MCHC: 34.1 g/dL (ref 32.0–36.0)
MCV: 90.7 fL (ref 80.0–100.0)
MONOS PCT: 11 %
MPV: 9.9 fL (ref 7.5–12.5)
Monocytes Absolute: 891 cells/uL (ref 200–950)
Neutro Abs: 4860 cells/uL (ref 1500–7800)
Neutrophils Relative %: 60 %
PLATELETS: 337 10*3/uL (ref 140–400)
RBC: 5.17 MIL/uL (ref 4.20–5.80)
RDW: 12.9 % (ref 11.0–15.0)
WBC: 8.1 10*3/uL (ref 3.8–10.8)

## 2016-08-22 LAB — MAGNESIUM: MAGNESIUM: 2.3 mg/dL (ref 1.5–2.5)

## 2016-08-22 LAB — HEPATIC FUNCTION PANEL
ALK PHOS: 69 U/L (ref 40–115)
ALT: 15 U/L (ref 9–46)
AST: 12 U/L (ref 10–35)
Albumin: 4.4 g/dL (ref 3.6–5.1)
BILIRUBIN INDIRECT: 0.3 mg/dL (ref 0.2–1.2)
BILIRUBIN TOTAL: 0.4 mg/dL (ref 0.2–1.2)
Bilirubin, Direct: 0.1 mg/dL (ref <=0.2)
Total Protein: 7.3 g/dL (ref 6.1–8.1)

## 2016-08-22 LAB — LIPID PANEL
Cholesterol: 196 mg/dL (ref <200)
HDL: 61 mg/dL (ref >40)
LDL CALC: 97 mg/dL (ref <100)
Total CHOL/HDL Ratio: 3.2 Ratio (ref <5.0)
Triglycerides: 191 mg/dL — ABNORMAL HIGH (ref <150)
VLDL: 38 mg/dL — AB (ref <30)

## 2016-08-22 LAB — TSH: TSH: 2.36 mIU/L (ref 0.40–4.50)

## 2016-08-22 MED ORDER — METHYLPHENIDATE HCL ER (OSM) 36 MG PO TBCR: EXTENDED_RELEASE_TABLET | ORAL | 0 refills | Status: DC

## 2016-08-22 NOTE — Patient Instructions (Signed)
Please be careful with glipizide (Glucotrol). This medication forces your blood sugar down no matter what it is starting at. Only take 1/2 of the medication if your sugar is above 150 and you can take a whole pill if your sugar is above 180 in the morning. If at any time you start to have low blood sugars in the morning or during the day please stop this medication. Please never take this medication if you are sick or can not eat. A low blood sugar is much more dangerous than a high blood sugar. Your brain needs two things, sugar and oxygen.   Diabetes is a very complicated disease...lets simplify it.  An easy way to look at it to understand the complications is if you think of the extra sugar floating in your blood stream as glass shards floating through your blood stream.    Diabetes affects your small vessels first: 1) The glass shards (sugar) scraps down the tiny blood vessels in your eyes and lead to diabetic retinopathy, the leading cause of blindness in the [place]. Diabetes is the leading cause of newly diagnosed adult (42 to 68 years of age) blindness in the [place].  2) The glass shards scratches down the tiny vessels of your legs leading to nerve damage called neuropathy and can lead to amputations of your feet. More than 60% of all non-traumatic amputations of lower limbs occur in people with diabetes.  3) Over time the small vessels in your brain are shredded and closed off, individually this does not cause any problems but over a long period of time many of the small vessels being blocked can lead to Vascular Dementia.   4) Your kidney's are a filter system and have a "net" that keeps certain things in the body and lets bad things out. Sugar shreds this net and leads to kidney damage and eventually failure. Decreasing the sugar that is destroying the net and certain blood pressure medications can help stop or decrease progression of kidney disease. Diabetes was the primary cause of kidney  failure in 44 percent of all new cases in 2011.  5) Diabetes also destroys the small vessels in your penis that lead to erectile dysfunction. Eventually the vessels are so damaged that you may not be responsive to cialis or viagra.   Diabetes and your large vessels: Your larger vessels consist of your coronary arteries in your heart and the carotid vessels to your brain. Diabetes or even increased sugars put you at 300% increased risk of heart attack and stroke and this is why.. The sugar scrapes down your large blood vessels and your body sees this as an internal injury and tries to repair itself. Just like you get a scab on your skin, your platelets will stick to the blood vessel wall trying to heal it. This is why we have diabetics on low dose aspirin daily, this prevents the platelets from sticking and can prevent plaque formation. In addition, your body takes cholesterol and tries to shove it into the open wound. This is why we want your LDL, or bad cholesterol, below 70.   The combination of platelets and cholesterol over 5-10 years forms plaque that can break off and cause a heart attack or stroke.   PLEASE REMEMBER:  Diabetes is preventable! Up to 55 percent of complications and morbidities among individuals with type 2 diabetes can be prevented, delayed, or effectively treated and minimized with regular visits to a health professional, appropriate monitoring and medication, and  a healthy diet and lifestyle.     Bad carbs also include fruit juice, alcohol, and sweet tea. These are empty calories that do not signal to your brain that you are full.   Please remember the good carbs are still carbs which convert into sugar. So please measure them out no more than 1/2-1 cup of rice, oatmeal, pasta, and beans  Veggies are however free foods! Pile them on.   Not all fruit is created equal. Please see the list below, the fruit at the bottom is higher in sugars than the fruit at the top. Please  avoid all dried fruits.

## 2016-08-22 NOTE — Progress Notes (Signed)
Assessment and Plan:   Hypertension -Continue medication, monitor blood pressure at home. Continue DASH diet.  Reminder to go to the ER if any CP, SOB, nausea, dizziness, severe HA, changes vision/speech, left arm numbness and tingling and jaw pain.  Cholesterol -Continue diet and exercise. Check cholesterol.   Diabetes with diabetic chronic kidney disease -Continue diet and exercise. Check A1C - may need to do glipizide TID or may need to switch to insulin - will add protein shake/slim fast for meal replacement - poor insight to disease  Vitamin D Def - check level and continue medications.   Morbid Obesity with co morbidities - long discussion about weight loss, diet, and exercise  ADD Will fill for 3 months but very long conversation with the patient that due to new guidelines we may not be able to do this in the future and that the medication is better if he does NOT take daily.   Continue diet and meds as discussed. Further disposition pending results of labs. Discussed med's effects and SE's.   Over 30 minutes of exam, counseling, chart review, and critical decision making was performed   HPI 66 y.o. male  presents for 3 month follow up on hypertension, cholesterol, diabetes and vitamin D deficiency.   His blood pressure has been controlled at home, today his BP is BP: 120/80.  He does not workout. He has been working 6-7 days a week, 9 hours a day due to the weather. He denies chest pain, shortness of breath, dizziness.  He is on cholesterol medication, crestor 20mg , and denies myalgias. His cholesterol is at goal. The cholesterol was:   Lab Results  Component Value Date   CHOL 144 05/15/2016   HDL 56 05/15/2016   LDLCALC 58 05/15/2016   TRIG 149 05/15/2016   CHOLHDL 2.6 05/15/2016    He has been working on diet and exercise for diabetes with diabetic chronic kidney disease, he is on bASA, he is on ACE/ARB, he has not checked them lately, also admits to eating poorly,  poor insight to disease, when discussed risk of MI, stroke, renal failure, etc patient states "you have to die some way", most of the time he is on glipizide 1-2 x a day, forgets twice a day and denies  paresthesia of the feet, polydipsia, polyuria and visual disturbances. Last A1C was:  Lab Results  Component Value Date   HGBA1C 9.5 (H) 05/15/2016    Patient is on Vitamin D supplement. Lab Results  Component Value Date   VD25OH 58 05/15/2016   BMI is Body mass index is 36.96 kg/m., he is working on diet and exercise. Wt Readings from Last 3 Encounters:  08/22/16 236 lb (107 kg)  05/15/16 243 lb (110.2 kg)  02/08/16 240 lb (108.9 kg)   Patient is on an ADD medication, he states that the medication is helping and he denies any adverse reactions, since he works 6-7 days a work, most days he takes it.    Current Medications:  Current Outpatient Prescriptions on File Prior to Visit  Medication Sig Dispense Refill  . glipiZIDE (GLUCOTROL) 5 MG tablet TAKE ONE TABLET BY MOUTH ONCE DAILY BEFORE BREAKFAST 90 tablet 1  . meloxicam (MOBIC) 15 MG tablet Take one daily with food for 2 weeks, can take with tylenol, can not take with aleve, iburpofen, then as needed daily for pain 30 tablet 1  . metFORMIN (GLUCOPHAGE-XR) 500 MG 24 hr tablet Take 4 tablets (2,000 mg total) by mouth daily. [place]  tablet 3  . olmesartan (BENICAR) 40 MG tablet Take 1 tablet (40 mg total) by mouth daily. 30 tablet 3  . rosuvastatin (CRESTOR) 20 MG tablet TAKE ONE TABLET BY MOUTH ONCE DAILY FOR CHOLESTEROL 30 tablet 6  . sildenafil (VIAGRA) 100 MG tablet Take 1 tablet (100 mg total) by mouth as needed for erectile dysfunction. 10 tablet 1  . Vitamin D, Ergocalciferol, (DRISDOL) 50000 units CAPS capsule TAKE 1 CAPSULE BY MOUTH 3 TIMES PER WEEK. 36 capsule 1  . methylphenidate (CONCERTA) 36 MG PO CR tablet Take 1 to 2 tablets daily if needed for alertness. Take max of 10 tabs/week Should last 12 wks.(Jan 16th) 120 tablet 0    No current facility-administered medications on file prior to visit.    Medical History:  Past Medical History:  Diagnosis Date  . ADD (attention deficit disorder)   . Anxiety   . Depression   . Diabetes mellitus without complication ([place])   . Hyperlipidemia   . Hypertension   . Unspecified vitamin D deficiency    Allergies:  Allergies  Allergen Reactions  . Adderall [Amphetamine-Dextroamphet Er]     MOOD CHANGE  . Lipitor [Atorvastatin]     MYALGIA  . Wellbutrin [Bupropion]     HA  . Zoloft [Sertraline Hcl]     AGGITATION     Review of Systems:  Review of Systems  Constitutional: Negative.   HENT: Negative.   Eyes: Negative.   Respiratory: Negative.   Cardiovascular: Negative.   Gastrointestinal: Negative.   Genitourinary: Negative.   Musculoskeletal: Positive for joint pain ( right knee pain). Negative for back pain, falls, myalgias and neck pain.  Skin: Negative.   Neurological: Negative.   Endo/Heme/Allergies: Negative.   Psychiatric/Behavioral: Negative.     Family history- Review and unchanged Social history- Review and unchanged Physical Exam: BP 120/80   Pulse 97   Temp 97.7 F (36.5 C)   Resp 16   Ht 5\' 7"  (1.702 m)   Wt 236 lb (107 kg)   SpO2 97%   BMI 36.96 kg/m  Wt Readings from Last 3 Encounters:  08/22/16 236 lb (107 kg)  05/15/16 243 lb (110.2 kg)  02/08/16 240 lb (108.9 kg)   General Appearance: Well nourished, in no apparent distress. Eyes: PERRLA, EOMs, conjunctiva no swelling or erythema Sinuses: No Frontal/maxillary tenderness ENT/Mouth: Ext aud canals clear, TMs without erythema, bulging. No erythema, swelling, or exudate on post pharynx.  Tonsils not swollen or erythematous. Hearing normal.  Neck: Supple, thyroid normal.  Respiratory: Respiratory effort normal, BS equal bilaterally without rales, rhonchi, wheezing or stridor.  Cardio: RRR with no MRGs. Brisk peripheral pulses without edema.  Abdomen: Soft, + BS, obese, Non  tender, no guarding, rebound, hernias, masses. Lymphatics: Non tender without lymphadenopathy.  Musculoskeletal: Full ROM, 5/5 strength, Normal gait Skin: Warm, dry without rashes, lesions, ecchymosis.  Neuro: Cranial nerves intact. No cerebellar symptoms.  Psych: Awake and oriented X 3, normal affect, Insight and Judgment appropriate.    Vicie Mutters, PA-C 8:35 AM [facility]

## 2016-08-23 LAB — HEMOGLOBIN A1C
Hgb A1c MFr Bld: 11.4 % — ABNORMAL HIGH (ref <5.7)
Mean Plasma Glucose: 280 mg/dL

## 2016-08-23 LAB — VITAMIN D 25 HYDROXY (VIT D DEFICIENCY, FRACTURES): VIT D 25 HYDROXY: 68 ng/mL (ref 30–100)

## 2016-08-23 NOTE — Progress Notes (Signed)
Sam-APPT NEEDED FOR: follow up 1 month DM visit    LVM for pt to return office call for LAB results. Message sent to front office for follow up 1 month DM visit

## 2016-08-23 NOTE — Progress Notes (Signed)
Pt aware of lab results & voiced understanding of those results.

## 2016-10-29 ENCOUNTER — Encounter: Payer: Self-pay | Admitting: Physician Assistant

## 2016-10-29 ENCOUNTER — Ambulatory Visit (INDEPENDENT_AMBULATORY_CARE_PROVIDER_SITE_OTHER): Payer: BLUE CROSS/BLUE SHIELD | Admitting: Physician Assistant

## 2016-10-29 VITALS — BP 126/80 | HR 106 | Temp 97.3°F | Resp 16 | Ht 67.0 in | Wt 232.2 lb

## 2016-10-29 DIAGNOSIS — E1122 Type 2 diabetes mellitus with diabetic chronic kidney disease: Secondary | ICD-10-CM | POA: Diagnosis not present

## 2016-10-29 DIAGNOSIS — F900 Attention-deficit hyperactivity disorder, predominantly inattentive type: Secondary | ICD-10-CM | POA: Diagnosis not present

## 2016-10-29 DIAGNOSIS — N182 Chronic kidney disease, stage 2 (mild): Secondary | ICD-10-CM

## 2016-10-29 MED ORDER — GLIPIZIDE 5 MG PO TABS: ORAL_TABLET | ORAL | 1 refills | Status: DC

## 2016-10-29 MED ORDER — METHYLPHENIDATE HCL ER (OSM) 36 MG PO TBCR: EXTENDED_RELEASE_TABLET | ORAL | 0 refills | Status: DC

## 2016-10-29 NOTE — Progress Notes (Signed)
Assessment and Plan:  Type 2 diabetes mellitus with stage 2 chronic kidney disease, without long-term current use of insulin (HCC) Continue glipizide 3 x a day and continue weight loss Has CPE, goal is to not get on insulin Discussed with patient risk of MI/stroke with sugars so high, still poor insight -     glipiZIDE (GLUCOTROL) 5 MG tablet; TAKE ONE TABLET BY MOUTH THREE TIMES A DAY WITH FOOD  Morbid obesity, unspecified obesity type ([place]) - long discussion about weight loss, diet, and exercise  Attention deficit hyperactivity disorder (ADHD), predominantly inattentive type -     methylphenidate (CONCERTA) 36 MG PO CR tablet; Take 1 to 2 tablets daily if needed for alertness. Try to not take daily, more effective Should last 12 wks     Future Appointments Date Time Provider [place]  11/15/2016 9:00 AM Unk Pinto, MD [facility] None    HPI 66 y.o.male presents for DM teaching.  He has DM with CKD, he is on ACE and bASA, he is on glipizide 1-2 a day and last A1C was at 11.4. He has poor insight to his DM, he is here today for DM teaching and to discuss insulin. He states he has been checking his sugars, lowest is 99-125 and the highest 200-250's after food. He has been out of the glipizide for 1 month. He has been doing slim fast for lunch.   Lab Results  Component Value Date   HGBA1C 11.4 (H) 08/22/2016   BMI is Body mass index is 36.37 kg/m., he is working on diet and exercise. Wt Readings from Last 3 Encounters:  10/29/16 232 lb 3.2 oz (105.3 kg)  08/22/16 236 lb (107 kg)  05/15/16 243 lb (110.2 kg)   Blood pressure 126/80, pulse (!) 106, temperature 97.3 F (36.3 C), resp. rate 16, height 5\' 7"  (1.702 m), weight 232 lb 3.2 oz (105.3 kg), SpO2 95 %.   Past Medical History:  Diagnosis Date  . ADD (attention deficit disorder)   . Anxiety   . Depression   . Diabetes mellitus without complication ([place])   . Hyperlipidemia   . Hypertension   .  Unspecified vitamin D deficiency      Allergies  Allergen Reactions  . Adderall [Amphetamine-Dextroamphet Er]     MOOD CHANGE  . Lipitor [Atorvastatin]     MYALGIA  . Wellbutrin [Bupropion]     HA  . Zoloft [Sertraline Hcl]     AGGITATION    Current Outpatient Prescriptions on File Prior to Visit  Medication Sig  . glipiZIDE (GLUCOTROL) 5 MG tablet TAKE ONE TABLET BY MOUTH ONCE DAILY BEFORE BREAKFAST  . meloxicam (MOBIC) 15 MG tablet Take one daily with food for 2 weeks, can take with tylenol, can not take with aleve, iburpofen, then as needed daily for pain  . metFORMIN (GLUCOPHAGE-XR) 500 MG 24 hr tablet Take 4 tablets (2,000 mg total) by mouth daily.  . methylphenidate (CONCERTA) 36 MG PO CR tablet Take 1 to 2 tablets daily if needed for alertness. Try to not take daily, more effective Should last 12 wks  . olmesartan (BENICAR) 40 MG tablet Take 1 tablet (40 mg total) by mouth daily.  . rosuvastatin (CRESTOR) 20 MG tablet TAKE ONE TABLET BY MOUTH ONCE DAILY FOR CHOLESTEROL  . Vitamin D, Ergocalciferol, (DRISDOL) 50000 units CAPS capsule TAKE 1 CAPSULE BY MOUTH 3 TIMES PER WEEK.  . sildenafil (VIAGRA) 100 MG tablet Take 1 tablet (100 mg total) by mouth as needed  for erectile dysfunction.   No current facility-administered medications on file prior to visit.     ROS: all negative except above.   Physical Exam: Filed Weights   10/29/16 0904  Weight: 232 lb 3.2 oz (105.3 kg)   BP 126/80   Pulse (!) 106   Temp 97.3 F (36.3 C)   Resp 16   Ht 5\' 7"  (1.702 m)   Wt 232 lb 3.2 oz (105.3 kg)   SpO2 95%   BMI 36.37 kg/m  General Appearance: Well nourished, in no apparent distress. Eyes: PERRLA, EOMs, conjunctiva no swelling or erythema Sinuses: No Frontal/maxillary tenderness ENT/Mouth: Ext aud canals clear, TMs without erythema, bulging. No erythema, swelling, or exudate on post pharynx.  Tonsils not swollen or erythematous. Hearing normal.  Neck: Supple, thyroid normal.    Respiratory: Respiratory effort normal, BS equal bilaterally without rales, rhonchi, wheezing or stridor.  Cardio: RRR with no MRGs. Brisk peripheral pulses without edema.  Abdomen: Soft, + BS.  Non tender, no guarding, rebound, hernias, masses. Lymphatics: Non tender without lymphadenopathy.  Musculoskeletal: Full ROM, 5/5 strength, normal gait.  Skin: Warm, dry without rashes, lesions, ecchymosis.  Neuro: Cranial nerves intact. Normal muscle tone, no cerebellar symptoms. Sensation intact.  Psych: Awake and oriented X 3, normal affect, Insight and Judgment appropriate.     Vicie Mutters, PA-C 9:08 AM [facility]

## 2016-11-15 ENCOUNTER — Encounter: Payer: Self-pay | Admitting: Internal Medicine

## 2016-12-20 ENCOUNTER — Ambulatory Visit (INDEPENDENT_AMBULATORY_CARE_PROVIDER_SITE_OTHER): Payer: BLUE CROSS/BLUE SHIELD | Admitting: Internal Medicine

## 2016-12-20 ENCOUNTER — Other Ambulatory Visit: Payer: Self-pay | Admitting: *Deleted

## 2016-12-20 VITALS — BP 112/70 | HR 80 | Resp 16 | Ht 67.0 in | Wt 230.8 lb

## 2016-12-20 DIAGNOSIS — Z136 Encounter for screening for cardiovascular disorders: Secondary | ICD-10-CM

## 2016-12-20 DIAGNOSIS — Z111 Encounter for screening for respiratory tuberculosis: Secondary | ICD-10-CM

## 2016-12-20 DIAGNOSIS — Z1212 Encounter for screening for malignant neoplasm of rectum: Secondary | ICD-10-CM

## 2016-12-20 DIAGNOSIS — R5383 Other fatigue: Secondary | ICD-10-CM

## 2016-12-20 DIAGNOSIS — Z125 Encounter for screening for malignant neoplasm of prostate: Secondary | ICD-10-CM | POA: Diagnosis not present

## 2016-12-20 DIAGNOSIS — Z79899 Other long term (current) drug therapy: Secondary | ICD-10-CM | POA: Diagnosis not present

## 2016-12-20 DIAGNOSIS — Z0000 Encounter for general adult medical examination without abnormal findings: Secondary | ICD-10-CM

## 2016-12-20 DIAGNOSIS — E782 Mixed hyperlipidemia: Secondary | ICD-10-CM

## 2016-12-20 DIAGNOSIS — E559 Vitamin D deficiency, unspecified: Secondary | ICD-10-CM

## 2016-12-20 DIAGNOSIS — I10 Essential (primary) hypertension: Secondary | ICD-10-CM | POA: Diagnosis not present

## 2016-12-20 DIAGNOSIS — N182 Chronic kidney disease, stage 2 (mild): Secondary | ICD-10-CM

## 2016-12-20 DIAGNOSIS — Z0001 Encounter for general adult medical examination with abnormal findings: Secondary | ICD-10-CM

## 2016-12-20 DIAGNOSIS — E1122 Type 2 diabetes mellitus with diabetic chronic kidney disease: Secondary | ICD-10-CM

## 2016-12-20 LAB — BASIC METABOLIC PANEL WITH GFR
BUN: 21 mg/dL (ref 7–25)
CO2: 24 mmol/L (ref 20–31)
Calcium: 9.8 mg/dL (ref 8.6–10.3)
Chloride: 102 mmol/L (ref 98–110)
Creat: 1.04 mg/dL (ref 0.70–1.25)
GFR, Est African American: 85 mL/min (ref >=60)
GFR, Est Non African American: 73 mL/min (ref >=60)
GLUCOSE: 105 mg/dL — AB (ref 65–99)
Potassium: 4.4 mmol/L (ref 3.5–5.3)
Sodium: 137 mmol/L (ref 135–146)

## 2016-12-20 LAB — CBC WITH DIFFERENTIAL/PLATELET
BASOS ABS: 0 {cells}/uL (ref 0–200)
Basophils Relative: 0 %
EOS ABS: 103 {cells}/uL (ref 15–500)
Eosinophils Relative: 1 %
HEMATOCRIT: 44.7 % (ref 38.5–50.0)
HEMOGLOBIN: 15.4 g/dL (ref 13.2–17.1)
LYMPHS ABS: 2163 {cells}/uL (ref 850–3900)
LYMPHS PCT: 21 %
MCH: 31.1 pg (ref 27.0–33.0)
MCHC: 34.5 g/dL (ref 32.0–36.0)
MCV: 90.3 fL (ref 80.0–100.0)
MONO ABS: 927 {cells}/uL (ref 200–950)
MPV: 9.2 fL (ref 7.5–12.5)
Monocytes Relative: 9 %
NEUTROS PCT: 69 %
Neutro Abs: 7107 cells/uL (ref 1500–7800)
Platelets: 342 10*3/uL (ref 140–400)
RBC: 4.95 MIL/uL (ref 4.20–5.80)
RDW: 13.1 % (ref 11.0–15.0)
WBC: 10.3 10*3/uL (ref 3.8–10.8)

## 2016-12-20 LAB — LIPID PANEL
CHOLESTEROL: 193 mg/dL (ref <200)
HDL: 66 mg/dL (ref >40)
LDL CALC: 101 mg/dL — AB (ref <100)
Total CHOL/HDL Ratio: 2.9 Ratio (ref <5.0)
Triglycerides: 130 mg/dL (ref <150)
VLDL: 26 mg/dL (ref <30)

## 2016-12-20 LAB — HEPATIC FUNCTION PANEL
ALBUMIN: 4.5 g/dL (ref 3.6–5.1)
ALT: 21 U/L (ref 9–46)
AST: 16 U/L (ref 10–35)
Alkaline Phosphatase: 62 U/L (ref 40–115)
BILIRUBIN INDIRECT: 0.3 mg/dL (ref 0.2–1.2)
Bilirubin, Direct: 0.1 mg/dL (ref <=0.2)
TOTAL PROTEIN: 7.2 g/dL (ref 6.1–8.1)
Total Bilirubin: 0.4 mg/dL (ref 0.2–1.2)

## 2016-12-20 LAB — IRON AND TIBC
%SAT: 49 % (ref 15–60)
Iron: 181 ug/dL — ABNORMAL HIGH (ref 50–180)
TIBC: 371 ug/dL (ref 250–425)
UIBC: 190 ug/dL

## 2016-12-20 LAB — TSH: TSH: 1.95 mIU/L (ref 0.40–4.50)

## 2016-12-20 MED ORDER — ROSUVASTATIN CALCIUM 20 MG PO TABS: ORAL_TABLET | ORAL | 3 refills | Status: DC

## 2016-12-20 MED ORDER — METFORMIN HCL ER 500 MG PO TB24: 2000.0000 mg | ORAL_TABLET | Freq: Every day | ORAL | 3 refills | Status: DC

## 2016-12-20 NOTE — Patient Instructions (Signed)

## 2016-12-20 NOTE — Progress Notes (Signed)
[facility]   Unk Pinto, M.D.      [name]. [name], P.A.-C [facility]                [address] [place] [address] Telephone [phone] Telefax [phone] Annual  Screening/Preventative Visit  & Comprehensive Evaluation & Examination     This very nice 66 y.o. [name] presents for a Screening/Preventative Visit & comprehensive evaluation and management of multiple medical co-morbidities.  Patient has been followed for HTN, T2_NIDDM  Prediabetes, Hyperlipidemia and Vitamin D Deficiency. Patient also has inattentive type of ADD improved on Concerta with increased alertness, focusing and ability to concentrate.      HTN predates circa 1990's. Patient's BP has been controlled at home.  Today's BP is at goal - 112/70. Patient denies any cardiac symptoms as chest pain, palpitations, shortness of breath, dizziness or ankle swelling.     Patient's hyperlipidemia is controlled with diet and medications. Patient denies myalgias or other medication SE's. Last lipids were at goal albeit sl elevated Trig's: Lab Results  Component Value Date   CHOL 196 08/22/2016   HDL 61 08/22/2016   LDLCALC 97 08/22/2016   TRIG 191 (H) 08/22/2016   CHOLHDL 3.2 08/22/2016      Patient has Morbid Obesity (BMI 36+) and T2_NIDDM since 2010 and patient denies reactive hypoglycemic symptoms, visual blurring, diabetic polys or paresthesias. Dietary compliance is poor with last A1c was not at goal: Lab Results  Component Value Date   HGBA1C 11.4 (H) 08/22/2016       Finally, patient has history of Vitamin D Deficiency ("26" in 2008  and last vitamin D was at goal: Lab Results  Component Value Date   VD25OH 68 08/22/2016   Medication Sig  . glipiZIDE (GLUCOTROL) 5 MG tablet TAKE ONE TABLET BY MOUTH THREE TIMES A DAY WITH FOOD  . meloxicam (MOBIC) 15 MG tablet Take one daily with food for 2 weeks, can take with  tylenol, can not take with aleve, iburpofen, then as needed daily for pain  . methylphenidate (CONCERTA) 36 MG PO CR tablet Take 1 to 2 tablets daily if needed for alertness. Try to not take daily, more effective Should last 12 wks  . olmesartan (BENICAR) 40 MG tablet Take 1 tablet (40 mg total) by mouth daily.  . Vitamin D 50000 units  TAKE 1 CAPSULE BY MOUTH 3 TIMES PER WEEK.  . metFORMIN-XR 500 MG 24 hr tablet Take 4 tablets (2,000 mg total) by mouth daily.  . rosuvastatin (CRESTOR) 20 MG tablet TAKE ONE TABLET BY MOUTH ONCE DAILY FOR CHOLESTEROL  . sildenafil (VIAGRA) 100 MG tablet Take 1 tablet (100 mg total) by mouth as needed for erectile dysfunction.   Allergies  Allergen Reactions  . Adderall [Amphetamine-Dextroamphet Er]     MOOD CHANGE  . Lipitor [Atorvastatin]     MYALGIA  . Wellbutrin [Bupropion]     HA  . Zoloft [Sertraline Hcl]     AGGITATION   Past Medical History:  Diagnosis Date  . ADD (attention deficit disorder)   . Anxiety   . Depression   . Diabetes mellitus without complication ([place])   . Hyperlipidemia   . Hypertension   . Unspecified vitamin D deficiency    Health Maintenance  Topic Date Due  . Hepatitis C Screening  [birth year]  . OPHTHALMOLOGY EXAM  06/13/2013  .  PNA vac Low Risk Adult (1 of 2 - PCV13) 09/26/2013  . FOOT EXAM  10/27/2016  . INFLUENZA VACCINE  05/17/2019 (Originally 02/27/2017)  . HEMOGLOBIN A1C  02/19/2017  . COLONOSCOPY  06/12/2021  . TETANUS/TDAP  09/16/2024   Immunization History  Administered Date(s) Administered  . DT 09/16/2014  . PPD Test 09/16/2014, 10/28/2015  . Pneumococcal-Unspecified 07/31/1999  . Td 07/31/2003  . Zoster 05/26/2013   Family History  Problem Relation Age of Onset  . Hypertension Mother   . Cancer Mother        BREAST  . Hypertension Father   . Hypertension Sister   . Hyperlipidemia Sister    Social History  . Marital status: Married    Spouse name: N/A  . Number of children: N/A  . Years  of education: N/A   Occupational History  . 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 Smoking status: Former Smoker    Quit date: 09/30/1963  . Smokeless tobacco: Current User    Types: Chew     Comment: Cigars  . Alcohol use No  . Drug use: No  . Sexual activity: No    ROS Constitutional: Denies fever, chills, weight loss/gain, headaches, insomnia,  night sweats or change in appetite. Does c/o fatigue. Eyes: Denies redness, blurred vision, diplopia, discharge, itchy or watery eyes.  ENT: Denies discharge, congestion, post nasal drip, epistaxis, sore throat, earache, hearing loss, dental pain, Tinnitus, Vertigo, Sinus pain or snoring.  Cardio: Denies chest pain, palpitations, irregular heartbeat, syncope, dyspnea, diaphoresis, orthopnea, PND, claudication or edema Respiratory: denies cough, dyspnea, DOE, pleurisy, hoarseness, laryngitis or wheezing.  Gastrointestinal: Denies dysphagia, heartburn, reflux, water brash, pain, cramps, nausea, vomiting, bloating, diarrhea, constipation, hematemesis, melena, hematochezia, jaundice or hemorrhoids Genitourinary: Denies dysuria, frequency, urgency, nocturia, hesitancy, discharge, hematuria or flank pain Musculoskeletal: Denies arthralgia, myalgia, stiffness, Jt. Swelling, pain, limp or strain/sprain. Denies Falls. Skin: Denies puritis, rash, hives, warts, acne, eczema or change in skin lesion Neuro: No weakness, tremor, incoordination, spasms, paresthesia or pain Psychiatric: Denies confusion, memory loss or sensory loss. Denies Depression. Endocrine: Denies change in weight, skin, hair change, nocturia, and paresthesia, diabetic polys, visual blurring or hyper / hypo glycemic episodes.  Heme/Lymph: No excessive bleeding, bruising or enlarged lymph nodes.  Physical Exam  BP 112/70   Pulse 80   Resp 16   Ht 5\' 7"  (1.702 m)   Wt 230 lb 12.8 oz (104.7 kg)   BMI 36.15 kg/m   General Appearance: Over  nourished and well groomed and in no apparent distress.  Eyes: PERRLA, EOMs, conjunctiva no swelling or erythema, normal fundi and vessels. Sinuses: No frontal/maxillary tenderness ENT/Mouth: EACs patent / TMs  nl. Nares clear without erythema, swelling, mucoid exudates. Oral hygiene is good. No erythema, swelling, or exudate. Tongue normal, non-obstructing. Tonsils not swollen or erythematous. Hearing normal.  Neck: Supple, thyroid normal. No bruits, nodes or JVD. Respiratory: Respiratory effort normal.  BS equal and clear bilateral without rales, rhonci, wheezing or stridor. Cardio: Heart sounds are normal with regular rate and rhythm and no murmurs, rubs or gallops. Peripheral pulses are normal and equal bilaterally without edema. No aortic or femoral bruits. Chest: symmetric with normal excursions and percussion.  Abdomen: Soft, with Nl bowel sounds. Nontender, no guarding, rebound, hernias, masses, or organomegaly.  Lymphatics: Non tender without lymphadenopathy.  Genitourinary: No hernias.Testes nl. DRE - prostate nl for age - smooth & firm w/o nodules. Musculoskeletal: Full ROM all  peripheral extremities, joint stability, 5/5 strength, and normal gait. Skin: Warm and dry without rashes, lesions, cyanosis, clubbing or  ecchymosis.  Neuro: Cranial nerves intact, reflexes equal bilaterally. Normal muscle tone, no cerebellar symptoms. Sensation intact to touch, vibratory and Monofilament to the toes bilaterally. Pysch: Alert and oriented X 3 with normal affect, insight and judgment appropriate.   Assessment and Plan  1. Annual Preventative/Screening Exam   2. Essential hypertension  - EKG 12-Lead - [place], RETROPERITNL ABD,  LTD - Urinalysis, Routine w reflex microscopic - Microalbumin / creatinine urine ratio - CBC with Differential/Platelet - BASIC METABOLIC PANEL WITH GFR - Magnesium - TSH  3. Hyperlipidemia, mixed  - EKG 12-Lead - [place], RETROPERITNL ABD,  LTD - Hepatic function  panel - Lipid panel - TSH  4. Type 2 diabetes mellitus with stage 2 chronic kidney disease, without long-term current use of insulin (HCC)  - EKG 12-Lead - [place], RETROPERITNL ABD,  LTD - HM DIABETES FOOT EXAM - LOW EXTREMITY NEUR EXAM DOCUM - Hemoglobin A1c - Insulin, random  5. Vitamin D deficiency  - VITAMIN D 25 Hydroxy   6. Screening for rectal cancer  - POC Hemoccult Bld/Stl   7. Prostate cancer screening  - PSA  8. Screening examination for pulmonary tuberculosis   9. Screening for ischemic heart disease  - EKG 12-Lead  10. Screening for AAA (aortic abdominal aneurysm)  - [place], RETROPERITNL ABD,  LTD  11. Fatigue  - Vitamin B12 - Iron and TIBC - Testosterone  12. Medication management  - Urinalysis, Routine w reflex microscopic - Microalbumin / creatinine urine ratio - CBC with Differential/Platelet - BASIC METABOLIC PANEL WITH GFR - Hepatic function panel - Magnesium - Lipid panel - TSH - Hemoglobin A1c - Insulin, random - VITAMIN D 25 Hydroxy        Patient was counseled in prudent diet, weight control to achieve/maintain BMI less than 25, BP monitoring, regular exercise and medications as discussed.  Discussed med effects and SE's. Routine screening labs and tests as requested with regular follow-up as recommended. Over 40 minutes of exam, counseling, chart review and high complex critical decision making was performed

## 2016-12-21 LAB — URINALYSIS, ROUTINE W REFLEX MICROSCOPIC
Bilirubin Urine: NEGATIVE
Glucose, UA: NEGATIVE
HGB URINE DIPSTICK: NEGATIVE
Ketones, ur: NEGATIVE
LEUKOCYTES UA: NEGATIVE
NITRITE: NEGATIVE
PROTEIN: NEGATIVE
Specific Gravity, Urine: 1.01 (ref 1.001–1.035)
pH: 5 (ref 5.0–8.0)

## 2016-12-21 LAB — VITAMIN B12: VITAMIN B 12: 548 pg/mL (ref 200–1100)

## 2016-12-21 LAB — VITAMIN D 25 HYDROXY (VIT D DEFICIENCY, FRACTURES): Vit D, 25-Hydroxy: 67 ng/mL (ref 30–100)

## 2016-12-21 LAB — MICROALBUMIN / CREATININE URINE RATIO
Creatinine, Urine: 39 mg/dL (ref 20–370)
Microalb Creat Ratio: 33 mcg/mg creat — ABNORMAL HIGH (ref <30)
Microalb, Ur: 1.3 mg/dL

## 2016-12-21 LAB — TESTOSTERONE: Testosterone: 350 ng/dL (ref 250–827)

## 2016-12-21 LAB — HEMOGLOBIN A1C
HEMOGLOBIN A1C: 8.2 % — AB (ref <5.7)
MEAN PLASMA GLUCOSE: 189 mg/dL

## 2016-12-21 LAB — INSULIN, RANDOM: Insulin: 9 u[IU]/mL (ref 2.0–19.6)

## 2016-12-21 LAB — MAGNESIUM: Magnesium: 2.2 mg/dL (ref 1.5–2.5)

## 2016-12-21 LAB — PSA: PSA: 2.9 ng/mL (ref <=4.0)

## 2016-12-22 ENCOUNTER — Encounter: Payer: Self-pay | Admitting: Internal Medicine

## 2016-12-25 ENCOUNTER — Other Ambulatory Visit: Payer: Self-pay | Admitting: *Deleted

## 2016-12-25 MED ORDER — BLOOD GLUCOSE MONITOR KIT: PACK | 0 refills | Status: DC

## 2016-12-25 MED ORDER — GLUCOSE BLOOD VI STRP: ORAL_STRIP | 6 refills | Status: DC

## 2017-04-15 NOTE — Progress Notes (Signed)
Assessment and Plan:   Hypertension -Continue medication, monitor blood pressure at home. Continue DASH diet.  Reminder to go to the ER if any CP, SOB, nausea, dizziness, severe HA, changes vision/speech, left arm numbness and tingling and jaw pain.  Cholesterol -Continue diet and exercise. Check cholesterol.   Diabetes with diabetic chronic kidney disease -Continue diet and exercise. Check A1C - may need to do glipizide TID declines insulin due to DOT - will add protein shake/slim fast for meal replacement - poor insight to disease  Vitamin D Def - check level and continue medications.   Morbid Obesity with co morbidities - long discussion about weight loss, diet, and exercise  ADD Will fill for 3 months but very long conversation with the patient that due to new guidelines we may not be able to do this in the future and that the medication is better if he does NOT take daily.   Continue diet and meds as discussed. Further disposition pending results of labs. Discussed med's effects and SE's.   Over 30 minutes of exam, counseling, chart review, and critical decision making was performed   HPI 66 y.o. male  presents for 3 month follow up on hypertension, cholesterol, diabetes and vitamin D deficiency.   His blood pressure has been controlled at home, today his BP is BP: 130/84.  He does not workout. He denies chest pain, shortness of breath, dizziness.  He is on cholesterol medication, crestor 46m, and denies myalgias. His cholesterol is at goal. 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 Last A1C was:  Lab Results  Component Value Date   HGBA1C 8.2 (H) 12/20/2016    Patient is on Vitamin D supplement. Lab Results  Component Value Date   VD25OH 67 12/20/2016   BMI is Body mass index is 36.77 kg/m., he is working on diet and exercise. Wt Readings from Last 3 Encounters:  04/17/17 234 lb 12.8 oz (106.5 kg)  12/20/16 230 lb 12.8 oz (104.7 kg)  10/29/16 232 lb 3.2 oz (105.3 kg)   Patient is on an ADD medication, he states that the medication is helping and he denies any adverse reactions, since he works 6-7 days a work, most days he takes it.    Current Medications:  Current Outpatient Prescriptions on File Prior to Visit  Medication Sig Dispense Refill  . blood glucose meter kit and supplies KIT Dispense based on patient's  insurance preference, [employer] Elite-E11.22 1 each 0  . glipiZIDE (GLUCOTROL) 5 MG tablet TAKE ONE TABLET BY MOUTH THREE TIMES A DAY WITH FOOD 270 tablet 1  . glucose blood test strip Check blood sugar 1 time daily- DX E11.22. Dispense [employer]ia Elite strips. 100 each 6  . meloxicam (MOBIC) 15 MG tablet Take one daily with food for 2 weeks, can take with  tylenol, can not take with aleve, iburpofen, then as needed daily for pain 30 tablet 1  . metFORMIN (GLUCOPHAGE-XR) 500 MG 24 hr tablet Take 4 tablets (2,000 mg total) by mouth daily. 360 tablet 3  . olmesartan (BENICAR) 40 MG tablet Take 1 tablet (40 mg total) by mouth daily. 30 tablet 3  . rosuvastatin (CRESTOR) 20 MG tablet TAKE ONE TABLET BY MOUTH ONCE DAILY FOR CHOLESTEROL 90 tablet 3  . Vitamin D, Ergocalciferol, (DRISDOL) 50000 units CAPS capsule TAKE 1 CAPSULE BY MOUTH 3 TIMES PER WEEK. 36 capsule 1  . sildenafil (VIAGRA) 100 MG tablet Take 1 tablet (100 mg total) by mouth as needed for erectile  dysfunction. 10 tablet 1   No current facility-administered medications on file prior to visit.    Medical History:  Past Medical History:  Diagnosis Date  . ADD (attention deficit disorder)   . Anxiety   . Depression   . Diabetes mellitus without complication ([place])   . Hyperlipidemia   . Hypertension   . Unspecified vitamin D deficiency    Allergies:  Allergies  Allergen Reactions  . Adderall [Amphetamine-Dextroamphet Er]     MOOD CHANGE  . Lipitor [Atorvastatin]     MYALGIA  . Wellbutrin [Bupropion]     HA  . Zoloft [Sertraline Hcl]     AGGITATION     Review of Systems:  Review of Systems  Constitutional: Negative.   HENT: Negative.   Eyes: Negative.   Respiratory: Negative.   Cardiovascular: Negative.   Gastrointestinal: Negative.   Genitourinary: Negative.   Musculoskeletal: Positive for joint pain ( right knee pain). Negative for back pain, falls, myalgias and neck pain.  Skin: Negative.   Neurological: Negative.   Endo/Heme/Allergies: Negative.   Psychiatric/Behavioral: Negative.     Family history- Review and unchanged Social history- Review and unchanged Physical Exam: BP 130/84   Pulse 91   Temp (!) 97.5 F (36.4 C)   Resp 18   Ht _0  (1.702 m)   Wt 234 lb 12.8 oz (106.5 kg)   SpO2 97%   BMI 36.77 kg/m  Wt Readings from Last 3 Encounters:  04/17/17 234 lb 12.8 oz (106.5 kg)  12/20/16 230 lb 12.8 oz (104.7 kg)  10/29/16 232 lb 3.2 oz (105.3 kg)   General Appearance: Well nourished, in no apparent distress. Eyes: PERRLA, EOMs, conjunctiva no swelling or erythema Sinuses: No Frontal/maxillary tenderness ENT/Mouth: Ext aud canals clear, TMs without erythema, bulging. No erythema, swelling, or exudate on post pharynx.  Tonsils not swollen or erythematous. Hearing normal.  Neck: Supple, thyroid normal.  Respiratory: Respiratory effort normal, BS equal bilaterally without rales, rhonchi, wheezing or stridor.  Cardio: RRR with no MRGs. Brisk  peripheral pulses without edema.  Abdomen: Soft, + BS, obese, Non tender, no guarding, rebound, hernias, masses. Lymphatics: Non tender without lymphadenopathy.  Musculoskeletal: Full ROM, 5/5 strength, Normal gait Skin: Warm, dry without rashes, lesions, ecchymosis.  Neuro: Cranial nerves intact. No cerebellar symptoms.  Psych: Awake and oriented X 3, normal affect, Insight and Judgment appropriate.    Vicie Mutters, PA-C 8:56 AM [facility]

## 2017-04-17 ENCOUNTER — Other Ambulatory Visit: Payer: Self-pay | Admitting: Internal Medicine

## 2017-04-17 ENCOUNTER — Ambulatory Visit (INDEPENDENT_AMBULATORY_CARE_PROVIDER_SITE_OTHER): Payer: BLUE CROSS/BLUE SHIELD | Admitting: Physician Assistant

## 2017-04-17 ENCOUNTER — Other Ambulatory Visit: Payer: Self-pay

## 2017-04-17 ENCOUNTER — Encounter: Payer: Self-pay | Admitting: Physician Assistant

## 2017-04-17 VITALS — BP 130/84 | HR 91 | Temp 97.5°F | Resp 18 | Ht 67.0 in | Wt 234.8 lb

## 2017-04-17 DIAGNOSIS — N182 Chronic kidney disease, stage 2 (mild): Secondary | ICD-10-CM

## 2017-04-17 DIAGNOSIS — E1122 Type 2 diabetes mellitus with diabetic chronic kidney disease: Secondary | ICD-10-CM | POA: Diagnosis not present

## 2017-04-17 DIAGNOSIS — F900 Attention-deficit hyperactivity disorder, predominantly inattentive type: Secondary | ICD-10-CM

## 2017-04-17 DIAGNOSIS — E782 Mixed hyperlipidemia: Secondary | ICD-10-CM

## 2017-04-17 DIAGNOSIS — F325 Major depressive disorder, single episode, in full remission: Secondary | ICD-10-CM | POA: Diagnosis not present

## 2017-04-17 DIAGNOSIS — Z79899 Other long term (current) drug therapy: Secondary | ICD-10-CM | POA: Diagnosis not present

## 2017-04-17 DIAGNOSIS — I10 Essential (primary) hypertension: Secondary | ICD-10-CM | POA: Diagnosis not present

## 2017-04-17 MED ORDER — VITAMIN D (ERGOCALCIFEROL) 1.25 MG (50000 UNIT) PO CAPS: ORAL_CAPSULE | ORAL | 1 refills | Status: DC

## 2017-04-17 MED ORDER — METHYLPHENIDATE HCL ER (OSM) 36 MG PO TBCR: EXTENDED_RELEASE_TABLET | ORAL | 0 refills | Status: DC

## 2017-04-17 MED ORDER — KETOCONAZOLE 2 % EX CREA: 1.0000 "application " | TOPICAL_CREAM | Freq: Two times a day (BID) | CUTANEOUS | 0 refills | Status: DC

## 2017-04-17 NOTE — Patient Instructions (Signed)
We are starting you on Metformin to prevent or treat diabetes. Metformin does not cause low blood sugars. In order to create energy your cells need insulin and sugar but sometime your cells do not accept the insulin and this can cause increased sugars and decreased energy. The Metformin helps your cells accept insulin and the sugar to give you more energy.   The two most common side effects are nausea and diarrhea, follow these rules to avoid it! You can take imodium per box instructions when starting metformin if needed.   Rules of metformin: 1) start out slow with only one pill daily. Our goal for you is 4 pills a day or 2000mg  total.  2) take with your largest meal. 3) Take with least amount of carbs.   Call if you have any problems.    Your A1C is a measure of your sugar over the past 3 months and is not affected by what you have eaten over the past few days. Diabetes increases your chances of stroke and heart attack over 300 % and is the leading cause of blindness and kidney failure in the [place]. Please make sure you decrease bad carbs like white bread, white rice, potatoes, corn, soft drinks, pasta, cereals, refined sugars, sweet tea, dried fruits, and fruit juice. Good carbs are okay to eat in moderation like sweet potatoes, brown rice, whole grain pasta/bread, most fruit (except dried fruit) and you can eat as many veggies as you want.   Greater than 6.5 is considered diabetic. Between 6.4 and 5.7 is prediabetic If your A1C is less than 5.7 you are NOT diabetic.  Targets for Glucose Readings: Time of Check Target for patients WITHOUT Diabetes Target for DIABETICS  Before Meals Less than 100  less than 150  Two hours after meals Less than 200  Less than 250       Bad carbs also include fruit juice, alcohol, and sweet tea. These are empty calories that do not signal to your brain that you are full.   Please remember the good carbs are still carbs which convert into sugar. So  please measure them out no more than 1/2-1 cup of rice, oatmeal, pasta, and beans  Veggies are however free foods! Pile them on.   Not all fruit is created equal. Please see the list below, the fruit at the bottom is higher in sugars than the fruit at the top. Please avoid all dried fruits.

## 2017-04-18 LAB — CBC WITH DIFFERENTIAL/PLATELET
BASOS ABS: 41 {cells}/uL (ref 0–200)
BASOS PCT: 0.5 %
EOS PCT: 1.8 %
Eosinophils Absolute: 148 cells/uL (ref 15–500)
HCT: 47.5 % (ref 38.5–50.0)
HEMOGLOBIN: 15.8 g/dL (ref 13.2–17.1)
Lymphs Abs: 2099 cells/uL (ref 850–3900)
MCH: 30.5 pg (ref 27.0–33.0)
MCHC: 33.3 g/dL (ref 32.0–36.0)
MCV: 91.7 fL (ref 80.0–100.0)
MONOS PCT: 9 %
MPV: 9.8 fL (ref 7.5–12.5)
NEUTROS ABS: 5174 {cells}/uL (ref 1500–7800)
Neutrophils Relative %: 63.1 %
Platelets: 312 10*3/uL (ref 140–400)
RBC: 5.18 10*6/uL (ref 4.20–5.80)
RDW: 12 % (ref 11.0–15.0)
TOTAL LYMPHOCYTE: 25.6 %
WBC mixed population: 738 cells/uL (ref 200–950)
WBC: 8.2 10*3/uL (ref 3.8–10.8)

## 2017-04-18 LAB — LIPID PANEL
CHOL/HDL RATIO: 2.6 (calc) (ref <5.0)
CHOLESTEROL: 172 mg/dL (ref <200)
HDL: 66 mg/dL (ref >40)
LDL CHOLESTEROL (CALC): 79 mg/dL
Non-HDL Cholesterol (Calc): 106 mg/dL (calc) (ref <130)
Triglycerides: 167 mg/dL — ABNORMAL HIGH (ref <150)

## 2017-04-18 LAB — BASIC METABOLIC PANEL WITH GFR
BUN: 20 mg/dL (ref 7–25)
CHLORIDE: 100 mmol/L (ref 98–110)
CO2: 26 mmol/L (ref 20–32)
CREATININE: 1.14 mg/dL (ref 0.70–1.25)
Calcium: 9.7 mg/dL (ref 8.6–10.3)
GFR, Est African American: 76 mL/min/{1.73_m2} (ref >=60)
GFR, Est Non African American: 66 mL/min/{1.73_m2} (ref >=60)
GLUCOSE: 187 mg/dL — AB (ref 65–99)
Potassium: 5.2 mmol/L (ref 3.5–5.3)
SODIUM: 136 mmol/L (ref 135–146)

## 2017-04-18 LAB — HEPATIC FUNCTION PANEL
AG RATIO: 1.7 (calc) (ref 1.0–2.5)
ALKALINE PHOSPHATASE (APISO): 68 U/L (ref 40–115)
ALT: 18 U/L (ref 9–46)
AST: 13 U/L (ref 10–35)
Albumin: 4.4 g/dL (ref 3.6–5.1)
BILIRUBIN INDIRECT: 0.3 mg/dL (ref 0.2–1.2)
Bilirubin, Direct: 0.1 mg/dL (ref 0.0–0.2)
Globulin: 2.6 g/dL (calc) (ref 1.9–3.7)
Total Bilirubin: 0.4 mg/dL (ref 0.2–1.2)
Total Protein: 7 g/dL (ref 6.1–8.1)

## 2017-04-18 LAB — HEMOGLOBIN A1C
EAG (MMOL/L): 11.2 (calc)
HEMOGLOBIN A1C: 8.7 %{Hb} — AB (ref <5.7)
Mean Plasma Glucose: 203 (calc)

## 2017-04-18 LAB — MAGNESIUM: Magnesium: 2.1 mg/dL (ref 1.5–2.5)

## 2017-04-18 LAB — TSH: TSH: 1.71 mIU/L (ref 0.40–4.50)

## 2017-04-18 NOTE — Progress Notes (Signed)
Pt aware of lab results & voiced understanding of those results.

## 2017-05-30 ENCOUNTER — Other Ambulatory Visit: Payer: Self-pay | Admitting: *Deleted

## 2017-05-30 MED ORDER — OLMESARTAN MEDOXOMIL 40 MG PO TABS: 40.0000 mg | ORAL_TABLET | Freq: Every day | ORAL | 3 refills | Status: DC

## 2017-07-05 ENCOUNTER — Other Ambulatory Visit: Payer: Self-pay

## 2017-07-05 DIAGNOSIS — F900 Attention-deficit hyperactivity disorder, predominantly inattentive type: Secondary | ICD-10-CM

## 2017-07-05 MED ORDER — METHYLPHENIDATE HCL ER (OSM) 36 MG PO TBCR: EXTENDED_RELEASE_TABLET | ORAL | 0 refills | Status: DC

## 2017-07-17 ENCOUNTER — Ambulatory Visit: Payer: BLUE CROSS/BLUE SHIELD | Admitting: Internal Medicine

## 2017-07-17 ENCOUNTER — Encounter: Payer: Self-pay | Admitting: Internal Medicine

## 2017-07-17 VITALS — BP 140/76 | HR 80 | Temp 97.9°F | Resp 18 | Ht 67.0 in | Wt 235.0 lb

## 2017-07-17 DIAGNOSIS — E782 Mixed hyperlipidemia: Secondary | ICD-10-CM | POA: Diagnosis not present

## 2017-07-17 DIAGNOSIS — Z79899 Other long term (current) drug therapy: Secondary | ICD-10-CM | POA: Diagnosis not present

## 2017-07-17 DIAGNOSIS — E1122 Type 2 diabetes mellitus with diabetic chronic kidney disease: Secondary | ICD-10-CM | POA: Diagnosis not present

## 2017-07-17 DIAGNOSIS — E559 Vitamin D deficiency, unspecified: Secondary | ICD-10-CM

## 2017-07-17 DIAGNOSIS — N182 Chronic kidney disease, stage 2 (mild): Secondary | ICD-10-CM | POA: Diagnosis not present

## 2017-07-17 DIAGNOSIS — F900 Attention-deficit hyperactivity disorder, predominantly inattentive type: Secondary | ICD-10-CM | POA: Diagnosis not present

## 2017-07-17 DIAGNOSIS — I10 Essential (primary) hypertension: Secondary | ICD-10-CM

## 2017-07-17 NOTE — Patient Instructions (Signed)

## 2017-07-17 NOTE — Progress Notes (Signed)
This very nice 66 y.o. Campbell presents for 6 month follow up with Hypertension, Hyperlipidemia, Pre-Diabetes and Vitamin D Deficiency. Patient take Concerta for his inattentive type ADD with improved focus & concentration.      Patient is treated for HTN & BP has been controlled at home. Today's BP is at goal - 140/76. Patient has had no complaints of any cardiac type chest pain, palpitations, dyspnea / orthopnea / PND, dizziness, claudication, or dependent edema.     Hyperlipidemia is controlled with diet & meds. Patient denies myalgias or other med SE's. Last Lipids were neal goal w/sl elevated Trig's: Lab Results  Component Value Date   CHOL 172 04/17/2017   HDL 66 04/17/2017   LDLCALC 101 (H) 12/20/2016   TRIG 167 (H) 04/17/2017   CHOLHDL 2.6 04/17/2017      Also, the patient has history of  Morbid Obesity (BMI 36+) and consequent T2_NIDDM (2010) and has been poorly compliant with diet with compulsive overeating as well as admitted poor food choices.  Nonetheless, he denies  symptoms of reactive hypoglycemia, diabetic polys, paresthesias or visual blurring.  Last A1c was not at goal: Lab Results  Component Value Date   HGBA1C 8.7 (H) 04/17/2017      Further, the patient also has history of Vitamin D Deficiency ("26"/2008) and he supplements vitamin D without any suspected side-effects. Last vitamin D was at goal:  Lab Results  Component Value Date   VD25OH 67 12/20/2016   Current Outpatient Medications on File Prior to Visit  Medication Sig  . blood glucose meter kit and supplies KIT Dispense based on patient's  insurance preference, [employer] Elite-E11.22  . FREESTYLE LITE test strip CHECK GLUCOSE THREE TIMES DAILY  . glipiZIDE (GLUCOTROL) 5 MG tablet TAKE ONE TABLET BY MOUTH THREE TIMES A DAY WITH FOOD  . ketoconazole (NIZORAL) 2 % cream Apply 1 application topically 2 (two) times daily.  . Lancets (FREESTYLE) lancets TEST BLOOD SUGAR THREE TIMES DAILY  . meloxicam (MOBIC) 15  MG tablet Take one daily with food for 2 weeks, can take with tylenol, can not take with aleve, iburpofen, then as needed daily for pain  . metFORMIN (GLUCOPHAGE-XR) 500 MG 24 hr tablet Take 4 tablets (2,000 mg total) by mouth daily.  . methylphenidate (CONCERTA) 36 MG PO CR tablet Take 1 to 2 tablets daily if needed for alertness. Try to not take daily, more effective Should last 12 wks  . olmesartan (BENICAR) 40 MG tablet Take 1 tablet (40 mg total) by mouth daily.  . rosuvastatin (CRESTOR) 20 MG tablet TAKE ONE TABLET BY MOUTH ONCE DAILY FOR CHOLESTEROL  . Vitamin D, Ergocalciferol, (DRISDOL) 50000 units CAPS capsule TAKE 1 CAPSULE BY MOUTH 3 TIMES PER WEEK.  . sildenafil (VIAGRA) 100 MG tablet Take 1 tablet (100 mg total) by mouth as needed for erectile dysfunction.   No current facility-administered medications on file prior to visit.    Allergies  Allergen Reactions  . Adderall [Amphetamine-Dextroamphet Er]     MOOD CHANGE  . Lipitor [Atorvastatin]     MYALGIA  . Wellbutrin [Bupropion]     HA  . Zoloft [Sertraline Hcl]     AGGITATION   PMHx:   Past Medical History:  Diagnosis Date  . ADD (attention deficit disorder)   . Anxiety   . Depression   . Diabetes mellitus without complication ([place])   . Hyperlipidemia   . Hypertension   . Unspecified vitamin D deficiency  Immunization History  Administered Date(s) Administered  . DT 09/16/2014  . PPD Test 09/16/2014, 10/28/2015  . Pneumococcal-Unspecified 07/31/1999  . Td 07/31/2003  . Zoster 05/26/2013   No past surgical history on file. FHx:    Reviewed / unchanged  SHx:    Reviewed / unchanged  Systems Review:  Constitutional: Denies fever, chills, wt changes, headaches, insomnia, fatigue, night sweats, change in appetite. Eyes: Denies redness, blurred vision, diplopia, discharge, itchy, watery eyes.  ENT: Denies discharge, congestion, post nasal drip, epistaxis, sore throat, earache, hearing loss, dental pain,  tinnitus, vertigo, sinus pain, snoring.  CV: Denies chest pain, palpitations, irregular heartbeat, syncope, dyspnea, diaphoresis, orthopnea, PND, claudication or edema. Respiratory: denies cough, dyspnea, DOE, pleurisy, hoarseness, laryngitis, wheezing.  Gastrointestinal: Denies dysphagia, odynophagia, heartburn, reflux, water brash, abdominal pain or cramps, nausea, vomiting, bloating, diarrhea, constipation, hematemesis, melena, hematochezia  or hemorrhoids. Genitourinary: Denies dysuria, frequency, urgency, nocturia, hesitancy, discharge, hematuria or flank pain. Musculoskeletal: Denies arthralgias, myalgias, stiffness, jt. swelling, pain, limping or strain/sprain.  Skin: Denies pruritus, rash, hives, warts, acne, eczema or change in skin lesion(s). Neuro: No weakness, tremor, incoordination, spasms, paresthesia or pain. Psychiatric: Denies confusion, memory loss or sensory loss. Endo: Denies change in weight, skin or hair change.  Heme/Lymph: No excessive bleeding, bruising or enlarged lymph nodes.  Physical Exam  BP 140/76   Pulse 80   Temp 97.9 F (36.6 C)   Resp 18   Ht '5\' 7"'$  (1.702 m)   Wt 235 lb (106.6 kg)   BMI 36.81 kg/m   Appears well nourished, well groomed  and in no distress.  Eyes: PERRLA, EOMs, conjunctiva no swelling or erythema. Sinuses: No frontal/maxillary tenderness ENT/Mouth: EAC's clear, TM's nl w/o erythema, bulging. Nares clear w/o erythema, swelling, exudates. Oropharynx clear without erythema or exudates. Oral hygiene is good. Tongue normal, non obstructing. Hearing intact.  Neck: Supple. Thyroid nl. Car 2+/2+ without bruits, nodes or JVD. Chest: Respirations nl with BS clear & equal w/o rales, rhonchi, wheezing or stridor.  Cor: Heart sounds normal w/ regular rate and rhythm without sig. murmurs, gallops, clicks or rubs. Peripheral pulses normal and equal  without edema.  Abdomen: Soft & bowel sounds normal. Non-tender w/o guarding, rebound, hernias,  masses or organomegaly.  Lymphatics: Unremarkable.  Musculoskeletal: Full ROM all peripheral extremities, joint stability, 5/5 strength and normal gait.  Skin: Warm, dry without exposed rashes, lesions or ecchymosis apparent.  Neuro: Cranial nerves intact, reflexes equal bilaterally. Sensory-motor testing grossly intact. Tendon reflexes grossly intact.  Pysch: Alert & oriented x 3.  Insight and judgement nl & appropriate. No ideations.  Assessment and Plan:  1. Essential hypertension  - Continue medication, monitor blood pressure at home.  - Continue DASH diet. Reminder to go to the ER if any CP,  SOB, nausea, dizziness, severe HA, changes vision/speech.  - CBC with Differential/Platelet - BASIC METABOLIC PANEL WITH GFR - Magnesium - TSH  2. Hyperlipidemia, mixed  - Continue diet/meds, exercise,& lifestyle modifications.  - Continue monitor periodic cholesterol/liver & renal functions  - Hepatic function panel - Lipid panel - TSH  3. Type 2 diabetes mellitus with stage 2 chronic kidney disease, without long-term current use of insulin (HCC)  - Continue diet, exercise, lifestyle modifications.  - Monitor appropriate labs.  - Hemoglobin A1c - Insulin, random  4. Vitamin D deficiency  - Continue supplementation.  - VITAMIN D Joseph Hydroxy   5. Attention deficit hyperactivity disorder (ADHD), predominantly inattentive type  6. Medication management  - CBC  with Differential/Platelet - BASIC METABOLIC PANEL WITH GFR - Hepatic function panel - Magnesium - Lipid panel - TSH - Hemoglobin A1c - Insulin, random - VITAMIN D Joseph Hydroxy        Discussed  regular exercise, BP monitoring, weight control to achieve/maintain BMI less than Joseph and discussed med and SE's. Recommended labs to assess and monitor clinical status with further disposition pending results of labs. Over 30 minutes of exam, counseling, chart review was performed.

## 2017-07-18 LAB — LIPID PANEL
Cholesterol: 215 mg/dL — ABNORMAL HIGH (ref <200)
HDL: 56 mg/dL (ref >40)
LDL Cholesterol (Calc): 126 mg/dL (calc) — ABNORMAL HIGH
NON-HDL CHOLESTEROL (CALC): 159 mg/dL — AB (ref <130)
Total CHOL/HDL Ratio: 3.8 (calc) (ref <5.0)
Triglycerides: 214 mg/dL — ABNORMAL HIGH (ref <150)

## 2017-07-18 LAB — HEMOGLOBIN A1C
EAG (MMOL/L): 14.6 (calc)
HEMOGLOBIN A1C: 10.8 %{Hb} — AB (ref <5.7)
Mean Plasma Glucose: 263 (calc)

## 2017-07-18 LAB — BASIC METABOLIC PANEL WITH GFR
BUN: 25 mg/dL (ref 7–25)
CO2: 21 mmol/L (ref 20–32)
Calcium: 9.3 mg/dL (ref 8.6–10.3)
Chloride: 98 mmol/L (ref 98–110)
Creat: 1.12 mg/dL (ref 0.70–1.25)
GFR, EST AFRICAN AMERICAN: 78 mL/min/{1.73_m2} (ref >=60)
GFR, EST NON AFRICAN AMERICAN: 67 mL/min/{1.73_m2} (ref >=60)
Glucose, Bld: 482 mg/dL — ABNORMAL HIGH (ref 65–99)
POTASSIUM: 4.9 mmol/L (ref 3.5–5.3)
SODIUM: 131 mmol/L — AB (ref 135–146)

## 2017-07-18 LAB — CBC WITH DIFFERENTIAL/PLATELET
Basophils Absolute: 58 cells/uL (ref 0–200)
Basophils Relative: 0.8 %
Eosinophils Absolute: 202 cells/uL (ref 15–500)
Eosinophils Relative: 2.8 %
HCT: 43.9 % (ref 38.5–50.0)
Hemoglobin: 14.9 g/dL (ref 13.2–17.1)
LYMPHS ABS: 2088 {cells}/uL (ref 850–3900)
MCH: 31.2 pg (ref 27.0–33.0)
MCHC: 33.9 g/dL (ref 32.0–36.0)
MCV: 92 fL (ref 80.0–100.0)
MPV: 10.3 fL (ref 7.5–12.5)
Monocytes Relative: 8.8 %
NEUTROS PCT: 58.6 %
Neutro Abs: 4219 cells/uL (ref 1500–7800)
PLATELETS: 269 10*3/uL (ref 140–400)
RBC: 4.77 10*6/uL (ref 4.20–5.80)
RDW: 11.6 % (ref 11.0–15.0)
Total Lymphocyte: 29 %
WBC: 7.2 10*3/uL (ref 3.8–10.8)
WBCMIX: 634 {cells}/uL (ref 200–950)

## 2017-07-18 LAB — HEPATIC FUNCTION PANEL
AG Ratio: 1.7 (calc) (ref 1.0–2.5)
ALKALINE PHOSPHATASE (APISO): 76 U/L (ref 40–115)
ALT: 16 U/L (ref 9–46)
AST: 11 U/L (ref 10–35)
Albumin: 4 g/dL (ref 3.6–5.1)
BILIRUBIN DIRECT: 0.1 mg/dL (ref 0.0–0.2)
BILIRUBIN TOTAL: 0.4 mg/dL (ref 0.2–1.2)
Globulin: 2.4 g/dL (calc) (ref 1.9–3.7)
Indirect Bilirubin: 0.3 mg/dL (calc) (ref 0.2–1.2)
Total Protein: 6.4 g/dL (ref 6.1–8.1)

## 2017-07-18 LAB — INSULIN, RANDOM: INSULIN: 19.7 u[IU]/mL — AB (ref 2.0–19.6)

## 2017-07-18 LAB — TSH: TSH: 2.17 m[IU]/L (ref 0.40–4.50)

## 2017-07-18 LAB — MAGNESIUM: Magnesium: 2 mg/dL (ref 1.5–2.5)

## 2017-07-18 LAB — VITAMIN D 25 HYDROXY (VIT D DEFICIENCY, FRACTURES): Vit D, 25-Hydroxy: 75 ng/mL (ref 30–100)

## 2017-08-12 ENCOUNTER — Encounter: Payer: Self-pay | Admitting: Internal Medicine

## 2017-08-12 ENCOUNTER — Ambulatory Visit: Payer: BLUE CROSS/BLUE SHIELD | Admitting: Internal Medicine

## 2017-08-12 VITALS — BP 124/82 | HR 80 | Temp 97.3°F | Resp 16 | Ht 67.0 in | Wt 234.8 lb

## 2017-08-12 DIAGNOSIS — N182 Chronic kidney disease, stage 2 (mild): Secondary | ICD-10-CM | POA: Diagnosis not present

## 2017-08-12 DIAGNOSIS — E1122 Type 2 diabetes mellitus with diabetic chronic kidney disease: Secondary | ICD-10-CM

## 2017-08-12 DIAGNOSIS — E1165 Type 2 diabetes mellitus with hyperglycemia: Secondary | ICD-10-CM

## 2017-08-12 NOTE — Patient Instructions (Signed)
Diabetes Mellitus and Nutrition When you have diabetes (diabetes mellitus), it is very important to have healthy eating habits because your blood sugar (glucose) levels are greatly affected by what you eat and drink. Eating healthy foods in the appropriate amounts, at about the same times every day, can help you:  Control your blood glucose.  Lower your risk of heart disease.  Improve your blood pressure.  Reach or maintain a healthy weight.  Every person with diabetes is different, and each person has different needs for a meal plan. Your health care provider may recommend that you work with a diet and nutrition specialist (dietitian) to make a meal plan that is best for you. Your meal plan may vary depending on factors such as:  The calories you need.  The medicines you take.  Your weight.  Your blood glucose, blood pressure, and cholesterol levels.  Your activity level.  Other health conditions you have, such as heart or kidney disease.  How do carbohydrates affect me? Carbohydrates affect your blood glucose level more than any other type of food. Eating carbohydrates naturally increases the amount of glucose in your blood. Carbohydrate counting is a method for keeping track of how many carbohydrates you eat. Counting carbohydrates is important to keep your blood glucose at a healthy level, especially if you use insulin or take certain oral diabetes medicines. It is important to know how many carbohydrates you can safely have in each meal. This is different for every person. Your dietitian can help you calculate how many carbohydrates you should have at each meal and for snack. Foods that contain carbohydrates include:  Bread, cereal, rice, pasta, and crackers.  Potatoes and corn.  Peas, beans, and lentils.  Milk and yogurt.  Fruit and juice.  Desserts, such as cakes, cookies, ice cream, and candy.  How does alcohol affect me? Alcohol can cause a sudden decrease in blood  glucose (hypoglycemia), especially if you use insulin or take certain oral diabetes medicines. Hypoglycemia can be a life-threatening condition. Symptoms of hypoglycemia (sleepiness, dizziness, and confusion) are similar to symptoms of having too much alcohol. If your health care provider says that alcohol is safe for you, follow these guidelines:  Limit alcohol intake to no more than 1 drink per day for nonpregnant women and 2 drinks per day for men. One drink equals 12 oz of beer, 5 oz of wine, or 1 oz of hard liquor.  Do not drink on an empty stomach.  Keep yourself hydrated with water, diet soda, or unsweetened iced tea.  Keep in mind that regular soda, juice, and other mixers may contain a lot of sugar and must be counted as carbohydrates.  What are tips for following this plan? Reading food labels  Start by checking the serving size on the label. The amount of calories, carbohydrates, fats, and other nutrients listed on the label are based on one serving of the food. Many foods contain more than one serving per package.  Check the total grams (g) of carbohydrates in one serving. You can calculate the number of servings of carbohydrates in one serving by dividing the total carbohydrates by 15. For example, if a food has 30 g of total carbohydrates, it would be equal to 2 servings of carbohydrates.  Check the number of grams (g) of saturated and trans fats in one serving. Choose foods that have low or no amount of these fats.  Check the number of milligrams (mg) of sodium in one serving. Most people   should limit total sodium intake to less than 2,300 mg per day.  Always check the nutrition information of foods labeled as "low-fat" or "nonfat". These foods may be higher in added sugar or refined carbohydrates and should be avoided.  Talk to your dietitian to identify your daily goals for nutrients listed on the label. Shopping  Avoid buying canned, premade, or processed foods. These  foods tend to be high in fat, sodium, and added sugar.  Shop around the outside edge of the grocery store. This includes fresh fruits and vegetables, bulk grains, fresh meats, and fresh dairy. Cooking  Use low-heat cooking methods, such as baking, instead of high-heat cooking methods like deep frying.  Cook using healthy oils, such as olive, canola, or sunflower oil.  Avoid cooking with butter, cream, or high-fat meats. Meal planning  Eat meals and snacks regularly, preferably at the same times every day. Avoid going long periods of time without eating.  Eat foods high in fiber, such as fresh fruits, vegetables, beans, and whole grains. Talk to your dietitian about how many servings of carbohydrates you can eat at each meal.  Eat 4-6 ounces of lean protein each day, such as lean meat, chicken, fish, eggs, or tofu. 1 ounce is equal to 1 ounce of meat, chicken, or fish, 1 egg, or 1/4 cup of tofu.  Eat some foods each day that contain healthy fats, such as avocado, nuts, seeds, and fish. Lifestyle   Check your blood glucose regularly.  Exercise at least 30 minutes 5 or more days each week, or as told by your health care provider.  Take medicines as told by your health care provider.  Do not use any products that contain nicotine or tobacco, such as cigarettes and e-cigarettes. If you need help quitting, ask your health care provider.  Work with a counselor or diabetes educator to identify strategies to manage stress and any emotional and social challenges. What are some questions to ask my health care provider?  Do I need to meet with a diabetes educator?  Do I need to meet with a dietitian?  What number can I call if I have questions?  When are the best times to check my blood glucose? Where to find more information:  American Diabetes Association: diabetes.org/food-and-fitness/food  Academy of Nutrition and Dietetics:  www.eatright.org/resources/health/diseases-and-conditions/diabetes  National Institute of Diabetes and Digestive and Kidney Diseases (NIH): www.niddk.nih.gov/health-information/diabetes/overview/diet-eating-physical-activity Summary  A healthy meal plan will help you control your blood glucose and maintain a healthy lifestyle.  Working with a diet and nutrition specialist (dietitian) can help you make a meal plan that is best for you.  Keep in mind that carbohydrates and alcohol have immediate effects on your blood glucose levels. It is important to count carbohydrates and to use alcohol carefully. This information is not intended to replace advice given to you by your health care provider. Make sure you discuss any questions you have with your health care provider. Document Released: 04/12/2005 Document Revised: 08/20/2016 Document Reviewed: 08/20/2016 Elsevier Interactive Patient Education  2018 [employer]  

## 2017-08-12 NOTE — Progress Notes (Signed)
Subjective:    Patient ID: Joseph Campbell, male    DOB: 09-Jan-1951, 67 y.o.   MRN: [id number]  HPI  This very nice but poorly compliant 67 yo [name] T2 Diabetic was seen 2 weeks ago with an A1c up from 8.2% to 10.8%  (Ave glu 263 mg%) and was strongly encouraged stricter diet, avoiding  indiscretions and to take his MF & Glipizide as prescribed and return in 2 weeks with a food  & glucose diary. He presents now 3 weeks later w/o the diary and his weight is the same, but alleges his FBG's have improved in the range 15-120 mg%. He denies diabetic polys, or visual blurring.   Medication Sig  . blood glucose meter kit and supplies KIT Dispense based on patient's  insurance preference, [employer] [id number]  . FREESTYLE LITE test strip CHECK GLUCOSE THREE TIMES DAILY  . glipiZIDE (GLUCOTROL) 5 MG tablet TAKE ONE TABLET BY MOUTH THREE TIMES A DAY WITH FOOD  . ketoconazole (NIZORAL) 2 % cream Apply 1 application topically 2 (two) times daily.  . Lancets (FREESTYLE) lancets TEST BLOOD SUGAR THREE TIMES DAILY  . meloxicam (MOBIC) 15 MG tablet Take one daily with food for 2 weeks, can take with tylenol, can not take with aleve, iburpofen, then as needed daily for pain  . metFORMIN (GLUCOPHAGE-XR) 500 MG 24 hr tablet Take 4 tablets (2,000 mg total) by mouth daily.  . methylphenidate (CONCERTA) 36 MG PO CR tablet Take 1 to 2 tablets daily if needed for alertness. Try to not take daily, more effective Should last 12 wks  . olmesartan (BENICAR) 40 MG tablet Take 1 tablet (40 mg total) by mouth daily.  . rosuvastatin (CRESTOR) 20 MG tablet TAKE ONE TABLET BY MOUTH ONCE DAILY FOR CHOLESTEROL  . Vitamin D, Ergocalciferol, (DRISDOL) 50000 units CAPS capsule TAKE 1 CAPSULE BY MOUTH 3 TIMES PER WEEK.  . sildenafil (VIAGRA) 100 MG tablet Take 1 tablet (100 mg total) by mouth as needed for erectile dysfunction.   No facility-administered medications prior to visit.    Allergies  Allergen Reactions  . Adderall  [Amphetamine-Dextroamphet Er]     MOOD CHANGE  . Lipitor [Atorvastatin]     MYALGIA  . Wellbutrin [Bupropion]     HA  . Zoloft [Sertraline Hcl]     AGGITATION   Past Medical History:  Diagnosis Date  . ADD (attention deficit disorder)   . Anxiety   . Depression   . Diabetes mellitus without complication ([place])   . Hyperlipidemia   . Hypertension   . Unspecified vitamin D deficiency    No past surgical history on file.  Review of Systems  10 point systems review negative except as above.    Objective:   Physical Exam  BP 124/82   Pulse 80   Temp (!) 97.3 F (36.3 C)   Resp 16   Ht 5' 7"  (1.702 m)   Wt 234 lb 12.8 oz (106.5 kg)   BMI 36.77 kg/m   HEENT - WNL. Neck - supple.  Chest - Clear equal BS. Cor - Nl HS. RRR w/o sig MGR. PP 1(+). No edema. MS- FROM w/o deformities.  Gait Nl. Neuro -  Nl w/o focal abnormalities.    Assessment & Plan:   1. Poorly controlled type 2 diabetes mellitus ([place])  2. 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 Over 25 minutes of exam, counseling, chart review and complex critical decision making was performed

## 2017-09-28 ENCOUNTER — Other Ambulatory Visit: Payer: Self-pay | Admitting: Physician Assistant

## 2017-10-11 ENCOUNTER — Other Ambulatory Visit: Payer: Self-pay | Admitting: Internal Medicine

## 2017-10-11 DIAGNOSIS — F900 Attention-deficit hyperactivity disorder, predominantly inattentive type: Secondary | ICD-10-CM

## 2017-10-11 MED ORDER — METHYLPHENIDATE HCL ER (OSM) 36 MG PO TBCR: EXTENDED_RELEASE_TABLET | ORAL | 0 refills | Status: DC

## 2017-10-16 DIAGNOSIS — N182 Chronic kidney disease, stage 2 (mild): Secondary | ICD-10-CM | POA: Insufficient documentation

## 2017-10-16 DIAGNOSIS — E1122 Type 2 diabetes mellitus with diabetic chronic kidney disease: Secondary | ICD-10-CM | POA: Insufficient documentation

## 2017-10-16 NOTE — Progress Notes (Signed)
FOLLOW UP  Assessment and Plan:   Hypertension Well controlled with current medications  Monitor blood pressure at home; patient to call if consistently greater than 130/80 Continue DASH diet.   Reminder to go to the ER if any CP, SOB, nausea, dizziness, severe HA, changes vision/speech, left arm numbness and tingling and jaw pain.  Cholesterol Currently above goal on rosuvastatin 20 mg daily  Continue low cholesterol diet and exercise.  Check lipid panel.   Diabetes with diabetic chronic kidney disease Continue medication: metformin, glipizide - now taking more consistently. Home sugars sound much improved - anticipate improved A1C. Continue with diet/exercise.  Continue diet and exercise.  Perform daily foot/skin check, notify office of any concerning changes.  Check A1C  Obesity with co morbidities Long discussion about weight loss, diet, and exercise Recommended diet heavy in fruits and veggies and low in animal meats, cheeses, and dairy products, appropriate calorie intake Discussed ideal weight for height Patient will work on increasing exercise, watching diet - increase vegetables Will follow up in 3 months  Vitamin D Def At goal at last visit; continue supplementation to maintain goal of 70-100 Defer Vit D level  ADD Continue medications Helps with focus, no AE's. The patient was counseled on the addictive nature of the medication and was encouraged to take drug holidays when not needed.    Continue diet and meds as discussed. Further disposition pending results of labs. Discussed med's effects and SE's.   Over 30 minutes of exam, counseling, chart review, and critical decision making was performed.   Future Appointments  Date Time Provider [place]  01/14/2018  2:00 PM Unk Pinto, MD [facility] None    ----------------------------------------------------------------------------------------------------------------------  HPI 67 y.o. male   presents for 3 month follow up on hypertension, cholesterol, diabetes, morbid obesity, ADD and vitamin D deficiency.   Patient is on an ADD medication, he states that the medication is helping and he denies any adverse reactions. He currently takes daily - cannot focus otherwise, helps keep him on track and productive.   BMI is Body mass index is 37.12 kg/m., he has been working on diet and exercise - he has been doing elliptical exercises daily, for 5-10 min at a time. Doing slim fast in the morning.  Wt Readings from Last 3 Encounters:  10/18/17 237 lb (107.5 kg)  08/12/17 234 lb 12.8 oz (106.5 kg)  07/17/17 235 lb (106.6 kg)   His blood pressure has been controlled at home, today their BP is BP: 130/84  He does workout. He denies chest pain, shortness of breath, dizziness.   He is on cholesterol medication (crestor 20 mg daily) and denies myalgias. His cholesterol is not at goal. The cholesterol last visit was:   Lab Results  Component Value Date   CHOL 215 (H) 07/17/2017   HDL 56 07/17/2017   LDLCALC 126 (H) 07/17/2017   TRIG 214 (H) 07/17/2017   CHOLHDL 3.8 07/17/2017    He has been working on diet and exercise for T2 diabetes (on metformin and glipizide 5 mg TID) , and denies foot ulcerations, increased appetite, nausea, paresthesia of the feet, polydipsia, polyuria, visual disturbances, vomiting and weight loss. He does currently check sugars runs 130-140 in the mornings. He has adjusted his glipizide since last time and has been taking consistently since last visit. Last A1C in the office was:  Lab Results  Component Value Date   HGBA1C 10.8 (H) 07/17/2017   Patient is on Vitamin D supplement and at  goal at recent check:    Lab Results  Component Value Date   VD25OH 68 07/17/2017        Current Medications:  Current Outpatient Medications on File Prior to Visit  Medication Sig  . blood glucose meter kit and supplies KIT Dispense based on patient's  insurance preference,  [employer] Elite-E11.22  . FREESTYLE LITE test strip CHECK GLUCOSE THREE TIMES DAILY  . glipiZIDE (GLUCOTROL) 5 MG tablet TAKE ONE TABLET BY MOUTH THREE TIMES A DAY WITH FOOD  . ketoconazole (NIZORAL) 2 % cream Apply 1 application topically 2 (two) times daily.  . Lancets (FREESTYLE) lancets TEST BLOOD SUGAR THREE TIMES DAILY  . meloxicam (MOBIC) 15 MG tablet Take one daily with food for 2 weeks, can take with tylenol, can not take with aleve, iburpofen, then as needed daily for pain  . metFORMIN (GLUCOPHAGE-XR) 500 MG 24 hr tablet Take 4 tablets (2,000 mg total) by mouth daily.  . methylphenidate (CONCERTA) 36 MG PO CR tablet Take 1 to 2 tablets daily if needed for alertness Try to not take daily, more effective Should last 12 wks til June 7th  . olmesartan (BENICAR) 40 MG tablet Take 1 tablet (40 mg total) by mouth daily.  . rosuvastatin (CRESTOR) 20 MG tablet TAKE ONE TABLET BY MOUTH ONCE DAILY FOR CHOLESTEROL  . Vitamin D, Ergocalciferol, (DRISDOL) 50000 units CAPS capsule TAKE 1 CAPSULE BY MOUTH 3 TIMES EVERY WEEK  . sildenafil (VIAGRA) 100 MG tablet Take 1 tablet (100 mg total) by mouth as needed for erectile dysfunction.   No current facility-administered medications on file prior to visit.      Allergies:  Allergies  Allergen Reactions  . Adderall [Amphetamine-Dextroamphet Er]     MOOD CHANGE  . Lipitor [Atorvastatin]     MYALGIA  . Wellbutrin [Bupropion]     HA  . Zoloft [Sertraline Hcl]     AGGITATION     Medical History:  Past Medical History:  Diagnosis Date  . ADD (attention deficit disorder)   . Anxiety   . Depression   . Diabetes mellitus without complication ([place])   . Hyperlipidemia   . Hypertension   . Unspecified vitamin D deficiency    Family history- Reviewed and unchanged Social history- Reviewed and unchanged   Review of Systems:  Review of Systems  Constitutional: Negative for malaise/fatigue and weight loss.  HENT: Negative for hearing loss and  tinnitus.   Eyes: Negative for blurred vision and double vision.  Respiratory: Negative for cough, shortness of breath and wheezing.   Cardiovascular: Negative for chest pain, palpitations, orthopnea, claudication and leg swelling.  Gastrointestinal: Negative for abdominal pain, blood in stool, constipation, diarrhea, heartburn, melena, nausea and vomiting.  Genitourinary: Negative.   Musculoskeletal: Negative for joint pain and myalgias.  Skin: Negative for rash.  Neurological: Negative for dizziness, tingling, sensory change, weakness and headaches.  Endo/Heme/Allergies: Negative for polydipsia.  Psychiatric/Behavioral: Negative.   All other systems reviewed and are negative.   Physical Exam: BP 130/84   Pulse 62   Temp 97.9 F (36.6 C)   Ht 5' 7"  (1.702 m)   Wt 237 lb (107.5 kg)   SpO2 97%   BMI 37.12 kg/m  Wt Readings from Last 3 Encounters:  10/18/17 237 lb (107.5 kg)  08/12/17 234 lb 12.8 oz (106.5 kg)  07/17/17 235 lb (106.6 kg)   General Appearance: Well nourished, in no apparent distress. Eyes: PERRLA, EOMs, conjunctiva no swelling or erythema Sinuses: No Frontal/maxillary tenderness ENT/Mouth:  Ext aud canals clear, TMs without erythema, bulging. No erythema, swelling, or exudate on post pharynx.  Tonsils not swollen or erythematous. Hearing normal.  Neck: Supple, thyroid normal.  Respiratory: Respiratory effort normal, BS equal bilaterally without rales, rhonchi, wheezing or stridor.  Cardio: RRR with no MRGs. Brisk peripheral pulses without edema.  Abdomen: Soft, + BS.  Non tender, no guarding, rebound, hernias, masses. Lymphatics: Non tender without lymphadenopathy.  Musculoskeletal: Full ROM, 5/5 strength, Normal gait Skin: Warm, dry without rashes, lesions, ecchymosis.  Neuro: Cranial nerves intact. No cerebellar symptoms.  Psych: Awake and oriented X 3, normal affect, Insight and Judgment appropriate.    Izora Ribas, NP 8:45 AM [facility]

## 2017-10-18 ENCOUNTER — Encounter: Payer: Self-pay | Admitting: Adult Health

## 2017-10-18 ENCOUNTER — Ambulatory Visit: Payer: BLUE CROSS/BLUE SHIELD | Admitting: Adult Health

## 2017-10-18 ENCOUNTER — Ambulatory Visit: Payer: Self-pay | Admitting: Adult Health

## 2017-10-18 VITALS — BP 130/84 | HR 62 | Temp 97.9°F | Ht 67.0 in | Wt 237.0 lb

## 2017-10-18 DIAGNOSIS — F900 Attention-deficit hyperactivity disorder, predominantly inattentive type: Secondary | ICD-10-CM

## 2017-10-18 DIAGNOSIS — E782 Mixed hyperlipidemia: Secondary | ICD-10-CM

## 2017-10-18 DIAGNOSIS — I10 Essential (primary) hypertension: Secondary | ICD-10-CM

## 2017-10-18 DIAGNOSIS — E1122 Type 2 diabetes mellitus with diabetic chronic kidney disease: Secondary | ICD-10-CM

## 2017-10-18 DIAGNOSIS — E559 Vitamin D deficiency, unspecified: Secondary | ICD-10-CM

## 2017-10-18 DIAGNOSIS — Z79899 Other long term (current) drug therapy: Secondary | ICD-10-CM

## 2017-10-18 DIAGNOSIS — N182 Chronic kidney disease, stage 2 (mild): Secondary | ICD-10-CM | POA: Diagnosis not present

## 2017-10-18 NOTE — Patient Instructions (Addendum)
Aim for 7+ servings of fruits and vegetables daily  80+ fluid ounces of water or unsweet tea for healthy kidneys  Limit alcohol intake - less is always better  Limit animal fats in diet for cholesterol and heart health - choose grass fed whenever available  Aim for low stress - take time to unwind and care for your mental health  Aim for 150 min of moderate intensity exercise weekly for heart health, and weights twice weekly for bone health  Aim for 7-9 hours of sleep daily       When it comes to diets, agreement about the perfect plan isn't easy to find, even among the experts. Experts at the [employer] developed an idea known as the Healthy Eating Plate. Just imagine a plate divided into logical, healthy portions.  The emphasis is on diet quality:  Load up on vegetables and fruits - one-half of your plate: Aim for color and variety, and remember that potatoes don't count.  Go for whole grains - one-quarter of your plate: Whole wheat, barley, wheat berries, quinoa, oats, brown rice, and foods made with them. If you want pasta, go with whole wheat pasta.  Protein power - one-quarter of your plate: Fish, chicken, beans, and nuts are all healthy, versatile protein sources. Limit red meat.  The diet, however, does go beyond the plate, offering a few other suggestions.  Use healthy plant oils, such as olive, canola, soy, corn, sunflower and peanut. Check the labels, and avoid partially hydrogenated oil, which have unhealthy trans fats.  If you're thirsty, drink water. Coffee and tea are good in moderation, but skip sugary drinks and limit milk and dairy products to one or two daily servings.  The type of carbohydrate in the diet is more important than the amount. Some sources of carbohydrates, such as vegetables, fruits, whole grains, and beans-are healthier than others.  Finally, stay active.    Targets for Glucose Readings: Time of Check Usual Target for  Most People  Before Meals  70-130  Two hours after meals  Less than 180  Bedtime  90-150   Why Should You Check Your Blood Glucose? -The A1C tells you how your diabetes is doing over a 3 month period.  Home blood glucose monitoring (or checking) gives you information about your diabetes on a daily basis.  You will learn how well your diabetes care plan is working and whether your blood glucose is in your target range throughout the day.   -Reviewing daily blood glucose levels will help you and your healthcare team make any needed changes to you meal plan, physical activity and medications.  Meter Supplies: - A glucose meter was provided at your visit today along with strips and lancets. The blood glucose meter strips and lancets are usually covered by health insurance. Please let us know if they are not and contact your insurance to see which meter they prefer.  How to get a good blood sample: -Wash your hands in warm water.  You do not need to use alcohol wipes. -Massage your hands. -Choose which finger you will use.  It helps to use a different finger each time to avoid soreness. -Keep you hand below your wrist when using you lancet to "prick" your finger. -Apply gentle pressure, but do NOT squeeze your finger.  Checking your blood glucose Important Reminders: -Make sure your strips are not expired.  Check the date on the bottle. -Make sure the code on bottle matches the  code on your machine. -Make sure your hands are clean and dry. -Do not use the center of your finger, it is the most sensitive area.  Use a spot to the side of the center of your fingertip. -Completely fill the strip target area with blood (until it beeps) to make sure the results are accurate. -You will need a prescription to have your glucose strips and lancets covered by insurance. -There is usually an 800 number on the back of your meter for help with meter issues.  How often to check: -If you take diabetes pills  or take one injection of insulin each day, you will usually be asked to check twice a day, before breakfast and 2 hours after one meal, or as directed. -If you take several insulin injections each day, you will usually be asked to check four times a day before meals and at bedtime every day.

## 2017-10-19 LAB — CBC WITH DIFFERENTIAL/PLATELET
BASOS PCT: 0.5 %
Basophils Absolute: 40 cells/uL (ref 0–200)
Eosinophils Absolute: 160 cells/uL (ref 15–500)
Eosinophils Relative: 2 %
HEMATOCRIT: 42.4 % (ref 38.5–50.0)
Hemoglobin: 14.9 g/dL (ref 13.2–17.1)
LYMPHS ABS: 2056 {cells}/uL (ref 850–3900)
MCH: 31.3 pg (ref 27.0–33.0)
MCHC: 35.1 g/dL (ref 32.0–36.0)
MCV: 89.1 fL (ref 80.0–100.0)
MPV: 9.7 fL (ref 7.5–12.5)
Monocytes Relative: 9.1 %
Neutro Abs: 5016 cells/uL (ref 1500–7800)
Neutrophils Relative %: 62.7 %
Platelets: 316 10*3/uL (ref 140–400)
RBC: 4.76 10*6/uL (ref 4.20–5.80)
RDW: 12.2 % (ref 11.0–15.0)
Total Lymphocyte: 25.7 %
WBC: 8 10*3/uL (ref 3.8–10.8)
WBCMIX: 728 {cells}/uL (ref 200–950)

## 2017-10-19 LAB — HEMOGLOBIN A1C
HEMOGLOBIN A1C: 7.6 %{Hb} — AB (ref <5.7)
Mean Plasma Glucose: 171 (calc)
eAG (mmol/L): 9.5 (calc)

## 2017-10-19 LAB — HEPATIC FUNCTION PANEL
AG Ratio: 1.8 (calc) (ref 1.0–2.5)
ALBUMIN MSPROF: 4.4 g/dL (ref 3.6–5.1)
ALT: 17 U/L (ref 9–46)
AST: 13 U/L (ref 10–35)
Alkaline phosphatase (APISO): 63 U/L (ref 40–115)
BILIRUBIN DIRECT: 0.1 mg/dL (ref 0.0–0.2)
BILIRUBIN TOTAL: 0.4 mg/dL (ref 0.2–1.2)
GLOBULIN: 2.5 g/dL (ref 1.9–3.7)
Indirect Bilirubin: 0.3 mg/dL (calc) (ref 0.2–1.2)
Total Protein: 6.9 g/dL (ref 6.1–8.1)

## 2017-10-19 LAB — BASIC METABOLIC PANEL WITH GFR
BUN: 20 mg/dL (ref 7–25)
CALCIUM: 9.4 mg/dL (ref 8.6–10.3)
CHLORIDE: 101 mmol/L (ref 98–110)
CO2: 25 mmol/L (ref 20–32)
Creat: 1.07 mg/dL (ref 0.70–1.25)
GFR, EST AFRICAN AMERICAN: 82 mL/min/{1.73_m2} (ref >=60)
GFR, EST NON AFRICAN AMERICAN: 70 mL/min/{1.73_m2} (ref >=60)
Glucose, Bld: 143 mg/dL — ABNORMAL HIGH (ref 65–99)
Potassium: 4.4 mmol/L (ref 3.5–5.3)
Sodium: 135 mmol/L (ref 135–146)

## 2017-10-19 LAB — LIPID PANEL
CHOL/HDL RATIO: 2.5 (calc) (ref <5.0)
CHOLESTEROL: 147 mg/dL (ref <200)
HDL: 59 mg/dL (ref >40)
LDL CHOLESTEROL (CALC): 67 mg/dL
Non-HDL Cholesterol (Calc): 88 mg/dL (calc) (ref <130)
TRIGLYCERIDES: 119 mg/dL (ref <150)

## 2017-10-19 LAB — TSH: TSH: 3.01 mIU/L (ref 0.40–4.50)

## 2017-12-23 ENCOUNTER — Other Ambulatory Visit: Payer: Self-pay | Admitting: Internal Medicine

## 2018-01-03 ENCOUNTER — Other Ambulatory Visit: Payer: Self-pay | Admitting: Internal Medicine

## 2018-01-03 DIAGNOSIS — F900 Attention-deficit hyperactivity disorder, predominantly inattentive type: Secondary | ICD-10-CM

## 2018-01-03 MED ORDER — METHYLPHENIDATE HCL ER (OSM) 36 MG PO TBCR: EXTENDED_RELEASE_TABLET | ORAL | 0 refills | Status: DC

## 2018-01-14 ENCOUNTER — Ambulatory Visit: Payer: BLUE CROSS/BLUE SHIELD | Admitting: Internal Medicine

## 2018-01-14 ENCOUNTER — Other Ambulatory Visit: Payer: Self-pay | Admitting: *Deleted

## 2018-01-14 VITALS — BP 130/84 | HR 88 | Temp 97.1°F | Resp 16 | Ht 67.25 in | Wt 231.2 lb

## 2018-01-14 DIAGNOSIS — Z1211 Encounter for screening for malignant neoplasm of colon: Secondary | ICD-10-CM

## 2018-01-14 DIAGNOSIS — E559 Vitamin D deficiency, unspecified: Secondary | ICD-10-CM

## 2018-01-14 DIAGNOSIS — Z0001 Encounter for general adult medical examination with abnormal findings: Secondary | ICD-10-CM | POA: Diagnosis not present

## 2018-01-14 DIAGNOSIS — N182 Chronic kidney disease, stage 2 (mild): Secondary | ICD-10-CM

## 2018-01-14 DIAGNOSIS — I10 Essential (primary) hypertension: Secondary | ICD-10-CM | POA: Diagnosis not present

## 2018-01-14 DIAGNOSIS — Z79899 Other long term (current) drug therapy: Secondary | ICD-10-CM | POA: Diagnosis not present

## 2018-01-14 DIAGNOSIS — Z125 Encounter for screening for malignant neoplasm of prostate: Secondary | ICD-10-CM | POA: Diagnosis not present

## 2018-01-14 DIAGNOSIS — R5383 Other fatigue: Secondary | ICD-10-CM

## 2018-01-14 DIAGNOSIS — E782 Mixed hyperlipidemia: Secondary | ICD-10-CM

## 2018-01-14 DIAGNOSIS — Z87891 Personal history of nicotine dependence: Secondary | ICD-10-CM

## 2018-01-14 DIAGNOSIS — N138 Other obstructive and reflux uropathy: Secondary | ICD-10-CM | POA: Diagnosis not present

## 2018-01-14 DIAGNOSIS — N401 Enlarged prostate with lower urinary tract symptoms: Secondary | ICD-10-CM

## 2018-01-14 DIAGNOSIS — Z136 Encounter for screening for cardiovascular disorders: Secondary | ICD-10-CM | POA: Diagnosis not present

## 2018-01-14 DIAGNOSIS — E1122 Type 2 diabetes mellitus with diabetic chronic kidney disease: Secondary | ICD-10-CM

## 2018-01-14 DIAGNOSIS — Z1212 Encounter for screening for malignant neoplasm of rectum: Secondary | ICD-10-CM

## 2018-01-14 DIAGNOSIS — Z8249 Family history of ischemic heart disease and other diseases of the circulatory system: Secondary | ICD-10-CM

## 2018-01-14 MED ORDER — GLIPIZIDE 5 MG PO TABS: ORAL_TABLET | ORAL | 1 refills | Status: DC

## 2018-01-14 MED ORDER — VITAMIN D (ERGOCALCIFEROL) 1.25 MG (50000 UNIT) PO CAPS: ORAL_CAPSULE | ORAL | 1 refills | Status: DC

## 2018-01-14 NOTE — Progress Notes (Signed)
[facility]   Unk Pinto, M.D.     [name]. [name], P.A.-C [name], [place]                [address] [place], [place] [address] Telephone [phone] Telefax [phone] Annual  Screening/Preventative Visit  & Comprehensive Evaluation & Examination     This very nice 67 y.o. [name] presents for a Screening/Preventative Visit & comprehensive evaluation and management of multiple medical co-morbidities.  Patient has been followed for HTN, HLD, T2_NIDDM  and Vitamin D Deficiency. 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      HTN predates circa 1990's. Patient's BP has been controlled at home.  Today's BP is at goal - 130/84. Patient denies any cardiac symptoms as chest pain, palpitations, shortness of breath, dizziness or ankle swelling.     Patient's hyperlipidemia is controlled with diet and medications. Patient denies myalgias or other medication SE's. Last lipids were  Lab Results  Component Value Date   CHOL 147 10/18/2017   HDL 59 10/18/2017   LDLCALC 67 10/18/2017   TRIG 119 10/18/2017   CHOLHDL 2.5 10/18/2017      Patient has Morbid Obesity (BMI 36) and T2_NIDDM (2010) and patient denies reactive hypoglycemic symptoms, visual blurring, diabetic polys or paresthesias. Last A1c was not at goal: Lab Results  Component Value Date   HGBA1C 7.6 (H) 10/18/2017       Finally, patient has history of Vitamin D Deficiency ("26"/2008)  and last vitamin D was at goal: Lab Results  Component Value Date   VD25OH 75 07/17/2017   Current Outpatient Medications on File Prior to Visit  Medication Sig  . blood glucose meter kit and supplies KIT Dispense based on patient's  insurance preference, [employer] Elite-E11.22  . FREESTYLE LITE test strip CHECK GLUCOSE THREE TIMES DAILY  .  ketoconazole (NIZORAL) 2 % cream Apply 1 application topically 2 (two) times daily.  . Lancets (FREESTYLE) lancets TEST BLOOD SUGAR THREE TIMES DAILY  . meloxicam (MOBIC) 15 MG tablet Take one daily with food for 2 weeks, can take with tylenol, can not take with aleve, iburpofen, then as needed daily for pain  . metFORMIN (GLUCOPHAGE-XR) 500 MG 24 hr tablet TAKE 4 TABLETS(2000 MG) BY MOUTH DAILY  . methylphenidate (CONCERTA) 36 MG PO CR tablet Take 1 to 2 tablets daily if needed for alertness. Try to not take daily, more effective. Should last 12 wks til August 30th  . olmesartan (BENICAR) 40 MG tablet TAKE 1 TABLET(40 MG) BY MOUTH DAILY  . rosuvastatin (CRESTOR) 20 MG tablet TAKE 1 TABLET BY MOUTH EVERY DAY FOR CHOLESTEROL  . sildenafil (VIAGRA) 100 MG tablet Take 1 tablet (100 mg total) by mouth as needed for erectile dysfunction.   No current facility-administered medications on file prior to visit.    Allergies  Allergen Reactions  . Adderall [Amphetamine-Dextroamphet Er]     MOOD CHANGE  . Lipitor [Atorvastatin]     MYALGIA  . Wellbutrin [Bupropion]     HA  . Zoloft [Sertraline Hcl]     AGGITATION   Past Medical History:  Diagnosis Date  . ADD (attention deficit disorder)   . Anxiety   . Depression   . Diabetes mellitus without complication ([place])   . Hyperlipidemia   .  Hypertension   . Unspecified vitamin D deficiency    Health Maintenance  Topic Date Due  . Hepatitis C Screening  08/27/1950  . OPHTHALMOLOGY EXAM  06/13/2013  . FOOT EXAM  12/20/2017  . PNA vac Low Risk Adult (1 of 2 - PCV13) 01/15/2019 (Originally 09/26/2013)  . INFLUENZA VACCINE  05/17/2019 (Originally 02/27/2018)  . HEMOGLOBIN A1C  04/20/2018  . COLONOSCOPY  06/12/2021  . TETANUS/TDAP  09/16/2024   Immunization History  Administered Date(s) Administered  . DT 09/16/2014  . PPD Test 09/16/2014, 10/28/2015  . Pneumococcal-Unspecified 07/31/1999  . Td 07/31/2003  . Zoster 05/26/2013   Last Colon -  06/13/2011 - Dr Henrene Pastor recc 10 yr f/u due Nov 2022  No past surgical history on file.   Family History  Problem Relation Age of Onset  . Hypertension Mother   . Cancer Mother        BREAST  . Hypertension Father   . Hypertension Sister   . Hyperlipidemia Sister    Social History   Socioeconomic History  . Marital status: Divorced  Occupational History  . Driver of a fuel oil truck  Tobacco Use  . Smoking status: Former Smoker    Last attempt to quit: 09/30/1963    Years since quitting: 54.3  . Smokeless tobacco: Current User    Types: Chew  . Tobacco comment: Cigars  Substance and Sexual Activity  . Alcohol use: No  . Drug use: No  . Sexual activity: No    ROS Constitutional: Denies fever, chills, weight loss/gain, headaches, insomnia,  night sweats or change in appetite. Does c/o fatigue. Eyes: Denies redness, blurred vision, diplopia, discharge, itchy or watery eyes.  ENT: Denies discharge, congestion, post nasal drip, epistaxis, sore throat, earache, hearing loss, dental pain, Tinnitus, Vertigo, Sinus pain or snoring.  Cardio: Denies chest pain, palpitations, irregular heartbeat, syncope, dyspnea, diaphoresis, orthopnea, PND, claudication or edema Respiratory: denies cough, dyspnea, DOE, pleurisy, hoarseness, laryngitis or wheezing.  Gastrointestinal: Denies dysphagia, heartburn, reflux, water brash, pain, cramps, nausea, vomiting, bloating, diarrhea, constipation, hematemesis, melena, hematochezia, jaundice or hemorrhoids Genitourinary: Denies dysuria, frequency, urgency, nocturia, hesitancy, discharge, hematuria or flank pain Musculoskeletal: Denies arthralgia, myalgia, stiffness, Jt. Swelling, pain, limp or strain/sprain. Denies Falls. Skin: Denies puritis, rash, hives, warts, acne, eczema or change in skin lesion Neuro: No weakness, tremor, incoordination, spasms, paresthesia or pain Psychiatric: Denies confusion, memory loss or sensory loss. Denies  Depression. Endocrine: Denies change in weight, skin, hair change, nocturia, and paresthesia, diabetic polys, visual blurring or hyper / hypo glycemic episodes.  Heme/Lymph: No excessive bleeding, bruising or enlarged lymph nodes.  Physical Exam  BP 130/84   Pulse 88   Temp (!) 97.1 F (36.2 C)   Resp 16   Ht 5' 7.25" (1.708 m)   Wt 231 lb 3.2 oz (104.9 kg)   BMI 35.94 kg/m   General Appearance: Well nourished and well groomed and in no apparent distress.  Eyes: PERRLA, EOMs, conjunctiva no swelling or erythema, normal fundi and vessels. Sinuses: No frontal/maxillary tenderness ENT/Mouth: EACs patent / TMs  nl. Nares clear without erythema, swelling, mucoid exudates. Oral hygiene is good. No erythema, swelling, or exudate. Tongue normal, non-obstructing. Tonsils not swollen or erythematous. Hearing normal.  Neck: Supple, thyroid not palpable. No bruits, nodes or JVD. Respiratory: Respiratory effort normal.  BS equal and clear bilateral without rales, rhonci, wheezing or stridor. Cardio: Heart sounds are normal with regular rate and rhythm and no murmurs, rubs or gallops. Peripheral pulses are normal  and equal bilaterally without edema. No aortic or femoral bruits. Chest: symmetric with normal excursions and percussion.  Abdomen: Soft, with Nl bowel sounds. Nontender, no guarding, rebound, hernias, masses, or organomegaly.  Lymphatics: Non tender without lymphadenopathy.  Genitourinary: No hernias.Testes nl. DRE - prostate nl for age - smooth & firm w/o nodules. Musculoskeletal: Full ROM all peripheral extremities, joint stability, 5/5 strength, and normal gait. Skin: Warm and dry without rashes, lesions, cyanosis, clubbing or  ecchymosis.  Neuro: Cranial nerves intact, reflexes equal bilaterally. Normal muscle tone, no cerebellar symptoms. Sensation intact to touch, vibratory and Monofilament to the toes bilaterally. Pysch: Alert and oriented X 3 with normal affect, insight and  judgment appropriate.   Assessment and Plan  1. Annual Preventative/Screening Exam   2. Essential hypertension  - EKG 12-Lead - [place], RETROPERITNL ABD,  LTD - CBC with Differential/Platelet - COMPLETE METABOLIC PANEL WITH GFR - Magnesium - TSH  3. Hyperlipidemia, mixed  - EKG 12-Lead - [place], RETROPERITNL ABD,  LTD - Lipid panel - TSH  4. Type 2 diabetes mellitus with stage 2 chronic kidney disease, without long-term current use of insulin (HCC)  - EKG 12-Lead - [place], RETROPERITNL ABD,  LTD - Urinalysis, Routine w reflex microscopic - Microalbumin / creatinine urine ratio - HM DIABETES FOOT EXAM - LOW EXTREMITY NEUR EXAM DOCUM - Hemoglobin A1c - Insulin, random  5. Vitamin D deficiency  - VITAMIN D 25 Hydroxyl  6. Screening for ischemic heart disease  - EKG 12-Lead - Lipid panel  7. Screening for AAA (aortic abdominal aneurysm)  - [place], RETROPERITNL ABD,  LTD  8. Screening for colorectal cancer  - POC Hemoccult Bld/Stl   9. Prostate cancer screening  - PSA  10. BPH with obstruction/lower urinary tract symptoms  - PSA  11. Fatigue, unspecified type  - Iron,Total/Total Iron Binding Cap - Vitamin B12 - CBC with Differential/Platelet  12. Medication management  - Urinalysis, Routine w reflex microscopic - Microalbumin / creatinine urine ratio - CBC with Differential/Platelet - COMPLETE METABOLIC PANEL WITH GFR - Magnesium - Lipid panel - TSH - Hemoglobin A1c - Insulin, random - VITAMIN D 25 Hydroxyl       Patient was counseled in prudent diet, weight control to achieve/maintain BMI less than 25, BP monitoring, regular exercise and medications as discussed.  Discussed med effects and SE's. Routine screening labs and tests as requested with regular follow-up as recommended. Over 40 minutes of exam, counseling, chart review and high complex critical decision making was performed

## 2018-01-14 NOTE — Patient Instructions (Signed)

## 2018-01-19 ENCOUNTER — Encounter: Payer: Self-pay | Admitting: Internal Medicine

## 2018-03-23 ENCOUNTER — Other Ambulatory Visit: Payer: Self-pay | Admitting: Internal Medicine

## 2018-03-26 ENCOUNTER — Other Ambulatory Visit: Payer: Self-pay | Admitting: Internal Medicine

## 2018-03-26 DIAGNOSIS — F900 Attention-deficit hyperactivity disorder, predominantly inattentive type: Secondary | ICD-10-CM

## 2018-03-26 MED ORDER — METHYLPHENIDATE HCL ER (OSM) 36 MG PO TBCR: EXTENDED_RELEASE_TABLET | ORAL | 0 refills | Status: DC

## 2018-04-24 NOTE — Progress Notes (Signed)
FOLLOW UP  Assessment and Plan:   Hypertension Well controlled with current medications  Monitor blood pressure at home; patient to call if consistently greater than 130/80 Continue DASH diet.   Reminder to go to the ER if any CP, SOB, nausea, dizziness, severe HA, changes vision/speech, left arm numbness and tingling and jaw pain.  Cholesterol Currently above goal; increase to rosuvastatin 20 mg daily  Continue low cholesterol diet and exercise.  Check lipid panel.   Diabetes with diabetic chronic kidney disease Continue medication: metformin, glipizide - Continue with diet/exercise.  Continue diet and exercise.  Perform daily foot/skin check, notify office of any concerning changes.  Check A1C  Obesity with co morbidities Long discussion about weight loss, diet, and exercise Recommended diet heavy in fruits and veggies and low in animal meats, cheeses, and dairy products, appropriate calorie intake Discussed ideal weight for height Patient will work on increasing exercise, watching diet - increase vegetables Will follow up in 3 months  Vitamin D Def At goal at last visit; continue supplementation to maintain goal of 70-100 Defer Vit D level  ADD Continue medications Helps with focus, no AE's. The patient was counseled on the addictive nature of the medication and was encouraged to take drug holidays when not needed.    Continue diet and meds as discussed. Further disposition pending results of labs. Discussed med's effects and SE's.   Over 30 minutes of exam, counseling, chart review, and critical decision making was performed.   Future Appointments  Date Time Provider Marina  07/31/2018  9:30 AM Unk Pinto, MD [facility] None  02/12/2019  2:00 PM Unk Pinto, MD [facility] None    ----------------------------------------------------------------------------------------------------------------------  HPI 67 y.o. male  presents for 3 month  follow up on hypertension, cholesterol, diabetes, morbid obesity, ADD and vitamin D deficiency.   Patient is on an ADD medication (concerta 36 mg x 2 tabs) he states that the medication is helping and he denies any adverse reactions. He currently takes daily - cannot focus otherwise, helps keep him on track and productive. He tries to   BMI is Body mass index is 35.69 kg/m., he has been working on diet and exercise - he has been doing elliptical exercises daily, for 5-10 min at a time daily. Doing slim fast in the morning and sometimes during the day. Eats packs of tuna with veggies during the day as well.  Wt Readings from Last 3 Encounters:  04/30/18 229 lb 9.6 oz (104.1 kg)  01/14/18 231 lb 3.2 oz (104.9 kg)  10/18/17 237 lb (107.5 kg)   His blood pressure has been controlled at home, today their BP is BP: 110/70  He does workout. He denies chest pain, shortness of breath, dizziness.   He is on cholesterol medication (crestor 10 mg daily) and denies myalgias. His cholesterol is not at goal. The cholesterol last visit was:   Lab Results  Component Value Date   CHOL 167 01/14/2018   HDL 55 01/14/2018   LDLCALC 86 01/14/2018   TRIG 158 (H) 01/14/2018   CHOLHDL 3.0 01/14/2018    He has been working on diet and exercise for T2 diabetes (on metformin and glipizide 5 mg BID) , and denies foot ulcerations, increased appetite, nausea, paresthesia of the feet, polydipsia, polyuria, visual disturbances, vomiting and weight loss. He does currently check sugars runs 130-140 in the mornings. Last A1C in the office was:  Lab Results  Component Value Date   HGBA1C 6.9 (H) 01/14/2018  Patient is on Vitamin D supplement and at goal at recent check:    Lab Results  Component Value Date   VD25OH 71 01/14/2018        Current Medications:  Current Outpatient Medications on File Prior to Visit  Medication Sig  . blood glucose meter kit and supplies KIT Dispense based on patient's  insurance  preference, [employer] Elite-E11.22  . FREESTYLE LITE test strip CHECK GLUCOSE THREE TIMES DAILY  . glipiZIDE (GLUCOTROL) 5 MG tablet TAKE ONE TABLET BY MOUTH THREE TIMES A DAY WITH FOOD  . ketoconazole (NIZORAL) 2 % cream Apply 1 application topically 2 (two) times daily.  . Lancets (FREESTYLE) lancets TEST BLOOD SUGAR THREE TIMES DAILY  . meloxicam (MOBIC) 15 MG tablet Take one daily with food for 2 weeks, can take with tylenol, can not take with aleve, iburpofen, then as needed daily for pain  . metFORMIN (GLUCOPHAGE-XR) 500 MG 24 hr tablet TAKE 4 TABLETS(2000 MG) BY MOUTH DAILY  . methylphenidate (CONCERTA) 36 MG PO CR tablet Take 1 to 2 tablets daily if needed for alertness. Try to not take daily, more effective. Should last 12 wks til August 30th  . olmesartan (BENICAR) 40 MG tablet TAKE 1 TABLET(40 MG) BY MOUTH DAILY  . rosuvastatin (CRESTOR) 20 MG tablet TAKE 1 TABLET BY MOUTH EVERY DAY FOR CHOLESTEROL  . sildenafil (VIAGRA) 100 MG tablet Take 1 tablet (100 mg total) by mouth as needed for erectile dysfunction.  . Vitamin D, Ergocalciferol, (DRISDOL) 50000 units CAPS capsule TAKE 1 CAPSULE BY MOUTH 3 TIMES EVERY WEEK   No current facility-administered medications on file prior to visit.      Allergies:  Allergies  Allergen Reactions  . Adderall [Amphetamine-Dextroamphet Er]     MOOD CHANGE  . Lipitor [Atorvastatin]     MYALGIA  . Wellbutrin [Bupropion]     HA  . Zoloft [Sertraline Hcl]     AGGITATION     Medical History:  Past Medical History:  Diagnosis Date  . ADD (attention deficit disorder)   . Anxiety   . Depression   . Diabetes mellitus without complication ([place])   . Hyperlipidemia   . Hypertension   . Unspecified vitamin D deficiency    Family history- Reviewed and unchanged Social history- Reviewed and unchanged   Review of Systems:  Review of Systems  Constitutional: Negative for malaise/fatigue and weight loss.  HENT: Negative for hearing loss and  tinnitus.   Eyes: Negative for blurred vision and double vision.  Respiratory: Negative for cough, shortness of breath and wheezing.   Cardiovascular: Negative for chest pain, palpitations, orthopnea, claudication and leg swelling.  Gastrointestinal: Negative for abdominal pain, blood in stool, constipation, diarrhea, heartburn, melena, nausea and vomiting.  Genitourinary: Negative.   Musculoskeletal: Negative for joint pain and myalgias.  Skin: Negative for rash.  Neurological: Negative for dizziness, tingling, sensory change, weakness and headaches.  Endo/Heme/Allergies: Negative for polydipsia.  Psychiatric/Behavioral: Negative.   All other systems reviewed and are negative.   Physical Exam: BP 110/70   Pulse 90   Temp (!) 97.4 F (36.3 C)   Ht 5' 7.25" (1.708 m)   Wt 229 lb 9.6 oz (104.1 kg)   SpO2 96%   BMI 35.69 kg/m  Wt Readings from Last 3 Encounters:  04/30/18 229 lb 9.6 oz (104.1 kg)  01/14/18 231 lb 3.2 oz (104.9 kg)  10/18/17 237 lb (107.5 kg)   General Appearance: Well nourished, in no apparent distress. Eyes: PERRLA, EOMs, conjunctiva  no swelling or erythema Sinuses: No Frontal/maxillary tenderness ENT/Mouth: Ext aud canals clear, TMs without erythema, bulging. No erythema, swelling, or exudate on post pharynx.  Tonsils not swollen or erythematous. Hearing normal.  Neck: Supple, thyroid normal.  Respiratory: Respiratory effort normal, BS equal bilaterally without rales, rhonchi, wheezing or stridor.  Cardio: RRR with no MRGs. Brisk peripheral pulses without edema.  Abdomen: Soft, + BS.  Non tender, no guarding, rebound, hernias, masses. Lymphatics: Non tender without lymphadenopathy.  Musculoskeletal: Full ROM, 5/5 strength, Normal gait Skin: Warm, dry without rashes, lesions, ecchymosis.  Neuro: Cranial nerves intact. No cerebellar symptoms.  Psych: Awake and oriented X 3, normal affect, Insight and Judgment appropriate.    Izora Ribas, NP 8:45  AM [facility]

## 2018-04-29 LAB — MAGNESIUM: Magnesium: 1.9 mg/dL (ref 1.5–2.5)

## 2018-04-29 LAB — TSH: TSH: 3.57 mIU/L (ref 0.40–4.50)

## 2018-04-29 LAB — MICROALBUMIN / CREATININE URINE RATIO
CREATININE, URINE: 71 mg/dL (ref 20–320)
MICROALB UR: 1.3 mg/dL
Microalb Creat Ratio: 18 mcg/mg creat (ref <30)

## 2018-04-29 LAB — LIPID PANEL
CHOL/HDL RATIO: 3 (calc) (ref <5.0)
CHOLESTEROL: 167 mg/dL (ref <200)
HDL: 55 mg/dL (ref >40)
LDL CHOLESTEROL (CALC): 86 mg/dL
Non-HDL Cholesterol (Calc): 112 mg/dL (calc) (ref <130)
Triglycerides: 158 mg/dL — ABNORMAL HIGH (ref <150)

## 2018-04-29 LAB — HEMOGLOBIN A1C
Hgb A1c MFr Bld: 6.9 % of total Hgb — ABNORMAL HIGH (ref <5.7)
Mean Plasma Glucose: 151 (calc)
eAG (mmol/L): 8.4 (calc)

## 2018-04-29 LAB — URINALYSIS, ROUTINE W REFLEX MICROSCOPIC
Bilirubin Urine: NEGATIVE
Glucose, UA: NEGATIVE
Hgb urine dipstick: NEGATIVE
KETONES UR: NEGATIVE
Leukocytes, UA: NEGATIVE
NITRITE: NEGATIVE
Protein, ur: NEGATIVE
Specific Gravity, Urine: 1.015 (ref 1.001–1.03)
pH: 5.5 (ref 5.0–8.0)

## 2018-04-29 LAB — COMPLETE METABOLIC PANEL WITH GFR
AG Ratio: 1.7 (calc) (ref 1.0–2.5)
ALKALINE PHOSPHATASE (APISO): 67 U/L (ref 40–115)
ALT: 22 U/L (ref 9–46)
AST: 18 U/L (ref 10–35)
Albumin: 4.5 g/dL (ref 3.6–5.1)
BUN: 22 mg/dL (ref 7–25)
CALCIUM: 10 mg/dL (ref 8.6–10.3)
CO2: 27 mmol/L (ref 20–32)
CREATININE: 1.16 mg/dL (ref 0.70–1.25)
Chloride: 102 mmol/L (ref 98–110)
GFR, EST NON AFRICAN AMERICAN: 64 mL/min/{1.73_m2} (ref >=60)
GFR, Est African American: 74 mL/min/{1.73_m2} (ref >=60)
Globulin: 2.6 g/dL (calc) (ref 1.9–3.7)
Glucose, Bld: 100 mg/dL — ABNORMAL HIGH (ref 65–99)
POTASSIUM: 4.5 mmol/L (ref 3.5–5.3)
Sodium: 138 mmol/L (ref 135–146)
Total Bilirubin: 0.3 mg/dL (ref 0.2–1.2)
Total Protein: 7.1 g/dL (ref 6.1–8.1)

## 2018-04-29 LAB — IRON, TOTAL/TOTAL IRON BINDING CAP
%SAT: 41 % (ref 20–48)
Iron: 138 ug/dL (ref 50–180)
TIBC: 340 ug/dL (ref 250–425)

## 2018-04-29 LAB — CBC WITH DIFFERENTIAL/PLATELET
BASOS ABS: 44 {cells}/uL (ref 0–200)
Basophils Relative: 0.4 %
EOS PCT: 1.7 %
Eosinophils Absolute: 185 cells/uL (ref 15–500)
HCT: 44.5 % (ref 38.5–50.0)
Hemoglobin: 15.5 g/dL (ref 13.2–17.1)
Lymphs Abs: 2256 cells/uL (ref 850–3900)
MCH: 30.8 pg (ref 27.0–33.0)
MCHC: 34.8 g/dL (ref 32.0–36.0)
MCV: 88.5 fL (ref 80.0–100.0)
MONOS PCT: 7.9 %
MPV: 10.3 fL (ref 7.5–12.5)
NEUTROS ABS: 7554 {cells}/uL (ref 1500–7800)
NEUTROS PCT: 69.3 %
Platelets: 357 10*3/uL (ref 140–400)
RBC: 5.03 10*6/uL (ref 4.20–5.80)
RDW: 12.3 % (ref 11.0–15.0)
Total Lymphocyte: 20.7 %
WBC mixed population: 861 cells/uL (ref 200–950)
WBC: 10.9 10*3/uL — AB (ref 3.8–10.8)

## 2018-04-29 LAB — PSA: PSA: 3 ng/mL (ref <=4.0)

## 2018-04-29 LAB — INSULIN, RANDOM: INSULIN: 21.4 u[IU]/mL — AB (ref 2.0–19.6)

## 2018-04-29 LAB — VITAMIN D 25 HYDROXY (VIT D DEFICIENCY, FRACTURES): Vit D, 25-Hydroxy: 71 ng/mL (ref 30–100)

## 2018-04-29 LAB — VITAMIN B12: Vitamin B-12: 495 pg/mL (ref 200–1100)

## 2018-04-30 ENCOUNTER — Encounter: Payer: Self-pay | Admitting: Adult Health

## 2018-04-30 ENCOUNTER — Ambulatory Visit (INDEPENDENT_AMBULATORY_CARE_PROVIDER_SITE_OTHER): Payer: BLUE CROSS/BLUE SHIELD | Admitting: Adult Health

## 2018-04-30 VITALS — BP 110/70 | HR 90 | Temp 97.4°F | Ht 67.25 in | Wt 229.6 lb

## 2018-04-30 DIAGNOSIS — E559 Vitamin D deficiency, unspecified: Secondary | ICD-10-CM | POA: Diagnosis not present

## 2018-04-30 DIAGNOSIS — E1122 Type 2 diabetes mellitus with diabetic chronic kidney disease: Secondary | ICD-10-CM

## 2018-04-30 DIAGNOSIS — E785 Hyperlipidemia, unspecified: Secondary | ICD-10-CM

## 2018-04-30 DIAGNOSIS — E1169 Type 2 diabetes mellitus with other specified complication: Secondary | ICD-10-CM

## 2018-04-30 DIAGNOSIS — I10 Essential (primary) hypertension: Secondary | ICD-10-CM | POA: Diagnosis not present

## 2018-04-30 DIAGNOSIS — F900 Attention-deficit hyperactivity disorder, predominantly inattentive type: Secondary | ICD-10-CM

## 2018-04-30 DIAGNOSIS — Z79899 Other long term (current) drug therapy: Secondary | ICD-10-CM

## 2018-04-30 DIAGNOSIS — F325 Major depressive disorder, single episode, in full remission: Secondary | ICD-10-CM

## 2018-04-30 DIAGNOSIS — N182 Chronic kidney disease, stage 2 (mild): Secondary | ICD-10-CM

## 2018-04-30 NOTE — Patient Instructions (Signed)
Goals    . HEMOGLOBIN A1C < 7.0    . LDL CALC < 70    . Weight (lb) < 200 lb (90.7 kg)       Please increase rosuvastatin to 20 mg (full tab) for LDL goal <70  Try to increase daily exercise a few more minutes - aim for 15-20 min daily (Ok to break up into 2 sessions if needed)   Know what a healthy weight is for you (roughly BMI <25) and aim to maintain this  Aim for 7+ servings of fruits and vegetables daily  65-80+ fluid ounces of water or unsweet tea for healthy kidneys  Limit to max 1 drink of alcohol per day; avoid smoking/tobacco  Limit animal fats in diet for cholesterol and heart health - choose grass fed whenever available  Avoid highly processed foods, and foods high in saturated/trans fats  Aim for low stress - take time to unwind and care for your mental health  Aim for 150 min of moderate intensity exercise weekly for heart health, and weights twice weekly for bone health  Aim for 7-9 hours of sleep daily      When it comes to diets, agreement about the perfect plan isn't easy to find, even among the experts. Experts at the [employer] developed an idea known as the Healthy Eating Plate. Just imagine a plate divided into logical, healthy portions.  The emphasis is on diet quality:  Load up on vegetables and fruits - one-half of your plate: Aim for color and variety, and remember that potatoes don't count.  Go for whole grains - one-quarter of your plate: Whole wheat, barley, wheat berries, quinoa, oats, brown rice, and foods made with them. If you want pasta, go with whole wheat pasta.  Protein power - one-quarter of your plate: Fish, chicken, beans, and nuts are all healthy, versatile protein sources. Limit red meat.  The diet, however, does go beyond the plate, offering a few other suggestions.  Use healthy plant oils, such as olive, canola, soy, corn, sunflower and peanut. Check the labels, and avoid partially hydrogenated oil, which  have unhealthy trans fats.  If you're thirsty, drink water. Coffee and tea are good in moderation, but skip sugary drinks and limit milk and dairy products to one or two daily servings.  The type of carbohydrate in the diet is more important than the amount. Some sources of carbohydrates, such as vegetables, fruits, whole grains, and beans-are healthier than others.  Finally, stay active.

## 2018-05-01 LAB — CBC WITH DIFFERENTIAL/PLATELET
BASOS ABS: 29 {cells}/uL (ref 0–200)
Basophils Relative: 0.3 %
EOS ABS: 223 {cells}/uL (ref 15–500)
Eosinophils Relative: 2.3 %
HCT: 44.2 % (ref 38.5–50.0)
Hemoglobin: 15.3 g/dL (ref 13.2–17.1)
LYMPHS ABS: 2105 {cells}/uL (ref 850–3900)
MCH: 31.2 pg (ref 27.0–33.0)
MCHC: 34.6 g/dL (ref 32.0–36.0)
MCV: 90 fL (ref 80.0–100.0)
MPV: 10.1 fL (ref 7.5–12.5)
Monocytes Relative: 9.8 %
NEUTROS ABS: 6392 {cells}/uL (ref 1500–7800)
Neutrophils Relative %: 65.9 %
Platelets: 363 10*3/uL (ref 140–400)
RBC: 4.91 10*6/uL (ref 4.20–5.80)
RDW: 12.5 % (ref 11.0–15.0)
Total Lymphocyte: 21.7 %
WBC: 9.7 10*3/uL (ref 3.8–10.8)
WBCMIX: 951 {cells}/uL — AB (ref 200–950)

## 2018-05-01 LAB — LIPID PANEL
Cholesterol: 211 mg/dL — ABNORMAL HIGH (ref <200)
HDL: 61 mg/dL (ref >40)
LDL CHOLESTEROL (CALC): 124 mg/dL — AB
NON-HDL CHOLESTEROL (CALC): 150 mg/dL — AB (ref <130)
Total CHOL/HDL Ratio: 3.5 (calc) (ref <5.0)
Triglycerides: 149 mg/dL (ref <150)

## 2018-05-01 LAB — COMPLETE METABOLIC PANEL WITH GFR
AG RATIO: 1.6 (calc) (ref 1.0–2.5)
ALKALINE PHOSPHATASE (APISO): 67 U/L (ref 40–115)
ALT: 20 U/L (ref 9–46)
AST: 17 U/L (ref 10–35)
Albumin: 4.6 g/dL (ref 3.6–5.1)
BILIRUBIN TOTAL: 0.5 mg/dL (ref 0.2–1.2)
BUN / CREAT RATIO: 21 (calc) (ref 6–22)
BUN: 29 mg/dL — ABNORMAL HIGH (ref 7–25)
CO2: 24 mmol/L (ref 20–32)
CREATININE: 1.35 mg/dL — AB (ref 0.70–1.25)
Calcium: 10.3 mg/dL (ref 8.6–10.3)
Chloride: 100 mmol/L (ref 98–110)
GFR, EST AFRICAN AMERICAN: 62 mL/min/{1.73_m2} (ref >=60)
GFR, Est Non African American: 53 mL/min/{1.73_m2} — ABNORMAL LOW (ref >=60)
GLOBULIN: 2.9 g/dL (ref 1.9–3.7)
Glucose, Bld: 174 mg/dL — ABNORMAL HIGH (ref 65–99)
POTASSIUM: 5.1 mmol/L (ref 3.5–5.3)
SODIUM: 135 mmol/L (ref 135–146)
Total Protein: 7.5 g/dL (ref 6.1–8.1)

## 2018-05-01 LAB — TSH: TSH: 2.54 mIU/L (ref 0.40–4.50)

## 2018-05-01 LAB — HEMOGLOBIN A1C
EAG (MMOL/L): 9 (calc)
Hgb A1c MFr Bld: 7.3 % of total Hgb — ABNORMAL HIGH (ref <5.7)
MEAN PLASMA GLUCOSE: 163 (calc)

## 2018-05-01 LAB — MAGNESIUM: Magnesium: 2 mg/dL (ref 1.5–2.5)

## 2018-05-17 ENCOUNTER — Other Ambulatory Visit: Payer: Self-pay | Admitting: Internal Medicine

## 2018-06-21 ENCOUNTER — Other Ambulatory Visit: Payer: Self-pay | Admitting: Adult Health

## 2018-07-09 ENCOUNTER — Other Ambulatory Visit: Payer: Self-pay

## 2018-07-09 DIAGNOSIS — F900 Attention-deficit hyperactivity disorder, predominantly inattentive type: Secondary | ICD-10-CM

## 2018-07-09 MED ORDER — METHYLPHENIDATE HCL ER (OSM) 36 MG PO TBCR: EXTENDED_RELEASE_TABLET | ORAL | 0 refills | Status: DC

## 2018-07-09 NOTE — Telephone Encounter (Signed)
Refill request for Concerta. PMP checked, last filled on 03/28/18, #120, 84 day supply. Next office visit on 08/18/18. In que for your review

## 2018-07-30 ENCOUNTER — Encounter: Payer: Self-pay | Admitting: Internal Medicine

## 2018-07-30 NOTE — Progress Notes (Signed)
This very nice 68 y.o. [name] presents for 6 month follow up with HTN, HLD, Pre-Diabetes and Vitamin D Deficiency. Patient is treated w/Concerta for inattentive Adult ADD with improved alertness , focus & concentration.      Patient is treated for HTN (1990's) & BP has been controlled at home. Today's BP is at goal -  124/74. Patient has had no complaints of any cardiac type chest pain, palpitations, dyspnea / orthopnea / PND, dizziness, claudication, or dependent edema.     Hyperlipidemia is controlled with diet & meds. Patient denies myalgias or other med SE's. Last Lipids were not at goal: Lab Results  Component Value Date   CHOL 211 (H) 04/30/2018   HDL 61 04/30/2018   LDLCALC 124 (H) 04/30/2018   TRIG 149 04/30/2018   CHOLHDL 3.5 04/30/2018      Also, the patient has Morbid Obesity (BMI 36+) and  history of T2_NIDDM (2010)  and has had no symptoms of reactive hypoglycemia, diabetic polys, paresthesias or visual blurring.  Patient does endorse poor dietary compliance and last A1c was not at goal:  Lab Results  Component Value Date   HGBA1C 7.3 (H) 04/30/2018      Further, the patient also has history of Vitamin D Deficiency ("26" / 2008) and supplements vitamin D without any suspected side-effects. Last vitamin D was at goal:  Lab Results  Component Value Date   VD25OH 71 01/14/2018   Current Outpatient Medications on File Prior to Visit  Medication Sig  . blood glucose meter kit and supplies KIT Dispense based on patient's  insurance preference, [employer] Elite-E11.22  . FREESTYLE LITE test strip CHECK GLUCOSE THREE TIMES DAILY  . glipiZIDE (GLUCOTROL) 5 MG tablet TAKE 1 TABLET BY MOUTH THREE TIMES DAILY WITH FOOD  . ketoconazole (NIZORAL) 2 % cream Apply 1 application topically 2 (two) times daily.  . Lancets (FREESTYLE) lancets TEST BLOOD SUGAR THREE TIMES DAILY  . metFORMIN (GLUCOPHAGE-XR) 500 MG 24 hr tablet TAKE 4 TABLETS(2000 MG) BY MOUTH DAILY  . methylphenidate  (CONCERTA) 36 MG PO CR tablet Take 1 to 2 tablets daily if needed for alertness. Try to not take daily, more effective.  . olmesartan (BENICAR) 40 MG tablet TAKE 1 TABLET(40 MG) BY MOUTH DAILY  . rosuvastatin (CRESTOR) 20 MG tablet TAKE 1 TABLET BY MOUTH EVERY DAY FOR CHOLESTEROL  . Vitamin D, Ergocalciferol, (DRISDOL) 50000 units CAPS capsule TAKE 1 CAPSULE BY MOUTH 3 TIMES EVERY WEEK  . sildenafil (VIAGRA) 100 MG tablet Take 1 tablet (100 mg total) by mouth as needed for erectile dysfunction.   No current facility-administered medications on file prior to visit.    Allergies  Allergen Reactions  . Adderall [Amphetamine-Dextroamphet Er]     MOOD CHANGE  . Lipitor [Atorvastatin]     MYALGIA  . Wellbutrin [Bupropion]     HA  . Zoloft [Sertraline Hcl]     AGGITATION   PMHx:   Past Medical History:  Diagnosis Date  . ADD (attention deficit disorder)   . Anxiety   . Depression   . Diabetes mellitus without complication ([place])   . Hyperlipidemia   . Hypertension   . Unspecified vitamin D deficiency    Immunization History  Administered Date(s) Administered  . DT 09/16/2014  . PPD Test 09/16/2014, 10/28/2015  . Pneumococcal-Unspecified 07/31/1999  . Td 07/31/2003  . Zoster 05/26/2013   History reviewed. No pertinent surgical history. FHx:    Reviewed / unchanged  SHx:  Reviewed / unchanged   Systems Review:  Constitutional: Denies fever, chills, wt changes, headaches, insomnia, fatigue, night sweats, change in appetite. Eyes: Denies redness, blurred vision, diplopia, discharge, itchy, watery eyes.  ENT: Denies discharge, congestion, post nasal drip, epistaxis, sore throat, earache, hearing loss, dental pain, tinnitus, vertigo, sinus pain, snoring.  CV: Denies chest pain, palpitations, irregular heartbeat, syncope, dyspnea, diaphoresis, orthopnea, PND, claudication or edema. Respiratory: denies cough, dyspnea, DOE, pleurisy, hoarseness, laryngitis, wheezing.    Gastrointestinal: Denies dysphagia, odynophagia, heartburn, reflux, water brash, abdominal pain or cramps, nausea, vomiting, bloating, diarrhea, constipation, hematemesis, melena, hematochezia  or hemorrhoids. Genitourinary: Denies dysuria, frequency, urgency, nocturia, hesitancy, discharge, hematuria or flank pain. Musculoskeletal: Denies arthralgias, myalgias, stiffness, jt. swelling, pain, limping or strain/sprain.  Skin: Denies pruritus, rash, hives, warts, acne, eczema or change in skin lesion(s). Neuro: No weakness, tremor, incoordination, spasms, paresthesia or pain. Psychiatric: Denies confusion, memory loss or sensory loss. Endo: Denies change in weight, skin or hair change.  Heme/Lymph: No excessive bleeding, bruising or enlarged lymph nodes.  Physical Exam  BP 124/74   Pulse 80   Temp (!) 97.1 F (36.2 C)   Resp 16   Ht 5' 7.25" (1.708 m)   Wt 231 lb 12.8 oz (105.1 kg)   BMI 36.04 kg/m   Appears  well nourished, well groomed  and in no distress.  Eyes: PERRLA, EOMs, conjunctiva no swelling or erythema. Sinuses: No frontal/maxillary tenderness ENT/Mouth: EAC's clear, TM's nl w/o erythema, bulging. Nares clear w/o erythema, swelling, exudates. Oropharynx clear without erythema or exudates. Oral hygiene is good. Tongue normal, non obstructing. Hearing intact.  Neck: Supple. Thyroid not palpable. Car 2+/2+ without bruits, nodes or JVD. Chest: Respirations nl with BS clear & equal w/o rales, rhonchi, wheezing or stridor.  Cor: Heart sounds normal w/ regular rate and rhythm without sig. murmurs, gallops, clicks or rubs. Peripheral pulses normal and equal  without edema.  Abdomen: Soft & bowel sounds normal. Non-tender w/o guarding, rebound, hernias, masses or organomegaly.  Lymphatics: Unremarkable.  Musculoskeletal: Full ROM all peripheral extremities, joint stability, 5/5 strength and normal gait.  Skin: Warm, dry without exposed rashes, lesions or ecchymosis apparent.   Neuro: Cranial nerves intact, reflexes equal bilaterally. Sensory-motor testing grossly intact. Tendon reflexes grossly intact.  Pysch: Alert & oriented x 3.  Insight and judgement nl & appropriate. No ideations.  Assessment and Plan:  1. Essential hypertension  - Continue medication, monitor blood pressure at home.  - Continue DASH diet.  Reminder to go to the ER if any CP,  SOB, nausea, dizziness, severe HA, changes vision/speech.  - CBC with Differential/Platelet - COMPLETE METABOLIC PANEL WITH GFR - Magnesium - TSH  2. Hyperlipidemia, mixed  - Continue diet/meds, exercise,& lifestyle modifications.  - Continue monitor periodic cholesterol/liver & renal functions   - Lipid panel - TSH  3. Type 2 diabetes mellitus with stage 2 chronic kidney disease, without long-term current use of insulin (HCC)  - Continue diet, exercise,  - lifestyle modifications.  - Monitor appropriate labs.  - Hemoglobin A1c - Insulin, random  4. Vitamin D deficiency  - Continue supplementation.  - VITAMIN D 25 Hydroxyl  5. Medication management  - CBC with Differential/Platelet - COMPLETE METABOLIC PANEL WITH GFR - Magnesium - Lipid panel - TSH - Hemoglobin A1c - Insulin, random - VITAMIN D 25 Hydroxyl        Discussed  regular exercise, BP monitoring, weight control to achieve/maintain BMI less than 25 and discussed med and SE's.  Recommended labs to assess and monitor clinical status with further disposition pending results of labs. Over 30 minutes of exam, counseling, chart review was performed.

## 2018-07-30 NOTE — Patient Instructions (Signed)

## 2018-07-31 ENCOUNTER — Ambulatory Visit: Payer: BLUE CROSS/BLUE SHIELD | Admitting: Internal Medicine

## 2018-07-31 VITALS — BP 124/74 | HR 80 | Temp 97.1°F | Resp 16 | Ht 67.25 in | Wt 231.8 lb

## 2018-07-31 DIAGNOSIS — E559 Vitamin D deficiency, unspecified: Secondary | ICD-10-CM | POA: Diagnosis not present

## 2018-07-31 DIAGNOSIS — I10 Essential (primary) hypertension: Secondary | ICD-10-CM | POA: Diagnosis not present

## 2018-07-31 DIAGNOSIS — Z79899 Other long term (current) drug therapy: Secondary | ICD-10-CM

## 2018-07-31 DIAGNOSIS — N182 Chronic kidney disease, stage 2 (mild): Secondary | ICD-10-CM

## 2018-07-31 DIAGNOSIS — E1122 Type 2 diabetes mellitus with diabetic chronic kidney disease: Secondary | ICD-10-CM | POA: Diagnosis not present

## 2018-07-31 DIAGNOSIS — Z1211 Encounter for screening for malignant neoplasm of colon: Secondary | ICD-10-CM

## 2018-07-31 DIAGNOSIS — Z1212 Encounter for screening for malignant neoplasm of rectum: Secondary | ICD-10-CM

## 2018-07-31 DIAGNOSIS — E782 Mixed hyperlipidemia: Secondary | ICD-10-CM | POA: Diagnosis not present

## 2018-07-31 MED ORDER — GLIPIZIDE 5 MG PO TABS: ORAL_TABLET | ORAL | 3 refills | Status: DC

## 2018-08-01 LAB — CBC WITH DIFFERENTIAL/PLATELET
ABSOLUTE MONOCYTES: 681 {cells}/uL (ref 200–950)
BASOS ABS: 42 {cells}/uL (ref 0–200)
Basophils Relative: 0.5 %
EOS ABS: 199 {cells}/uL (ref 15–500)
Eosinophils Relative: 2.4 %
HEMATOCRIT: 46 % (ref 38.5–50.0)
Hemoglobin: 16 g/dL (ref 13.2–17.1)
Lymphs Abs: 2067 cells/uL (ref 850–3900)
MCH: 31.6 pg (ref 27.0–33.0)
MCHC: 34.8 g/dL (ref 32.0–36.0)
MCV: 90.9 fL (ref 80.0–100.0)
MPV: 10.7 fL (ref 7.5–12.5)
Monocytes Relative: 8.2 %
NEUTROS PCT: 64 %
Neutro Abs: 5312 cells/uL (ref 1500–7800)
Platelets: 330 10*3/uL (ref 140–400)
RBC: 5.06 10*6/uL (ref 4.20–5.80)
RDW: 12.2 % (ref 11.0–15.0)
TOTAL LYMPHOCYTE: 24.9 %
WBC: 8.3 10*3/uL (ref 3.8–10.8)

## 2018-08-01 LAB — LIPID PANEL
Cholesterol: 186 mg/dL (ref <200)
HDL: 58 mg/dL (ref >40)
LDL Cholesterol (Calc): 86 mg/dL (calc)
Non-HDL Cholesterol (Calc): 128 mg/dL (calc) (ref <130)
Total CHOL/HDL Ratio: 3.2 (calc) (ref <5.0)
Triglycerides: 348 mg/dL — ABNORMAL HIGH (ref <150)

## 2018-08-01 LAB — COMPLETE METABOLIC PANEL WITH GFR
AG RATIO: 2 (calc) (ref 1.0–2.5)
ALBUMIN MSPROF: 4.5 g/dL (ref 3.6–5.1)
ALT: 17 U/L (ref 9–46)
AST: 15 U/L (ref 10–35)
Alkaline phosphatase (APISO): 69 U/L (ref 40–115)
BILIRUBIN TOTAL: 0.4 mg/dL (ref 0.2–1.2)
BUN / CREAT RATIO: 16 (calc) (ref 6–22)
BUN: 20 mg/dL (ref 7–25)
CALCIUM: 9.1 mg/dL (ref 8.6–10.3)
CHLORIDE: 104 mmol/L (ref 98–110)
CO2: 25 mmol/L (ref 20–32)
Creat: 1.27 mg/dL — ABNORMAL HIGH (ref 0.70–1.25)
GFR, EST AFRICAN AMERICAN: 66 mL/min/{1.73_m2} (ref >=60)
GFR, EST NON AFRICAN AMERICAN: 57 mL/min/{1.73_m2} — AB (ref >=60)
GLOBULIN: 2.3 g/dL (ref 1.9–3.7)
Glucose, Bld: 189 mg/dL — ABNORMAL HIGH (ref 65–99)
POTASSIUM: 4.6 mmol/L (ref 3.5–5.3)
SODIUM: 137 mmol/L (ref 135–146)
TOTAL PROTEIN: 6.8 g/dL (ref 6.1–8.1)

## 2018-08-01 LAB — MAGNESIUM: Magnesium: 1.9 mg/dL (ref 1.5–2.5)

## 2018-08-01 LAB — INSULIN, RANDOM: Insulin: 24.6 u[IU]/mL — ABNORMAL HIGH (ref 2.0–19.6)

## 2018-08-01 LAB — HEMOGLOBIN A1C
Hgb A1c MFr Bld: 7.7 % of total Hgb — ABNORMAL HIGH (ref <5.7)
Mean Plasma Glucose: 174 (calc)
eAG (mmol/L): 9.7 (calc)

## 2018-08-01 LAB — TSH: TSH: 2.67 m[IU]/L (ref 0.40–4.50)

## 2018-08-01 LAB — VITAMIN D 25 HYDROXY (VIT D DEFICIENCY, FRACTURES): VIT D 25 HYDROXY: 40 ng/mL (ref 30–100)

## 2018-10-23 ENCOUNTER — Encounter: Payer: Self-pay | Admitting: Internal Medicine

## 2018-10-29 NOTE — Progress Notes (Signed)
FOLLOW UP  Assessment and Plan:   Hypertension Well controlled with current medications  Monitor blood pressure at home; patient to call if consistently greater than 130/80 Continue DASH diet.   Reminder to go to the ER if any CP, SOB, nausea, dizziness, severe HA, changes vision/speech, left arm numbness and tingling and jaw pain.  Cholesterol Currently above goal; increase to rosuvastatin 20 mg daily  Continue low cholesterol diet and exercise.  Check lipid panel.   Diabetes with diabetic chronic kidney disease Continue medication: metformin, glipizide - Continue with diet/exercise.  Continue diet and exercise.  Perform daily foot/skin check, notify office of any concerning changes.  Check A1C Check sugars around 3-5 if he feels funny, see if low sugar  Obesity with co morbidities Long discussion about weight loss, diet, and exercise Recommended diet heavy in fruits and veggies and low in animal meats, cheeses, and dairy products, appropriate calorie intake Discussed ideal weight for height Patient will work on increasing exercise, watching diet - increase vegetables Will follow up in 3 months  Vitamin D Def At goal at last visit; continue supplementation to maintain goal of 70-100  ADD Continue medications Helps with focus, no AE's. The patient was counseled on the addictive nature of the medication and was encouraged to take drug holidays when not needed.    Continue diet and meds as discussed. Further disposition pending results of labs. Discussed med's effects and SE's.   Over 30 minutes of exam, counseling, chart review, and critical decision making was performed.   Future Appointments  Date Time Provider [place]  02/12/2019  2:00 PM Unk Pinto, MD [facility] None    ----------------------------------------------------------------------------------------------------------------------  HPI 68 y.o. male  presents for 3 month follow up on  hypertension, cholesterol, diabetes, morbid obesity, ADD and vitamin D deficiency.   Patient is on an ADD medication (concerta 36 mg x 2 tabs) he states that the medication is helping and he denies any adverse reactions. He currently takes daily - cannot focus otherwise, helps keep him on track and productive.   BMI is Body mass index is 36.81 kg/m., he has been working on diet and exercise - he is doing bow flex and ellipitical, but has been crazy at work. Wt Readings from Last 3 Encounters:  10/30/18 235 lb (106.6 kg)  07/31/18 231 lb 12.8 oz (105.1 kg)  04/30/18 229 lb 9.6 oz (104.1 kg)   His blood pressure has been controlled at home, today their BP is BP: 122/80  He does workout. He denies chest pain, shortness of breath, dizziness.   He is on cholesterol medication (crestor 20 mg daily) and denies myalgias. His cholesterol is not at goal of less than 70. The cholesterol last visit was:   Lab Results  Component Value Date   CHOL 186 07/31/2018   HDL 58 07/31/2018   LDLCALC 86 07/31/2018   TRIG 348 (H) 07/31/2018   CHOLHDL 3.2 07/31/2018    He has been working on diet and exercise for T2 diabetes (on metformin and glipizide 5 mg BID) , and denies foot ulcerations, increased appetite, nausea, paresthesia of the feet, polydipsia, polyuria, visual disturbances, vomiting and weight loss. He does currently check sugars runs 120s in the mornings, he will occ get a low sugar in the morning if he only eats nuts and not his normal biscuit. Will have some diarrhea with metformin Last A1C in the office was:  Lab Results  Component Value Date   HGBA1C 7.7 (H) 07/31/2018  Patient is on Vitamin D supplement and at goal at recent check:    Lab Results  Component Value Date   VD25OH 40 07/31/2018        Current Medications:  Current Outpatient Medications on File Prior to Visit  Medication Sig  . blood glucose meter kit and supplies KIT Dispense based on patient's  insurance preference,  [employer] Elite-E11.22  . FREESTYLE LITE test strip CHECK GLUCOSE THREE TIMES DAILY  . glipiZIDE (GLUCOTROL) 5 MG tablet TAKE 1 TABLET BY MOUTH THREE TIMES DAILY WITH FOOD  . ketoconazole (NIZORAL) 2 % cream Apply 1 application topically 2 (two) times daily.  . Lancets (FREESTYLE) lancets TEST BLOOD SUGAR THREE TIMES DAILY  . metFORMIN (GLUCOPHAGE-XR) 500 MG 24 hr tablet TAKE 4 TABLETS(2000 MG) BY MOUTH DAILY  . olmesartan (BENICAR) 40 MG tablet TAKE 1 TABLET(40 MG) BY MOUTH DAILY  . rosuvastatin (CRESTOR) 20 MG tablet TAKE 1 TABLET BY MOUTH EVERY DAY FOR CHOLESTEROL  . Vitamin D, Ergocalciferol, (DRISDOL) 50000 units CAPS capsule TAKE 1 CAPSULE BY MOUTH 3 TIMES EVERY WEEK  . methylphenidate (CONCERTA) 36 MG PO CR tablet Take 1 to 2 tablets daily if needed for alertness. Try to not take daily, more effective.  . sildenafil (VIAGRA) 100 MG tablet Take 1 tablet (100 mg total) by mouth as needed for erectile dysfunction.   No current facility-administered medications on file prior to visit.      Allergies:  Allergies  Allergen Reactions  . Adderall [Amphetamine-Dextroamphet Er]     MOOD CHANGE  . Lipitor [Atorvastatin]     MYALGIA  . Wellbutrin [Bupropion]     HA  . Zoloft [Sertraline Hcl]     AGGITATION     Medical History:  Past Medical History:  Diagnosis Date  . ADD (attention deficit disorder)   . Anxiety   . Depression   . Diabetes mellitus without complication ([place])   . Hyperlipidemia   . Hypertension   . Unspecified vitamin D deficiency    Family history- Reviewed and unchanged Social history- Reviewed and unchanged   Review of Systems:  Review of Systems  Constitutional: Negative for malaise/fatigue and weight loss.  HENT: Negative for hearing loss and tinnitus.   Eyes: Negative for blurred vision and double vision.  Respiratory: Negative for cough, shortness of breath and wheezing.   Cardiovascular: Negative for chest pain, palpitations, orthopnea, claudication  and leg swelling.  Gastrointestinal: Negative for abdominal pain, blood in stool, constipation, diarrhea, heartburn, melena, nausea and vomiting.  Genitourinary: Negative.   Musculoskeletal: Negative for joint pain and myalgias.  Skin: Negative for rash.  Neurological: Negative for dizziness, tingling, sensory change, weakness and headaches.  Endo/Heme/Allergies: Negative for polydipsia.  Psychiatric/Behavioral: Negative.   All other systems reviewed and are negative.   Physical Exam: BP 122/80   Pulse 83   Temp (!) 97.3 F (36.3 C)   Ht 5' 7"  (1.702 m)   Wt 235 lb (106.6 kg)   SpO2 97%   BMI 36.81 kg/m  Wt Readings from Last 3 Encounters:  10/30/18 235 lb (106.6 kg)  07/31/18 231 lb 12.8 oz (105.1 kg)  04/30/18 229 lb 9.6 oz (104.1 kg)   General Appearance: Well nourished, in no apparent distress. Eyes: PERRLA, EOMs, conjunctiva no swelling or erythema Sinuses: No Frontal/maxillary tenderness ENT/Mouth: Ext aud canals clear, TMs without erythema, bulging. No erythema, swelling, or exudate on post pharynx.  Tonsils not swollen or erythematous. Hearing normal.  Neck: Supple, thyroid normal.  Respiratory: Respiratory  effort normal, BS equal bilaterally without rales, rhonchi, wheezing or stridor.  Cardio: RRR with no MRGs. Brisk peripheral pulses without edema.  Abdomen: Soft, + BS.  Non tender, no guarding, rebound, hernias, masses. Lymphatics: Non tender without lymphadenopathy.  Musculoskeletal: Full ROM, 5/5 strength, Normal gait Skin: Warm, dry without rashes, lesions, ecchymosis.  Neuro: Cranial nerves intact. No cerebellar symptoms.  Psych: Awake and oriented X 3, normal affect, Insight and Judgment appropriate.    Vicie Mutters, PA-C 8:49 AM [facility]

## 2018-10-30 ENCOUNTER — Ambulatory Visit: Payer: BLUE CROSS/BLUE SHIELD | Admitting: Physician Assistant

## 2018-10-30 ENCOUNTER — Encounter: Payer: Self-pay | Admitting: Physician Assistant

## 2018-10-30 ENCOUNTER — Other Ambulatory Visit: Payer: Self-pay

## 2018-10-30 VITALS — BP 122/80 | HR 83 | Temp 97.3°F | Ht 67.0 in | Wt 235.0 lb

## 2018-10-30 DIAGNOSIS — Z79899 Other long term (current) drug therapy: Secondary | ICD-10-CM

## 2018-10-30 DIAGNOSIS — E785 Hyperlipidemia, unspecified: Secondary | ICD-10-CM

## 2018-10-30 DIAGNOSIS — E1122 Type 2 diabetes mellitus with diabetic chronic kidney disease: Secondary | ICD-10-CM

## 2018-10-30 DIAGNOSIS — E559 Vitamin D deficiency, unspecified: Secondary | ICD-10-CM | POA: Diagnosis not present

## 2018-10-30 DIAGNOSIS — I10 Essential (primary) hypertension: Secondary | ICD-10-CM

## 2018-10-30 DIAGNOSIS — N182 Chronic kidney disease, stage 2 (mild): Secondary | ICD-10-CM

## 2018-10-30 DIAGNOSIS — F325 Major depressive disorder, single episode, in full remission: Secondary | ICD-10-CM | POA: Diagnosis not present

## 2018-10-30 DIAGNOSIS — E1169 Type 2 diabetes mellitus with other specified complication: Secondary | ICD-10-CM

## 2018-10-30 DIAGNOSIS — F900 Attention-deficit hyperactivity disorder, predominantly inattentive type: Secondary | ICD-10-CM

## 2018-10-30 MED ORDER — VITAMIN D (ERGOCALCIFEROL) 1.25 MG (50000 UNIT) PO CAPS: ORAL_CAPSULE | ORAL | 1 refills | Status: DC

## 2018-10-30 MED ORDER — METHYLPHENIDATE HCL ER (OSM) 36 MG PO TBCR: EXTENDED_RELEASE_TABLET | ORAL | 0 refills | Status: DC

## 2018-10-30 NOTE — Patient Instructions (Addendum)
My chart [id number]  Check sugars when you are feeling funny and let me know what it is   We are starting you on Metformin to prevent or treat diabetes.  Metformin does NOT cause low blood sugars.   In order to create energy your cells need insulin and sugar but sometime your cells do not accept the insulin and this can cause increased sugars and decreased energy.   The Metformin helps your cells accept insulin and the sugar this help: 1) increase your energy  2) weight loss.    The two most common side effects are nausea and diarrhea, follow these rules to avoid it but these symptoms get better with time on the medication.    ALSO You can take imodium per box instructions when starting metformin if needed.   Rules of metformin: 1) start out slow with only one pill daily. Our goal for you is 4 pills a day or 2000mg  total.  2) take with your largest meal. 3) Take with least amount of carbs.   Call if you have any problems.   AN UPDATE FROM [facility]  At this time we are STILL NOT accepting walk in appointments or sick visits.  We are still having asymptomatic patients come into the office however we are asking that you call the office from your car to notify that you are here and we can check you into your appointment via the telephone.  We will have a nurse call you when she is ready for you to come into the office, this way we will not have patients waiting in the waiting room and can be more efficient.   Virtual visits and Evisits For your convenience and to prevent transmission, we are now performing Evisits or telephone encounters for virtual visits for sick visits and regular scheduled follow up visits.  You can contact one of your providers through [place], your patient portal at any time or call the office during business hours.   Medicare and insurances are covering this during this crisis.  This helps you because we are able to  talk about your conditions, discuss things to monitor and do; in addition, it also helps [place] as a practice. We are a small private practice and need our patients help to remain open by doing these visits.  So please consider doing a virtual visit rather than rescheduling completely!  PLEASE DO NOT CLICK THE EVISIT BUTTON ON MY CHART, INSTEAD CHOOSE TO MESSAGE YOUR PROVIDER AND SEND [place] A MESSAGE WITH YOUR ISSUES.   As a community we are no longer testing patients with mild symptoms.  If the symptoms are mild, we are NOT testing patients to conserve supplies and capacity so our health care workers can care for people who need medical attention even during the peak of the outbreak.  Patients with mild symptoms are being instructed to stay at home and recover for 2 weeks.  You can stay in contact with [place] during that time by mychart, telephone calls or we are starting virtual office visits with video capability.   Mild symptoms include cough and fever WITHOUT any of the following symptoms: Difficulty breathing, chest discomfort, altered thinking and confusion.  Please notify us immediately if you have these symptoms.   We have also decided NOT to do testing in our office for coronavirus at this time though we are continually accessing the situation as needed and will notify you if this changes.  If after a mychart communication or telephone visit, we feel you need testing then we will put in an order for you to get tested at a specimen collection site through [facility].   We will not test you if you do not have symptoms or have not had potential contact at this time.  We will not test you if you just show up to the office, you need an appointment specifically for testing, please call or message first.   Please remember that this virus is happening at the same time as other respiratory viruses, such as influenza, common colds and now allergy season.   To reduce your risk of infection we  recommend the same precautions as those used to avoid the common cold and flu virus  . Please wash your hands with soap and water for 20 seconds and frequently. Wendee Copp your hands before you eat or touch your face.  . Avoid touching your face, eyes, nose, and mouth as much as possible.  . if needing to cough or sneeze cover your mouth and nose by coughing or sneezing into your elbow area, your sleeve or a tissue . Routinely clean frequently touched items such as doorknobs, keyboards, and phones.  . Avoid crowds of people.  Marland Kitchen avoid shaking hands with others; . We recommend anyone within the high-risk demographics such as older adults and those with heart/lung disease, auto-immune or immune-suppressing health conditions to consider reduced travel and public outings.  Please refer to the sources below for current updates, guidelines and recommendations.  You can call this hotline set up by [facility] hospital 1 877 40COVID [phone])  You can visit these websites: CDC.gov WHO.int  Please visit the [place] webpage for the latest health notices and updates on local and international travel.  What are the symptoms? There are many different symptoms reported HOWEVER the most common associated with the virus at this time is FEVER, DRY COUGH, and SHORTNESS OF BREATH.   What to do if you have symptoms or you feel that you have come in contact with someone that may be infected with coronavirus? Please stay at home.  Call our office or send a mychart message.  Please remember the majority of cases are self-limited and do not require in hospital intervention.  There is no specific treatment for a mild case of the virus other than supportive care.  Please only go to the ER if your symptoms escalate such as worsening shortness of breath, chest pain, and confusion.   Do you need a mask? You do not need a mask if you do not have symptoms. If you have a fever, cough, or shortness of breath  please wear a mask to your appointment if you have one or we will provide one for you.      When it comes to diets, agreement about the perfect plan isn't easy to find, even among the experts. Experts at the [employer] developed an idea known as the Healthy Eating Plate. Just imagine a plate divided into logical, healthy portions.  The emphasis is on diet quality:  Load up on vegetables and fruits - one-half of your plate: Aim for color and variety, and remember that potatoes don't count.  Go for whole grains - one-quarter of your plate: Whole wheat, barley, wheat berries, quinoa, oats, brown rice, and foods made with them. If you want pasta, go with whole wheat pasta.  Protein power - one-quarter of your plate:  Fish, chicken, beans, and nuts are all healthy, versatile protein sources. Limit red meat.  The diet, however, does go beyond the plate, offering a few other suggestions.  Use healthy plant oils, such as olive, canola, soy, corn, sunflower and peanut. Check the labels, and avoid partially hydrogenated oil, which have unhealthy trans fats.  If you're thirsty, drink water. Coffee and tea are good in moderation, but skip sugary drinks and limit milk and dairy products to one or two daily servings.  The type of carbohydrate in the diet is more important than the amount. Some sources of carbohydrates, such as vegetables, fruits, whole grains, and beans-are healthier than others.  Finally, stay active.

## 2018-10-31 LAB — CBC WITH DIFFERENTIAL/PLATELET
Absolute Monocytes: 709 cells/uL (ref 200–950)
Basophils Absolute: 39 cells/uL (ref 0–200)
Basophils Relative: 0.6 %
Eosinophils Absolute: 98 cells/uL (ref 15–500)
Eosinophils Relative: 1.5 %
HCT: 42.8 % (ref 38.5–50.0)
Hemoglobin: 15 g/dL (ref 13.2–17.1)
Lymphs Abs: 1703 cells/uL (ref 850–3900)
MCH: 31.4 pg (ref 27.0–33.0)
MCHC: 35 g/dL (ref 32.0–36.0)
MCV: 89.7 fL (ref 80.0–100.0)
MPV: 10.3 fL (ref 7.5–12.5)
Monocytes Relative: 10.9 %
Neutro Abs: 3952 cells/uL (ref 1500–7800)
Neutrophils Relative %: 60.8 %
Platelets: 342 10*3/uL (ref 140–400)
RBC: 4.77 10*6/uL (ref 4.20–5.80)
RDW: 12.2 % (ref 11.0–15.0)
Total Lymphocyte: 26.2 %
WBC: 6.5 10*3/uL (ref 3.8–10.8)

## 2018-10-31 LAB — COMPLETE METABOLIC PANEL WITH GFR
AG Ratio: 1.6 (calc) (ref 1.0–2.5)
ALT: 17 U/L (ref 9–46)
AST: 12 U/L (ref 10–35)
Albumin: 4.5 g/dL (ref 3.6–5.1)
Alkaline phosphatase (APISO): 69 U/L (ref 35–144)
BUN: 16 mg/dL (ref 7–25)
CO2: 26 mmol/L (ref 20–32)
Calcium: 9.7 mg/dL (ref 8.6–10.3)
Chloride: 103 mmol/L (ref 98–110)
Creat: 1.16 mg/dL (ref 0.70–1.18)
GFR, Est African American: 74 mL/min/{1.73_m2} (ref >=60)
GFR, Est Non African American: 63 mL/min/{1.73_m2} (ref >=60)
Globulin: 2.8 g/dL (calc) (ref 1.9–3.7)
Glucose, Bld: 159 mg/dL — ABNORMAL HIGH (ref 65–99)
Potassium: 4.4 mmol/L (ref 3.5–5.3)
Sodium: 137 mmol/L (ref 135–146)
Total Bilirubin: 0.5 mg/dL (ref 0.2–1.2)
Total Protein: 7.3 g/dL (ref 6.1–8.1)

## 2018-10-31 LAB — LIPID PANEL
Cholesterol: 198 mg/dL (ref <200)
HDL: 60 mg/dL (ref >=40)
LDL Cholesterol (Calc): 101 mg/dL (calc) — ABNORMAL HIGH
Non-HDL Cholesterol (Calc): 138 mg/dL (calc) — ABNORMAL HIGH (ref <130)
Total CHOL/HDL Ratio: 3.3 (calc) (ref <5.0)
Triglycerides: 241 mg/dL — ABNORMAL HIGH (ref <150)

## 2018-10-31 LAB — VITAMIN D 25 HYDROXY (VIT D DEFICIENCY, FRACTURES): Vit D, 25-Hydroxy: 42 ng/mL (ref 30–100)

## 2018-10-31 LAB — TSH: TSH: 2.89 mIU/L (ref 0.40–4.50)

## 2018-10-31 LAB — HEMOGLOBIN A1C
Hgb A1c MFr Bld: 8.2 % of total Hgb — ABNORMAL HIGH (ref <5.7)
Mean Plasma Glucose: 189 (calc)
eAG (mmol/L): 10.4 (calc)

## 2018-10-31 LAB — MAGNESIUM: Magnesium: 1.9 mg/dL (ref 1.5–2.5)

## 2018-11-25 ENCOUNTER — Other Ambulatory Visit: Payer: Self-pay | Admitting: Internal Medicine

## 2019-02-11 ENCOUNTER — Other Ambulatory Visit: Payer: Self-pay | Admitting: Internal Medicine

## 2019-02-11 DIAGNOSIS — F900 Attention-deficit hyperactivity disorder, predominantly inattentive type: Secondary | ICD-10-CM

## 2019-02-11 MED ORDER — METHYLPHENIDATE HCL ER (OSM) 36 MG PO TBCR: EXTENDED_RELEASE_TABLET | ORAL | 0 refills | Status: DC

## 2019-02-12 ENCOUNTER — Encounter: Payer: Self-pay | Admitting: Internal Medicine

## 2019-02-23 ENCOUNTER — Other Ambulatory Visit: Payer: Self-pay | Admitting: Internal Medicine

## 2019-02-23 DIAGNOSIS — I10 Essential (primary) hypertension: Secondary | ICD-10-CM

## 2019-02-23 DIAGNOSIS — E1122 Type 2 diabetes mellitus with diabetic chronic kidney disease: Secondary | ICD-10-CM

## 2019-02-23 MED ORDER — METFORMIN HCL ER 500 MG PO TB24: ORAL_TABLET | ORAL | 3 refills | Status: DC

## 2019-02-23 MED ORDER — OLMESARTAN MEDOXOMIL 40 MG PO TABS: ORAL_TABLET | ORAL | 3 refills | Status: DC

## 2019-03-04 ENCOUNTER — Other Ambulatory Visit: Payer: Self-pay | Admitting: Internal Medicine

## 2019-03-04 DIAGNOSIS — I10 Essential (primary) hypertension: Secondary | ICD-10-CM

## 2019-03-04 MED ORDER — TELMISARTAN 80 MG PO TABS: ORAL_TABLET | ORAL | 3 refills | Status: DC

## 2019-05-13 ENCOUNTER — Other Ambulatory Visit: Payer: Self-pay

## 2019-05-13 ENCOUNTER — Encounter: Payer: Self-pay | Admitting: Internal Medicine

## 2019-05-13 ENCOUNTER — Ambulatory Visit: Payer: BC Managed Care – PPO | Admitting: Internal Medicine

## 2019-05-13 VITALS — BP 130/76 | HR 80 | Temp 97.0°F | Resp 16 | Ht 66.5 in | Wt 227.8 lb

## 2019-05-13 DIAGNOSIS — Z136 Encounter for screening for cardiovascular disorders: Secondary | ICD-10-CM

## 2019-05-13 DIAGNOSIS — Z79899 Other long term (current) drug therapy: Secondary | ICD-10-CM | POA: Diagnosis not present

## 2019-05-13 DIAGNOSIS — Z1329 Encounter for screening for other suspected endocrine disorder: Secondary | ICD-10-CM | POA: Diagnosis not present

## 2019-05-13 DIAGNOSIS — Z130 Encounter for screening for diseases of the blood and blood-forming organs and certain disorders involving the immune mechanism: Secondary | ICD-10-CM | POA: Diagnosis not present

## 2019-05-13 DIAGNOSIS — Z131 Encounter for screening for diabetes mellitus: Secondary | ICD-10-CM | POA: Diagnosis not present

## 2019-05-13 DIAGNOSIS — E559 Vitamin D deficiency, unspecified: Secondary | ICD-10-CM

## 2019-05-13 DIAGNOSIS — N401 Enlarged prostate with lower urinary tract symptoms: Secondary | ICD-10-CM

## 2019-05-13 DIAGNOSIS — Z0000 Encounter for general adult medical examination without abnormal findings: Secondary | ICD-10-CM

## 2019-05-13 DIAGNOSIS — R350 Frequency of micturition: Secondary | ICD-10-CM

## 2019-05-13 DIAGNOSIS — Z1389 Encounter for screening for other disorder: Secondary | ICD-10-CM

## 2019-05-13 DIAGNOSIS — Z87891 Personal history of nicotine dependence: Secondary | ICD-10-CM | POA: Diagnosis not present

## 2019-05-13 DIAGNOSIS — E1169 Type 2 diabetes mellitus with other specified complication: Secondary | ICD-10-CM

## 2019-05-13 DIAGNOSIS — N182 Chronic kidney disease, stage 2 (mild): Secondary | ICD-10-CM

## 2019-05-13 DIAGNOSIS — I10 Essential (primary) hypertension: Secondary | ICD-10-CM

## 2019-05-13 DIAGNOSIS — Z13220 Encounter for screening for lipoid disorders: Secondary | ICD-10-CM

## 2019-05-13 DIAGNOSIS — E785 Hyperlipidemia, unspecified: Secondary | ICD-10-CM

## 2019-05-13 DIAGNOSIS — Z1211 Encounter for screening for malignant neoplasm of colon: Secondary | ICD-10-CM

## 2019-05-13 DIAGNOSIS — Z125 Encounter for screening for malignant neoplasm of prostate: Secondary | ICD-10-CM

## 2019-05-13 DIAGNOSIS — Z8249 Family history of ischemic heart disease and other diseases of the circulatory system: Secondary | ICD-10-CM | POA: Diagnosis not present

## 2019-05-13 DIAGNOSIS — Z0001 Encounter for general adult medical examination with abnormal findings: Secondary | ICD-10-CM

## 2019-05-13 DIAGNOSIS — E1122 Type 2 diabetes mellitus with diabetic chronic kidney disease: Secondary | ICD-10-CM

## 2019-05-13 DIAGNOSIS — N138 Other obstructive and reflux uropathy: Secondary | ICD-10-CM

## 2019-05-13 DIAGNOSIS — F900 Attention-deficit hyperactivity disorder, predominantly inattentive type: Secondary | ICD-10-CM

## 2019-05-13 NOTE — Patient Instructions (Signed)

## 2019-05-13 NOTE — Progress Notes (Signed)
Annual  Screening/Preventative Visit  & Comprehensive Evaluation & Examination     This very nice 68 y.o. [name] presents for a Screening /Preventative Visit & comprehensive evaluation and management of multiple medical co-morbidities.  Patient has been followed for HTN, HLD, T2_NIDDM  and Vitamin D Deficiency. Patient has ADD and use Concerta with improved focus & concentration.     HTN predates circa 1990's. Patient's BP has been controlled at home.  Today's BP is at goal -130/76. Patient denies any cardiac symptoms as chest pain, palpitations, shortness of breath, dizziness or ankle swelling.     Patient's hyperlipidemia is near controlled with diet and medications. Patient denies myalgias or other medication SE's. Last lipids were near goal:  Lab Results  Component Value Date   CHOL 198 10/30/2018   HDL 60 10/30/2018   LDLCALC 101 (H) 10/30/2018   TRIG 241 (H) 10/30/2018   CHOLHDL 3.3 10/30/2018      Patient has Morbid Obesity (BMI 36+) and  hx/o T2_NIDDM since 2010 and patient denies reactive hypoglycemic symptoms, visual blurring, diabetic polys or paresthesias. Dietary compliance historically has been poor and last A1c was not at goal:  Lab Results  Component Value Date   HGBA1C 8.2 (H) 10/30/2018       Finally, patient has history of Vitamin D Deficiency ("26" / 2008)  and last vitamin D was not at goal:  Lab Results  Component Value Date   VD25OH 42 10/30/2018   Current Outpatient Medications on File Prior to Visit  Medication Sig  . blood glucose meter kit and supplies KIT Dispense based on patient's  insurance preference, [employer] Elite-E11.22  . FREESTYLE LITE test strip CHECK GLUCOSE THREE TIMES DAILY  . glipiZIDE (GLUCOTROL) 5 MG tablet TAKE 1 TABLET BY MOUTH THREE TIMES DAILY WITH FOOD  . ketoconazole (NIZORAL) 2 % cream Apply 1 application topically 2 (two) times daily.  . Lancets (FREESTYLE) lancets TEST BLOOD SUGAR THREE TIMES DAILY  . metFORMIN (GLUCOPHAGE-XR)  500 MG 24 hr tablet Take 2 tablets 2 x /day with Meals for Diabetes  . rosuvastatin (CRESTOR) 20 MG tablet Take 1 tablet Daily for Cholesterol  . Vitamin D, Ergocalciferol, (DRISDOL) 1.25 MG (50000 UT) CAPS capsule TAKE 1 CAPSULE BY MOUTH 4 TIMES EVERY WEEK  . methylphenidate (CONCERTA) 36 MG PO CR tablet Take 1 to 2 tablets daily if needed for alertness.  More effective if not take daily.  . sildenafil (VIAGRA) 100 MG tablet Take 1 tablet (100 mg total) by mouth as needed for erectile dysfunction.  Marland Kitchen telmisartan (MICARDIS) 80 MG tablet Take 1 tablet Daily for BP (Patient not taking: Reported on 05/13/2019)   No current facility-administered medications on file prior to visit.    Allergies  Allergen Reactions  . Adderall [Amphetamine-Dextroamphet Er]     MOOD CHANGE  . Lipitor [Atorvastatin]     MYALGIA  . Wellbutrin [Bupropion]     HA  . Zoloft [Sertraline Hcl]     AGGITATION   Past Medical History:  Diagnosis Date  . ADD (attention deficit disorder)   . Anxiety   . Depression   . Diabetes mellitus without complication ([place])   . Hyperlipidemia   . Hypertension   . Unspecified vitamin D deficiency    Health Maintenance  Topic Date Due  . Hepatitis C Screening  1951/01/13  . OPHTHALMOLOGY EXAM  06/13/2013  . PNA vac Low Risk Adult (1 of 2 - PCV13) 09/26/2013  . INFLUENZA VACCINE  02/28/2019  .  HEMOGLOBIN A1C  05/01/2019  . FOOT EXAM  05/12/2020  . COLONOSCOPY  06/12/2021  . TETANUS/TDAP  09/16/2024   Immunization History  Administered Date(s) Administered  . DT 09/16/2014  . PPD Test 09/16/2014, 10/28/2015  . Pneumococcal-Unspecified 07/31/1999  . Td 07/31/2003  . Zoster 05/26/2013   Last Colon - 01/24/2007 - Dr Henrene Pastor  Recc 5 yr f/u in 2013 which patient never scheduled.  Family History  Problem Relation Age of Onset  . Hypertension Mother   . Cancer Mother        BREAST  . Hypertension Father   . Hypertension Sister   . Hyperlipidemia Sister    Social  History   Socioeconomic History  . Marital status: Divorced  . Number of children: Not on file  Occupational History  . 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 Smoking status: Former Smoker    Quit date: 09/30/1963    Years since quitting: 55.6  . Smokeless tobacco: Current User    Types: Chew  . Tobacco comment: Cigars  Substance and Sexual Activity  . Alcohol use: No  . Drug use: No  . Sexual activity: Not on file    ROS Constitutional: Denies fever, chills, weight loss/gain, headaches, insomnia,  night sweats or change in appetite. Does c/o fatigue. Eyes: Denies redness, blurred vision, diplopia, discharge, itchy or watery eyes.  ENT: Denies discharge, congestion, post nasal drip, epistaxis, sore throat, earache, hearing loss, dental pain, Tinnitus, Vertigo, Sinus pain or snoring.  Cardio: Denies chest pain, palpitations, irregular heartbeat, syncope, dyspnea, diaphoresis, orthopnea, PND, claudication or edema Respiratory: denies cough, dyspnea, DOE, pleurisy, hoarseness, laryngitis or wheezing.  Gastrointestinal: Denies dysphagia, heartburn, reflux, water brash, pain, cramps, nausea, vomiting, bloating, diarrhea, constipation, hematemesis, melena, hematochezia, jaundice or hemorrhoids Genitourinary: Denies dysuria, frequency, discharge, hematuria or flank pain. Has urgency, nocturia x 2-3 & occasional hesitancy. Musculoskeletal: Denies arthralgia, myalgia, stiffness, Jt. Swelling, pain, limp or strain/sprain. Denies Falls. Skin: Denies puritis, rash, hives, warts, acne, eczema or change in skin lesion Neuro: No weakness, tremor, incoordination, spasms, paresthesia or pain Psychiatric: Denies confusion, memory loss or sensory loss. Denies Depression. Endocrine: Denies change in weight, skin, hair change, nocturia, and paresthesia, diabetic polys, visual blurring or hyper / hypo glycemic episodes.  Heme/Lymph: No excessive bleeding, bruising or enlarged lymph  nodes.  Physical Exam  BP 130/76   Pulse 80   Temp (!) 97 F (36.1 C)   Resp 16   Ht 5' 6.5" (1.689 m)   Wt 227 lb 12.8 oz (103.3 kg)   BMI 36.22 kg/m   General Appearance: Over nourished  and in no apparent distress.  Eyes: PERRLA, EOMs, conjunctiva no swelling or erythema, normal fundi and vessels. Sinuses: No frontal/maxillary tenderness ENT/Mouth: EACs patent / TMs  nl. Nares clear without erythema, swelling, mucoid exudates. Oral hygiene is good. No erythema, swelling, or exudate. Tongue normal, non-obstructing. Tonsils not swollen or erythematous. Hearing normal.  Neck: Supple, thyroid not palpable. No bruits, nodes or JVD. Respiratory: Respiratory effort normal.  BS equal and clear bilateral without rales, rhonci, wheezing or stridor. Cardio: Heart sounds are normal with regular rate and rhythm and no murmurs, rubs or gallops. Peripheral pulses are normal and equal bilaterally without edema. No aortic or femoral bruits. Chest: symmetric with normal excursions and percussion.  Abdomen: Soft, with Nl bowel sounds. Nontender, no guarding, rebound, hernias, masses, or organomegaly.  Lymphatics: Non tender without lymphadenopathy.  Musculoskeletal: Full ROM all peripheral extremities, joint  stability, 5/5 strength, and normal gait. Skin: Warm and dry without rashes, lesions, cyanosis, clubbing or  ecchymosis.  Neuro: Cranial nerves intact, reflexes equal bilaterally. Normal muscle tone, no cerebellar symptoms. Sensation intact to touch, vibratory and Monofilament to the toes bilaterally. Pysch: Alert and oriented X 3 with normal affect, insight and judgment appropriate.   Assessment and Plan  1. Annual Preventative/Screening Exam   2. Essential hypertension  - EKG 12-Lead - [place], RETROPERITNL ABD,  LTD - Urinalysis, Routine w reflex microscopic - Microalbumin / creatinine urine ratio - CBC with Differential/Platelet - COMPLETE METABOLIC PANEL WITH GFR - Magnesium - TSH   3. Hyperlipidemia associated with type 2 diabetes mellitus ([place])  - EKG 12-Lead - [place], RETROPERITNL ABD,  LTD - Lipid panel - TSH  4. Type 2 diabetes mellitus with stage 2 chronic kidney disease, without long-term current use of insulin ([facility])  - EKG 12-Lead - [place], RETROPERITNL ABD,  LTD - Urinalysis, Routine w reflex microscopic - Microalbumin / creatinine urine ratio - HM DIABETES FOOT EXAM - LOW EXTREMITY NEUR EXAM DOCUM - Hemoglobin A1c - Insulin, random  5. Vitamin D deficiency  - VITAMIN D 25 Hydroxyl  6. Attention deficit hyperactivity disorder (ADHD), predominantly inattentive type   7. BPH with obstruction/lower urinary tract symptoms  - PSA  8. Screening for colorectal cancer  - POC Hemoccult Bld/Stl ll  9. Screening for ischemic heart disease  - EKG 12-Lead  10. FH: hypertension  - EKG 12-Lead - [place], RETROPERITNL ABD,  LTD  11. Former smoker  - EKG 12-Lead - [place], RETROPERITNL ABD,  LTD  12. Screening for AAA (aortic abdominal aneurysm)  - [place], RETROPERITNL ABD,  LTD  13. Prostate cancer screening  - PSA  14. Medication management  - Urinalysis, Routine w reflex microscopic - Microalbumin / creatinine urine ratio - CBC with Differential/Platelet - COMPLETE METABOLIC PANEL WITH GFR - Magnesium - Lipid panel - TSH - Hemoglobin A1c - Insulin, random - VITAMIN D 25 Hydroxyl           Patient was counseled in prudent diet, weight control to achieve/maintain BMI less than 25, BP monitoring, regular exercise and medications as discussed.  Discussed med effects and SE's. Routine screening labs and tests as requested with regular follow-up as recommended. Over 40 minutes of exam, counseling, chart review and high complex critical decision making was performed   Kirtland Bouchard, MD

## 2019-05-14 ENCOUNTER — Encounter: Payer: Self-pay | Admitting: Internal Medicine

## 2019-05-14 LAB — CBC WITH DIFFERENTIAL/PLATELET
Absolute Monocytes: 696 cells/uL (ref 200–950)
Basophils Absolute: 44 cells/uL (ref 0–200)
Basophils Relative: 0.5 %
Eosinophils Absolute: 157 cells/uL (ref 15–500)
Eosinophils Relative: 1.8 %
HCT: 43.7 % (ref 38.5–50.0)
Hemoglobin: 15.1 g/dL (ref 13.2–17.1)
Lymphs Abs: 1897 cells/uL (ref 850–3900)
MCH: 32.1 pg (ref 27.0–33.0)
MCHC: 34.6 g/dL (ref 32.0–36.0)
MCV: 92.8 fL (ref 80.0–100.0)
MPV: 10.2 fL (ref 7.5–12.5)
Monocytes Relative: 8 %
Neutro Abs: 5907 cells/uL (ref 1500–7800)
Neutrophils Relative %: 67.9 %
Platelets: 320 10*3/uL (ref 140–400)
RBC: 4.71 10*6/uL (ref 4.20–5.80)
RDW: 12.6 % (ref 11.0–15.0)
Total Lymphocyte: 21.8 %
WBC: 8.7 10*3/uL (ref 3.8–10.8)

## 2019-05-14 LAB — COMPLETE METABOLIC PANEL WITH GFR
AG Ratio: 1.6 (calc) (ref 1.0–2.5)
ALT: 14 U/L (ref 9–46)
AST: 13 U/L (ref 10–35)
Albumin: 4.4 g/dL (ref 3.6–5.1)
Alkaline phosphatase (APISO): 67 U/L (ref 35–144)
BUN: 20 mg/dL (ref 7–25)
CO2: 26 mmol/L (ref 20–32)
Calcium: 9.7 mg/dL (ref 8.6–10.3)
Chloride: 98 mmol/L (ref 98–110)
Creat: 1.13 mg/dL (ref 0.70–1.18)
GFR, Est African American: 76 mL/min/{1.73_m2} (ref >=60)
GFR, Est Non African American: 65 mL/min/{1.73_m2} (ref >=60)
Globulin: 2.7 g/dL (calc) (ref 1.9–3.7)
Glucose, Bld: 298 mg/dL — ABNORMAL HIGH (ref 65–99)
Potassium: 4.7 mmol/L (ref 3.5–5.3)
Sodium: 134 mmol/L — ABNORMAL LOW (ref 135–146)
Total Bilirubin: 0.4 mg/dL (ref 0.2–1.2)
Total Protein: 7.1 g/dL (ref 6.1–8.1)

## 2019-05-14 LAB — LIPID PANEL
Cholesterol: 162 mg/dL (ref <200)
HDL: 64 mg/dL (ref >=40)
LDL Cholesterol (Calc): 70 mg/dL (calc)
Non-HDL Cholesterol (Calc): 98 mg/dL (calc) (ref <130)
Total CHOL/HDL Ratio: 2.5 (calc) (ref <5.0)
Triglycerides: 222 mg/dL — ABNORMAL HIGH (ref <150)

## 2019-05-14 LAB — URINALYSIS, ROUTINE W REFLEX MICROSCOPIC
Bilirubin Urine: NEGATIVE
Hgb urine dipstick: NEGATIVE
Ketones, ur: NEGATIVE
Leukocytes,Ua: NEGATIVE
Nitrite: NEGATIVE
Protein, ur: NEGATIVE
Specific Gravity, Urine: 1.011 (ref 1.001–1.03)
pH: <=5 (ref 5.0–8.0)

## 2019-05-14 LAB — HEMOGLOBIN A1C
Hgb A1c MFr Bld: 8.4 % of total Hgb — ABNORMAL HIGH (ref <5.7)
Mean Plasma Glucose: 194 (calc)
eAG (mmol/L): 10.8 (calc)

## 2019-05-14 LAB — MICROALBUMIN / CREATININE URINE RATIO
Creatinine, Urine: 31 mg/dL (ref 20–320)
Microalb Creat Ratio: 26 mcg/mg creat (ref <30)
Microalb, Ur: 0.8 mg/dL

## 2019-05-14 LAB — PSA: PSA: 1.8 ng/mL (ref <=4.0)

## 2019-05-14 LAB — MAGNESIUM: Magnesium: 2 mg/dL (ref 1.5–2.5)

## 2019-05-14 LAB — INSULIN, RANDOM: Insulin: 18 u[IU]/mL

## 2019-05-14 LAB — TSH: TSH: 2.03 mIU/L (ref 0.40–4.50)

## 2019-05-14 LAB — VITAMIN D 25 HYDROXY (VIT D DEFICIENCY, FRACTURES): Vit D, 25-Hydroxy: 52 ng/mL (ref 30–100)

## 2019-05-24 ENCOUNTER — Other Ambulatory Visit: Payer: Self-pay | Admitting: Internal Medicine

## 2019-05-24 DIAGNOSIS — E782 Mixed hyperlipidemia: Secondary | ICD-10-CM

## 2019-05-24 MED ORDER — ROSUVASTATIN CALCIUM 20 MG PO TABS: ORAL_TABLET | ORAL | 3 refills | Status: DC

## 2019-06-04 ENCOUNTER — Ambulatory Visit: Payer: BC Managed Care – PPO | Admitting: *Deleted

## 2019-06-04 ENCOUNTER — Encounter: Payer: Self-pay | Admitting: Internal Medicine

## 2019-06-04 ENCOUNTER — Other Ambulatory Visit: Payer: Self-pay | Admitting: Internal Medicine

## 2019-06-04 ENCOUNTER — Other Ambulatory Visit: Payer: Self-pay

## 2019-06-04 VITALS — Temp 96.7°F | Ht 66.5 in | Wt 228.0 lb

## 2019-06-04 DIAGNOSIS — F900 Attention-deficit hyperactivity disorder, predominantly inattentive type: Secondary | ICD-10-CM

## 2019-06-04 DIAGNOSIS — Z86010 Personal history of colonic polyps: Secondary | ICD-10-CM

## 2019-06-04 MED ORDER — SUPREP BOWEL PREP KIT 17.5-3.13-1.6 GM/177ML PO SOLN: 1.0000 | Freq: Once | ORAL | 0 refills | Status: AC

## 2019-06-04 MED ORDER — METHYLPHENIDATE HCL ER (OSM) 36 MG PO TBCR: EXTENDED_RELEASE_TABLET | ORAL | 0 refills | Status: DC

## 2019-06-04 NOTE — Progress Notes (Addendum)
No egg or soy allergy known to patient  No issues with past sedation with any surgeries  or procedures, no intubation problems  No diet pills per patient No home 02 use per patient  No blood thinners per patient  Pt denies issues with constipation  No A fib or A flutter  EMMI information given PATIENT AWARE NOT TO USE CHEWING TOBACCO THE DAY OF THE PROCEDURE suprep coupon given( patient has BCBS, not a [facility] primary)  covid test scheduled at [facility] 06/15/2019 at 950  Due to the COVID-19 pandemic we are asking patients to follow these guidelines. Please only bring one care partner. Please be aware that your care partner may wait in the car in the parking lot or if they feel like they will be too hot to wait in the car, they may wait in the lobby on the 4th floor. All care partners are required to wear a mask the entire time (we do not have any that we can provide them), they need to practice social distancing, and we will do a Covid check for all patient's and care partners when you arrive. Also we will check their temperature and your temperature. If the care partner waits in their car they need to stay in the parking lot the entire time and we will call them on their cell phone when the patient is ready for discharge so they can bring the car to the front of the building. Also all patient's will need to wear a mask into building.

## 2019-06-15 ENCOUNTER — Other Ambulatory Visit: Payer: Self-pay | Admitting: Internal Medicine

## 2019-06-15 ENCOUNTER — Ambulatory Visit (INDEPENDENT_AMBULATORY_CARE_PROVIDER_SITE_OTHER): Payer: BC Managed Care – PPO

## 2019-06-15 DIAGNOSIS — Z1159 Encounter for screening for other viral diseases: Secondary | ICD-10-CM

## 2019-06-15 DIAGNOSIS — Z03818 Encounter for observation for suspected exposure to other biological agents ruled out: Secondary | ICD-10-CM | POA: Diagnosis not present

## 2019-06-16 LAB — SARS CORONAVIRUS 2 (TAT 6-24 HRS): SARS Coronavirus 2: NEGATIVE

## 2019-06-18 ENCOUNTER — Encounter: Payer: Self-pay | Admitting: Internal Medicine

## 2019-06-18 ENCOUNTER — Other Ambulatory Visit: Payer: Self-pay

## 2019-06-18 ENCOUNTER — Ambulatory Visit (AMBULATORY_SURGERY_CENTER): Payer: BC Managed Care – PPO | Admitting: Internal Medicine

## 2019-06-18 VITALS — BP 122/71 | HR 80 | Temp 97.9°F | Resp 15 | Ht 66.0 in | Wt 228.0 lb

## 2019-06-18 DIAGNOSIS — D128 Benign neoplasm of rectum: Secondary | ICD-10-CM

## 2019-06-18 DIAGNOSIS — D125 Benign neoplasm of sigmoid colon: Secondary | ICD-10-CM | POA: Diagnosis not present

## 2019-06-18 DIAGNOSIS — D124 Benign neoplasm of descending colon: Secondary | ICD-10-CM | POA: Diagnosis not present

## 2019-06-18 DIAGNOSIS — Z86010 Personal history of colonic polyps: Secondary | ICD-10-CM

## 2019-06-18 DIAGNOSIS — D122 Benign neoplasm of ascending colon: Secondary | ICD-10-CM

## 2019-06-18 DIAGNOSIS — D120 Benign neoplasm of cecum: Secondary | ICD-10-CM | POA: Diagnosis not present

## 2019-06-18 DIAGNOSIS — Z1211 Encounter for screening for malignant neoplasm of colon: Secondary | ICD-10-CM | POA: Diagnosis not present

## 2019-06-18 DIAGNOSIS — K621 Rectal polyp: Secondary | ICD-10-CM | POA: Diagnosis not present

## 2019-06-18 MED ORDER — SODIUM CHLORIDE 0.9 % IV SOLN: 500.0000 mL | INTRAVENOUS | Status: DC

## 2019-06-18 NOTE — Progress Notes (Signed)
Called to room to assist during endoscopic procedure.  Patient ID and intended procedure confirmed with present staff. Received instructions for my participation in the procedure from the performing physician.  

## 2019-06-18 NOTE — Patient Instructions (Signed)
Impression/Recommendations:  Polyp handout given to patient.  Repeat colonoscopy in 1 year for surveillance.  Resume previous diet. Continue present medications.  Await pathology results.  YOU HAD AN ENDOSCOPIC PROCEDURE TODAY AT [facility]:   Refer to the procedure report that was given to you for any specific questions about what was found during the examination.  If the procedure report does not answer your questions, please call your gastroenterologist to clarify.  If you requested that your care partner not be given the details of your procedure findings, then the procedure report has been included in a sealed envelope for you to review at your convenience later.  YOU SHOULD EXPECT: Some feelings of bloating in the abdomen. Passage of more gas than usual.  Walking can help get rid of the air that was put into your GI tract during the procedure and reduce the bloating. If you had a lower endoscopy (such as a colonoscopy or flexible sigmoidoscopy) you may notice spotting of blood in your stool or on the toilet paper. If you underwent a bowel prep for your procedure, you may not have a normal bowel movement for a few days.  Please Note:  You might notice some irritation and congestion in your nose or some drainage.  This is from the oxygen used during your procedure.  There is no need for concern and it should clear up in a day or so.  SYMPTOMS TO REPORT IMMEDIATELY:   Following lower endoscopy (colonoscopy or flexible sigmoidoscopy):  Excessive amounts of blood in the stool  Significant tenderness or worsening of abdominal pains  Swelling of the abdomen that is new, acute  Fever of 100F or higher  For urgent or emergent issues, a gastroenterologist can be reached at any hour by calling [phone].   DIET:  We do recommend a small meal at first, but then you may proceed to your regular diet.  Drink plenty of fluids but you should avoid alcoholic beverages for 24  hours.  ACTIVITY:  You should plan to take it easy for the rest of today and you should NOT DRIVE or use heavy machinery until tomorrow (because of the sedation medicines used during the test).    FOLLOW UP: Our staff will call the number listed on your records 48-72 hours following your procedure to check on you and address any questions or concerns that you may have regarding the information given to you following your procedure. If we do not reach you, we will leave a message.  We will attempt to reach you two times.  During this call, we will ask if you have developed any symptoms of COVID 19. If you develop any symptoms (ie: fever, flu-like symptoms, shortness of breath, cough etc.) before then, please call [phone].  If you test positive for Covid 19 in the 2 weeks post procedure, please call and report this information to [place].    If any biopsies were taken you will be contacted by phone or by letter within the next 1-3 weeks.  Please call us at [phone] if you have not heard about the biopsies in 3 weeks.    SIGNATURES/CONFIDENTIALITY: You and/or your care partner have signed paperwork which will be entered into your electronic medical record.  These signatures attest to the fact that that the information above on your After Visit Summary has been reviewed and is understood.  Full responsibility of the confidentiality of this discharge information lies with you and/or your care-partner.

## 2019-06-18 NOTE — Progress Notes (Signed)
Temp JB  v/s Rincon I have reviewed the patient's medical history in detail and updated the computerized patient record.

## 2019-06-18 NOTE — Op Note (Signed)
[facility] Patient Name: Joseph Campbell Procedure Date: 06/18/2019 12:05 PM MRN: [id number] Endoscopist: Docia Chuck. Henrene Pastor , MD Age: 68 Referring MD:  Date of Birth: Dec 05, 1950 Gender: Male Account #: [id number] Procedure:                Colonoscopy with cold snare polypectomy x 13 Indications:              High risk colon cancer surveillance: Personal                            history of adenoma (10 mm or greater in size), High                            risk colon cancer surveillance: Personal history of                            multiple (3 or more) adenomas. Previous                            examinations 2003 and 2008. Well overdue for                            follow-up Medicines:                Monitored Anesthesia Care Procedure:                Pre-Anesthesia Assessment:                           - Prior to the procedure, a History and Physical                            was performed, and patient medications and                            allergies were reviewed. The patient's tolerance of                            previous anesthesia was also reviewed. The risks                            and benefits of the procedure and the sedation                            options and risks were discussed with the patient.                            All questions were answered, and informed consent                            was obtained. Prior Anticoagulants: The patient has                            taken no previous anticoagulant or antiplatelet  agents. ASA Grade Assessment: II - A patient with                            mild systemic disease. After reviewing the risks                            and benefits, the patient was deemed in                            satisfactory condition to undergo the procedure.                           After obtaining informed consent, the colonoscope                            was passed under direct vision.  Throughout the                            procedure, the patient's blood pressure, pulse, and                            oxygen saturations were monitored continuously. The                            Colonoscope was introduced through the anus and                            advanced to the the cecum, identified by                            appendiceal orifice and ileocecal valve. The                            ileocecal valve, appendiceal orifice, and rectum                            were photographed. The quality of the bowel                            preparation was excellent. The colonoscopy was                            performed without difficulty. The patient tolerated                            the procedure well. The bowel preparation used was                            SUPREP via split dose instruction. Scope In: 12:14:41 PM Scope Out: 12:36:32 PM Scope Withdrawal Time: 0 hours 19 minutes 41 seconds  Total Procedure Duration: 0 hours 21 minutes 51 seconds  Findings:                 13 polyps were found in the rectum, sigmoid colon,  descending colon, ascending colon and cecum. The                            polyps were 1 to 8 mm in size. These polyps were                            removed with a cold snare. Resection and retrieval                            were complete.                           The exam was otherwise without abnormality on                            direct and retroflexion views. Complications:            No immediate complications. Estimated blood loss:                            None. Estimated Blood Loss:     Estimated blood loss: none. Impression:               - Thirteen 1 to 8 mm polyps in the rectum, in the                            sigmoid colon, in the descending colon, in the                            ascending colon and in the cecum, removed with a                            cold snare. Resected and retrieved.                            - The examination was otherwise normal on direct                            and retroflexion views. Recommendation:           - Repeat colonoscopy in 1 year for surveillance.                           - Patient has a contact number available for                            emergencies. The signs and symptoms of potential                            delayed complications were discussed with the                            patient. Return to normal activities tomorrow.  Written discharge instructions were provided to the                            patient.                           - Resume previous diet.                           - Continue present medications.                           - Await pathology results. Docia Chuck. Henrene Pastor, MD 06/18/2019 12:48:40 PM This report has been signed electronically.

## 2019-06-18 NOTE — Progress Notes (Signed)
Report to PACU, RN, vss, BBS= Clear.  

## 2019-06-22 ENCOUNTER — Telehealth: Payer: Self-pay | Admitting: *Deleted

## 2019-06-22 ENCOUNTER — Encounter: Payer: Self-pay | Admitting: Internal Medicine

## 2019-06-22 NOTE — Telephone Encounter (Signed)
  Follow up Call-  Call back number 06/18/2019  Post procedure Call Back phone  # [phone]  Permission to leave phone message Yes  Some recent data might be hidden     Patient questions:  Do you have a fever, pain , or abdominal swelling? No. Pain Score  0 *  Have you tolerated food without any problems? Yes.    Have you been able to return to your normal activities? Yes.    Do you have any questions about your discharge instructions: Diet   No. Medications  No. Follow up visit  No.  Do you have questions or concerns about your Care? No.  Actions: * If pain score is 4 or above: No action needed, pain <4.  1. Have you developed a fever since your procedure? no  2.   Have you had an respiratory symptoms (SOB or cough) since your procedure? no  3.   Have you tested positive for COVID 19 since your procedure no  4.   Have you had any family members/close contacts diagnosed with the COVID 19 since your procedure?  no   If yes to any of these questions please route to Joylene John, RN and Alphonsa Gin, Therapist, sports.

## 2019-08-28 NOTE — Progress Notes (Signed)
FOLLOW UP  Assessment and Plan:   Hypertension Well controlled with current medications  Monitor blood pressure at home; patient to call if consistently greater than 130/80 Continue DASH diet.   Reminder to go to the ER if any CP, SOB, nausea, dizziness, severe HA, changes vision/speech, left arm numbness and tingling and jaw pain.  Cholesterol Currently above goal; increase to rosuvastatin 20 mg daily  Continue low cholesterol diet and exercise.  Check lipid panel.   Diabetes with diabetic chronic kidney disease Continue medication: metformin, glipizide - Continue with diet/exercise.  Continue diet and exercise.  Perform daily foot/skin check, notify office of any concerning changes.  Check A1C Check sugars around 3-5 if he feels funny, see if low sugar  Obesity with co morbidities Long discussion about weight loss, diet, and exercise Recommended diet heavy in fruits and veggies and low in animal meats, cheeses, and dairy products, appropriate calorie intake Discussed ideal weight for height Patient will work on increasing exercise, watching diet - increase vegetables Will follow up in 3 months  Vitamin D Def At goal at last visit; continue supplementation to maintain goal of 70-100  ADD Continue medications Helps with focus, no AE's. The patient was counseled on the addictive nature of the medication and was encouraged to take drug holidays when not needed.    Continue diet and meds as discussed. Further disposition pending results of labs. Discussed med's effects and SE's.   Over 30 minutes of exam, counseling, chart review, and critical decision making was performed.   Future Appointments  Date Time Provider [place]  08/31/2019  8:45 AM Vicie Mutters, PA-C [facility] None  12/01/2019  9:30 AM Unk Pinto, MD [facility] None  06/15/2020  3:00 PM Unk Pinto, MD [facility] None     ----------------------------------------------------------------------------------------------------------------------  HPI 69 y.o. male  presents for 3 month follow up on hypertension, cholesterol, diabetes, morbid obesity, ADD and vitamin D deficiency.   Patient is on an ADD medication (concerta 36 mg x 2 tabs) he states that the medication is helping and he denies any adverse reactions. He currently takes daily - cannot focus otherwise, helps keep him on track and productive.   BMI is Body mass index is 36.32 kg/m., he has been working on diet and exercise - he is doing bow flex and ellipitical.  Wt Readings from Last 3 Encounters:  08/31/19 225 lb (102.1 kg)  06/18/19 228 lb (103.4 kg)  06/04/19 228 lb (103.4 kg)   His blood pressure has been controlled at home, today their BP is BP: 122/70  He does workout. He denies chest pain, shortness of breath, dizziness.   He is on cholesterol medication (crestor 20 mg daily) and denies myalgias. His cholesterol is not at goal of less than 70. The cholesterol last visit was:   Lab Results  Component Value Date   CHOL 162 05/13/2019   HDL 64 05/13/2019   LDLCALC 70 05/13/2019   TRIG 222 (H) 05/13/2019   CHOLHDL 2.5 05/13/2019    He has been working on diet and exercise for T2 diabetes (on metformin and glipizide 5 mg BID) , and denies foot ulcerations, increased appetite, nausea, paresthesia of the feet, polydipsia, polyuria, visual disturbances, vomiting and weight loss. He does currently check sugars runs 120s in the mornings, he will occ get a low sugar in the morning if he only eats nuts and not his normal biscuit. Will have some diarrhea with metformin Last A1C in the office was:  Lab  Results  Component Value Date   HGBA1C 8.4 (H) 05/13/2019   Patient is on Vitamin D supplement, on 50,000 4 x a day and at goal at recent check:    Lab Results  Component Value Date   VD25OH 52 05/13/2019        Current Medications:  Current  Outpatient Medications on File Prior to Visit  Medication Sig  . aspirin 81 MG chewable tablet Chew by mouth daily.  . blood glucose meter kit and supplies KIT Dispense based on patient's  insurance preference, [employer] Elite-E11.22  . FREESTYLE LITE test strip CHECK GLUCOSE THREE TIMES DAILY  . glipiZIDE (GLUCOTROL) 5 MG tablet TAKE 1 TABLET BY MOUTH THREE TIMES DAILY WITH FOOD  . ketoconazole (NIZORAL) 2 % cream Apply 1 application topically 2 (two) times daily.  . Lancets (FREESTYLE) lancets TEST BLOOD SUGAR THREE TIMES DAILY  . metFORMIN (GLUCOPHAGE-XR) 500 MG 24 hr tablet Take 2 tablets 2 x /day with Meals for Diabetes  . methylphenidate (CONCERTA) 36 MG PO CR tablet Take 1 to 2 tablets daily if needed for alertness.  More effective if not take daily.  Marland Kitchen olmesartan (BENICAR) 40 MG tablet Take 40 mg by mouth 2 (two) times daily.  . rosuvastatin (CRESTOR) 20 MG tablet Take 1 tablet Daily for Cholesterol  . sildenafil (VIAGRA) 100 MG tablet Take 1 tablet (100 mg total) by mouth as needed for erectile dysfunction.   No current facility-administered medications on file prior to visit.     Allergies:  Allergies  Allergen Reactions  . Adderall [Amphetamine-Dextroamphet Er]     MOOD CHANGE  . Lipitor [Atorvastatin]     MYALGIA  . Other     FLU VACCINE  . Wellbutrin [Bupropion]     HA  . Zoloft [Sertraline Hcl]     AGGITATION     Medical History:  Past Medical History:  Diagnosis Date  . ADD (attention deficit disorder)   . Anxiety   . Depression   . Diabetes mellitus without complication ([place])   . Hyperlipidemia   . Hypertension   . Unspecified vitamin D deficiency    Family history- Reviewed and unchanged Social history- Reviewed and unchanged   Review of Systems:  Review of Systems  Constitutional: Negative for malaise/fatigue and weight loss.  HENT: Negative for hearing loss and tinnitus.   Eyes: Negative for blurred vision and double vision.  Respiratory:  Negative for cough, shortness of breath and wheezing.   Cardiovascular: Negative for chest pain, palpitations, orthopnea, claudication and leg swelling.  Gastrointestinal: Negative for abdominal pain, blood in stool, constipation, diarrhea, heartburn, melena, nausea and vomiting.  Genitourinary: Negative.   Musculoskeletal: Negative for joint pain and myalgias.  Skin: Negative for rash.  Neurological: Negative for dizziness, tingling, sensory change, weakness and headaches.  Endo/Heme/Allergies: Negative for polydipsia.  Psychiatric/Behavioral: Negative.   All other systems reviewed and are negative.   Physical Exam: BP 122/70   Pulse (!) 125   Temp 97.9 F (36.6 C) (Temporal)   Resp 14   Wt 225 lb (102.1 kg)   SpO2 96%   BMI 36.32 kg/m  Wt Readings from Last 3 Encounters:  08/31/19 225 lb (102.1 kg)  06/18/19 228 lb (103.4 kg)  06/04/19 228 lb (103.4 kg)   General Appearance: Well nourished, in no apparent distress. Eyes: PERRLA, EOMs, conjunctiva no swelling or erythema Sinuses: No Frontal/maxillary tenderness ENT/Mouth: Ext aud canals clear, TMs without erythema, bulging. Mouth and nose not examined- patient wearing a facemask  Hearing normal.  Neck: Supple, thyroid normal.  Respiratory: Respiratory effort normal, BS equal bilaterally without rales, rhonchi, wheezing or stridor.  Cardio: RRR with no MRGs. Brisk peripheral pulses without edema.  Abdomen: Soft, + BS. Obese Non tender, no guarding, rebound, hernias, masses. Lymphatics: Non tender without lymphadenopathy.  Musculoskeletal: Full ROM, 5/5 strength, Normal gait Skin: Warm, dry without rashes, lesions, ecchymosis.  Neuro: Cranial nerves intact. No cerebellar symptoms.  Psych: Awake and oriented X 3, normal affect, Insight and Judgment appropriate.    Vicie Mutters, PA-C 8:26 AM [facility]

## 2019-08-31 ENCOUNTER — Ambulatory Visit: Payer: BC Managed Care – PPO | Admitting: Adult Health

## 2019-08-31 ENCOUNTER — Ambulatory Visit: Payer: BC Managed Care – PPO | Admitting: Physician Assistant

## 2019-08-31 ENCOUNTER — Encounter: Payer: Self-pay | Admitting: Physician Assistant

## 2019-08-31 ENCOUNTER — Other Ambulatory Visit: Payer: Self-pay

## 2019-08-31 VITALS — BP 122/70 | HR 125 | Temp 97.9°F | Resp 14 | Wt 225.0 lb

## 2019-08-31 DIAGNOSIS — I10 Essential (primary) hypertension: Secondary | ICD-10-CM | POA: Diagnosis not present

## 2019-08-31 DIAGNOSIS — E1169 Type 2 diabetes mellitus with other specified complication: Secondary | ICD-10-CM | POA: Diagnosis not present

## 2019-08-31 DIAGNOSIS — E559 Vitamin D deficiency, unspecified: Secondary | ICD-10-CM | POA: Diagnosis not present

## 2019-08-31 DIAGNOSIS — N182 Chronic kidney disease, stage 2 (mild): Secondary | ICD-10-CM

## 2019-08-31 DIAGNOSIS — F900 Attention-deficit hyperactivity disorder, predominantly inattentive type: Secondary | ICD-10-CM

## 2019-08-31 DIAGNOSIS — Z79899 Other long term (current) drug therapy: Secondary | ICD-10-CM | POA: Diagnosis not present

## 2019-08-31 DIAGNOSIS — F325 Major depressive disorder, single episode, in full remission: Secondary | ICD-10-CM

## 2019-08-31 DIAGNOSIS — E1122 Type 2 diabetes mellitus with diabetic chronic kidney disease: Secondary | ICD-10-CM

## 2019-08-31 DIAGNOSIS — E785 Hyperlipidemia, unspecified: Secondary | ICD-10-CM

## 2019-08-31 MED ORDER — SILDENAFIL CITRATE 100 MG PO TABS: 100.0000 mg | ORAL_TABLET | ORAL | 1 refills | Status: DC | PRN

## 2019-08-31 MED ORDER — KETOCONAZOLE 2 % EX CREA: 1.0000 "application " | TOPICAL_CREAM | Freq: Two times a day (BID) | CUTANEOUS | 0 refills | Status: DC

## 2019-08-31 MED ORDER — VITAMIN D (ERGOCALCIFEROL) 1.25 MG (50000 UNIT) PO CAPS: ORAL_CAPSULE | ORAL | 1 refills | Status: DC

## 2019-08-31 MED ORDER — METHYLPHENIDATE HCL ER (OSM) 36 MG PO TBCR: EXTENDED_RELEASE_TABLET | ORAL | 0 refills | Status: DC

## 2019-08-31 NOTE — Patient Instructions (Signed)
General eating tips  What to Avoid . Avoid added sugars o Often added sugar can be found in processed foods such as many condiments, dry cereals, cakes, cookies, chips, crisps, crackers, candies, sweetened drinks, etc.  o Read labels and AVOID/DECREASE use of foods with the following in their ingredient list: Sugar, fructose, high fructose corn syrup, sucrose, glucose, maltose, dextrose, molasses, cane sugar, brown sugar, any type of syrup, agave nectar, etc.   . Avoid snacking in between meals- drink water or if you feel you need a snack, pick a high water content snack such as cucumbers, watermelon, or any veggie.  Marland Kitchen Avoid foods made with flour o If you are going to eat food made with flour, choose those made with whole-grains; and, minimize your consumption as much as is tolerable . Avoid processed foods o These foods are generally stocked in the middle of the grocery store.  o Focus on shopping on the perimeter of the grocery.  What to Include . Vegetables o GREEN LEAFY VEGETABLES: Kale, spinach, mustard greens, collard greens, cabbage, broccoli, etc. o OTHER: Asparagus, cauliflower, eggplant, carrots, peas, Brussel sprouts, tomatoes, bell peppers, zucchini, beets, cucumbers, etc. . Grains, seeds, and legumes o Beans: kidney beans, black eyed peas, garbanzo beans, black beans, pinto beans, etc. o Whole, unrefined grains: brown rice, barley, bulgur, oatmeal, etc. . Healthy fats  o Avoid highly processed fats such as vegetable oil o Examples of healthy fats: avocado, olives, virgin olive oil, dark chocolate (?72% Cocoa), nuts (peanuts, almonds, walnuts, cashews, pecans, etc.) o Please still do small amount of these healthy fats, they are dense in calories.  . Low - Moderate Intake of Animal Sources of Protein o Meat sources: chicken, [place], salmon, tuna. Limit to 4 ounces of meat at one time or the size of your palm. o Consider limiting dairy sources, but when choosing dairy focus on:  PLAIN [place] yogurt, cottage cheese, high-protein milk . Fruit o Choose berries   Diabetes or even increased sugars put you at 300% increased risk of heart attack and stroke.  ALSO BEING DIABETIC YOU MAY NOT HAVE ANY PAIN WITH A HEART ATTACK.  Even worse of a chance of no pain if you are a woman.  It is very unlikely that you will have any pain with a heart attack. Likely your symptoms will be very subtle, even for very severe disease.  Your symptoms for a heart attack will likely occur when you exert your self or exercise and include: Shortness of breath Sweating Nausea Dizziness Fast or irregular heart beats Fatigue   It makes me feel better if my diabetics get their heart rate up with exercise once or twice a week and pay close attention to your body. If there is ANY change in your exercise capacity or if you have symptoms above, please STOP and call 911 or call to come to the office.   PLEASE REMEMBER:  Diabetes is preventable! Up to 35 percent of complications and morbidities among individuals with type 2 diabetes can be prevented, delayed, or effectively treated and minimized with regular visits to a health professional, appropriate monitoring and medication, and a healthy diet and lifestyle.   Here is some information to help you keep your heart healthy: Move it! - Aim for 30 mins of activity every day. Take it slowly at first. Talk to [place] before starting any new exercise program.   Lose it.  -Body Mass Index (BMI) can indicate if you need to lose weight. A healthy  range is 18.5-24.9. For a BMI calculator, go to [employer]  Waist Management -Excess abdominal fat is a risk factor for heart disease, diabetes, asthma, stroke and more. Ideal waist circumference is less than 35" for women and less than 40" for men.   Eat Right -focus on fruits, vegetables, whole grains, and meals you make yourself. Avoid foods with trans fat and high sugar/sodium content.   Snooze or Snore? -  Loud snoring can be a sign of sleep apnea, a significant risk factor for high blood pressure, heart attach, stroke, and heart arrhythmias.  Kick the habit -Quit Smoking! Avoid second hand smoke. A single cigarette raises your blood pressure for 20 mins and increases the risk of heart attack and stroke for the next 24 hours.   Are Aspirin and Supplements right for you? -Add ENTERIC COATED low dose 81 mg Aspirin daily OR can do every other day if you have easy bruising to protect your heart and head. As well as to reduce risk of Colon Cancer by 20 %, Skin Cancer by 26 % , Melanoma by 46% and Pancreatic cancer by 60%  Say "No to Stress -There may be little you can do about problems that cause stress. However, techniques such as long walks, meditation, and exercise can help you manage it.   Start Now! - Make changes one at a time and set reasonable goals to increase your likelihood of success.

## 2019-09-01 LAB — CBC WITH DIFFERENTIAL/PLATELET
Absolute Monocytes: 695 cells/uL (ref 200–950)
Basophils Absolute: 40 cells/uL (ref 0–200)
Basophils Relative: 0.5 %
Eosinophils Absolute: 103 cells/uL (ref 15–500)
Eosinophils Relative: 1.3 %
HCT: 46.7 % (ref 38.5–50.0)
Hemoglobin: 15.9 g/dL (ref 13.2–17.1)
Lymphs Abs: 1951 cells/uL (ref 850–3900)
MCH: 30.6 pg (ref 27.0–33.0)
MCHC: 34 g/dL (ref 32.0–36.0)
MCV: 90 fL (ref 80.0–100.0)
MPV: 10.2 fL (ref 7.5–12.5)
Monocytes Relative: 8.8 %
Neutro Abs: 5111 cells/uL (ref 1500–7800)
Neutrophils Relative %: 64.7 %
Platelets: 307 10*3/uL (ref 140–400)
RBC: 5.19 10*6/uL (ref 4.20–5.80)
RDW: 12 % (ref 11.0–15.0)
Total Lymphocyte: 24.7 %
WBC: 7.9 10*3/uL (ref 3.8–10.8)

## 2019-09-01 LAB — COMPLETE METABOLIC PANEL WITH GFR
AG Ratio: 1.7 (calc) (ref 1.0–2.5)
ALT: 11 U/L (ref 9–46)
AST: 10 U/L (ref 10–35)
Albumin: 4.5 g/dL (ref 3.6–5.1)
Alkaline phosphatase (APISO): 70 U/L (ref 35–144)
BUN/Creatinine Ratio: 13 (calc) (ref 6–22)
BUN: 16 mg/dL (ref 7–25)
CO2: 28 mmol/L (ref 20–32)
Calcium: 10.1 mg/dL (ref 8.6–10.3)
Chloride: 96 mmol/L — ABNORMAL LOW (ref 98–110)
Creat: 1.24 mg/dL — ABNORMAL HIGH (ref 0.70–1.18)
GFR, Est African American: 68 mL/min/{1.73_m2} (ref >=60)
GFR, Est Non African American: 59 mL/min/{1.73_m2} — ABNORMAL LOW (ref >=60)
Globulin: 2.6 g/dL (calc) (ref 1.9–3.7)
Glucose, Bld: 350 mg/dL — ABNORMAL HIGH (ref 65–99)
Potassium: 4.9 mmol/L (ref 3.5–5.3)
Sodium: 133 mmol/L — ABNORMAL LOW (ref 135–146)
Total Bilirubin: 0.4 mg/dL (ref 0.2–1.2)
Total Protein: 7.1 g/dL (ref 6.1–8.1)

## 2019-09-01 LAB — MAGNESIUM: Magnesium: 1.9 mg/dL (ref 1.5–2.5)

## 2019-09-01 LAB — LIPID PANEL
Cholesterol: 175 mg/dL (ref <200)
HDL: 63 mg/dL (ref >=40)
LDL Cholesterol (Calc): 78 mg/dL (calc)
Non-HDL Cholesterol (Calc): 112 mg/dL (calc) (ref <130)
Total CHOL/HDL Ratio: 2.8 (calc) (ref <5.0)
Triglycerides: 243 mg/dL — ABNORMAL HIGH (ref <150)

## 2019-09-01 LAB — TSH: TSH: 3.2 mIU/L (ref 0.40–4.50)

## 2019-09-01 LAB — HEMOGLOBIN A1C
Hgb A1c MFr Bld: 12.2 % of total Hgb — ABNORMAL HIGH (ref <5.7)
Mean Plasma Glucose: 303 (calc)
eAG (mmol/L): 16.8 (calc)

## 2019-09-01 LAB — VITAMIN D 25 HYDROXY (VIT D DEFICIENCY, FRACTURES): Vit D, 25-Hydroxy: 101 ng/mL — ABNORMAL HIGH (ref 30–100)

## 2019-11-30 ENCOUNTER — Encounter: Payer: Self-pay | Admitting: Internal Medicine

## 2019-11-30 NOTE — Progress Notes (Signed)
    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  Last A1c was not at goal:  Lab Results  Component Value Date   HGBA1C 12.2 (H) 08/31/2019           Further, the patient also has history of Vitamin D Deficiency ("26" / 2008)  and supplements vitamin D without any suspected side-effects. Last vitamin D was at goal:  Lab Results  Component Value Date   VD25OH 101 (H) 08/31/2019

## 2019-12-01 ENCOUNTER — Ambulatory Visit (INDEPENDENT_AMBULATORY_CARE_PROVIDER_SITE_OTHER): Payer: BC Managed Care – PPO | Admitting: Internal Medicine

## 2019-12-01 DIAGNOSIS — I10 Essential (primary) hypertension: Secondary | ICD-10-CM

## 2019-12-01 DIAGNOSIS — Z79899 Other long term (current) drug therapy: Secondary | ICD-10-CM

## 2019-12-01 DIAGNOSIS — E559 Vitamin D deficiency, unspecified: Secondary | ICD-10-CM

## 2019-12-01 DIAGNOSIS — Z5329 Procedure and treatment not carried out because of patient's decision for other reasons: Secondary | ICD-10-CM

## 2019-12-01 DIAGNOSIS — N1831 Chronic kidney disease, stage 3a: Secondary | ICD-10-CM

## 2019-12-01 DIAGNOSIS — E785 Hyperlipidemia, unspecified: Secondary | ICD-10-CM

## 2019-12-01 DIAGNOSIS — E1169 Type 2 diabetes mellitus with other specified complication: Secondary | ICD-10-CM

## 2019-12-01 DIAGNOSIS — E0821 Diabetes mellitus due to underlying condition with diabetic nephropathy: Secondary | ICD-10-CM

## 2019-12-16 ENCOUNTER — Other Ambulatory Visit: Payer: Self-pay | Admitting: Internal Medicine

## 2019-12-16 DIAGNOSIS — F900 Attention-deficit hyperactivity disorder, predominantly inattentive type: Secondary | ICD-10-CM

## 2019-12-16 MED ORDER — METHYLPHENIDATE HCL ER (OSM) 36 MG PO TBCR: EXTENDED_RELEASE_TABLET | ORAL | 0 refills | Status: DC

## 2020-01-07 NOTE — Patient Instructions (Signed)

## 2020-01-07 NOTE — Progress Notes (Signed)
History of Present Illness:       This very nice 69 y.o.  [name]  presents for 6 month follow up with HTN, HLD, Pre-Diabetes and Vitamin D Deficiency. Patient has inattentive ADD and uses Concerta with improved focus & concentration.      Patient is treated for HTN (1990's) & BP has been controlled at home. Today's BP is at goal - 126/82. Patient has had no complaints of any cardiac type chest pain, palpitations, dyspnea / orthopnea / PND, dizziness, claudication, or dependent edema.      Hyperlipidemia is controlled with diet & meds. Patient denies myalgias or other med SE's. 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   Patient "alleges" diet compliance and last A1c was not at goal:  Lab Results  Component Value Date   HGBA1C 12.2 (H) 08/31/2019           Further, the patient also has history of Vitamin D Deficiency ("26" / 2008)  and supplements vitamin D without any suspected side-effects. Last vitamin D was at goal:  Lab Results  Component Value Date   VD25OH 101 (H) 08/31/2019    Current Outpatient Medications on File Prior to Visit  Medication Sig  . aspirin 81 MG chewable tablet Chew by mouth daily.  . blood glucose meter kit and supplies KIT Dispense based on patient's  insurance preference, [employer] Elite-E11.22  . FREESTYLE LITE test strip CHECK GLUCOSE THREE TIMES DAILY  . glipiZIDE (GLUCOTROL) 5 MG tablet TAKE 1 TABLET BY MOUTH THREE TIMES DAILY WITH FOOD  . Lancets (FREESTYLE) lancets TEST BLOOD SUGAR THREE TIMES DAILY  . metFORMIN (GLUCOPHAGE-XR) 500 MG 24 hr tablet Take 2 tablets 2 x /day with Meals for  Diabetes  . methylphenidate (CONCERTA) 36 MG PO CR tablet Take 1 to 2 tablets daily if needed for alertness.  More effective if not take daily.  Marland Kitchen olmesartan (BENICAR) 40 MG tablet Take 40 mg by mouth 2 (two) times daily.  . rosuvastatin (CRESTOR) 20 MG tablet Take 1 tablet Daily for Cholesterol  . sildenafil (VIAGRA) 100 MG tablet Take 1 tablet (100 mg total) by mouth as needed for erectile dysfunction.  . Vitamin D, Ergocalciferol, (DRISDOL) 1.25 MG (50000 UNIT) CAPS capsule TAKE 1 CAPSULE BY MOUTH 4 TIMES EVERY WEEK   No current facility-administered medications on file prior to visit.    Allergies  Allergen Reactions  . Adderall [Amphetamine-Dextroamphet Er]     MOOD CHANGE  . Lipitor [Atorvastatin]     MYALGIA  . Other     FLU VACCINE  . Wellbutrin [Bupropion]     HA  . Zoloft [Sertraline Hcl]     AGGITATION    PMHx:   Past Medical History:  Diagnosis Date  . ADD (attention deficit disorder)   . Anxiety   . Depression   . Diabetes mellitus without complication ([place])   . Hyperlipidemia   . Hypertension   . Unspecified vitamin D deficiency     Immunization History  Administered Date(s) Administered  . DT (Pediatric) 09/16/2014  . PPD Test 09/16/2014,  10/28/2015  . Pneumococcal-Unspecified 07/31/1999  . Td 07/31/2003  . Zoster 05/26/2013    Past Surgical History:  Procedure Laterality Date  . COLONOSCOPY    . POLYPECTOMY      FHx:    Reviewed / unchanged  SHx:    Reviewed / unchanged   Systems Review:  Constitutional: Denies fever, chills, wt changes, headaches, insomnia, fatigue, night sweats, change in appetite. Eyes: Denies redness, blurred vision, diplopia, discharge, itchy, watery eyes.  ENT: Denies discharge, congestion, post nasal drip, epistaxis, sore throat, earache, hearing loss, dental pain, tinnitus, vertigo, sinus pain, snoring.  CV: Denies chest pain, palpitations, irregular heartbeat, syncope, dyspnea, diaphoresis, orthopnea, PND,  claudication or edema. Respiratory: denies cough, dyspnea, DOE, pleurisy, hoarseness, laryngitis, wheezing.  Gastrointestinal: Denies dysphagia, odynophagia, heartburn, reflux, water brash, abdominal pain or cramps, nausea, vomiting, bloating, diarrhea, constipation, hematemesis, melena, hematochezia  or hemorrhoids. Genitourinary: Denies dysuria, frequency, urgency, nocturia, hesitancy, discharge, hematuria or flank pain. Musculoskeletal: Denies arthralgias, myalgias, stiffness, jt. swelling, pain, limping or strain/sprain.  Skin: Denies pruritus, rash, hives, warts, acne, eczema or change in skin lesion(s). Neuro: No weakness, tremor, incoordination, spasms, paresthesia or pain. Psychiatric: Denies confusion, memory loss or sensory loss. Endo: Denies change in weight, skin or hair change.  Heme/Lymph: No excessive bleeding, bruising or enlarged lymph nodes.  Physical Exam  BP 126/82   Pulse 92   Temp (!) 97.2 F (36.2 C)   Resp 16   Ht 5' 6.5" (1.689 m)   Wt 217 lb (98.4 kg)   BMI 34.50 kg/m   Appears  Over nourished  and in no distress.  Eyes: PERRLA, EOMs, conjunctiva no swelling or erythema. Sinuses: No frontal/maxillary tenderness ENT/Mouth: EAC's clear, TM's nl w/o erythema, bulging. Nares clear w/o erythema, swelling, exudates. Oropharynx clear without erythema or exudates. Oral hygiene is good. Tongue normal, non obstructing. Hearing intact.  Neck: Supple. Thyroid not palpable. Car 2+/2+ without bruits, nodes or JVD. Chest: Respirations nl with BS clear & equal w/o rales, rhonchi, wheezing or stridor.  Cor: Heart sounds normal w/ regular rate and rhythm without sig. murmurs, gallops, clicks or rubs. Peripheral pulses normal and equal  without edema.  Abdomen: Soft, obese  & bowel sounds normal. Non-tender w/o guarding, rebound, hernias, masses or organomegaly.  Lymphatics: Unremarkable.  Musculoskeletal: Full ROM all peripheral extremities, joint stability, 5/5 strength and  normal gait.  Skin: Warm, dry without exposed rashes, lesions or ecchymosis apparent.  Neuro: Cranial nerves intact, reflexes equal bilaterally. Sensory-motor testing grossly intact. Tendon reflexes grossly intact.  Pysch: Alert & oriented x 3.  Insight and judgement nl & appropriate. No ideations.  Assessment and Plan:  1. Essential hypertension  - Continue medication, monitor blood pressure at home.  - Continue DASH diet.  Reminder to go to the ER if any CP,  SOB, nausea, dizziness, severe HA, changes vision/speech.  - CBC with Differential/Platelet - COMPLETE METABOLIC PANEL WITH GFR - Magnesium - TSH  2. Hyperlipidemia associated with type 2 diabetes mellitus ([place])  - Continue diet/meds, exercise,& lifestyle modifications.  - Continue monitor periodic cholesterol/liver & renal functions   - Lipid panel - TSH  3. Diabetes mellitus due to underlying condition with stage 3a  chronic kidney disease, without long-term current use of insulin ([facility])  - Continue diet, exercise  - Lifestyle modifications.  - Monitor appropriate labs.  - Hemoglobin A1c - Insulin, random  4. Vitamin D deficiency  - Continue supplementation.  - VITAMIN D 25 Hydroxy   5. Medication management  -  CBC with Differential/Platelet - COMPLETE METABOLIC PANEL WITH GFR - Magnesium - Lipid panel - TSH - Hemoglobin A1c - Insulin, random - VITAMIN D 25 Hydroxy        Discussed  regular exercise, BP monitoring, weight control to achieve/maintain BMI less than 25 and discussed med and SE's. Recommended labs to assess and monitor clinical status with further disposition pending results of labs.  I discussed the assessment and treatment plan with the patient. The patient was provided an opportunity to ask questions and all were answered. The patient agreed with the plan and demonstrated an understanding of the instructions.  I provided over 30 minutes of exam, counseling, chart review and  complex  critical decision making.   Kirtland Bouchard, MD

## 2020-01-08 ENCOUNTER — Ambulatory Visit: Payer: BC Managed Care – PPO | Admitting: Internal Medicine

## 2020-01-08 ENCOUNTER — Other Ambulatory Visit: Payer: Self-pay

## 2020-01-08 ENCOUNTER — Encounter: Payer: Self-pay | Admitting: Internal Medicine

## 2020-01-08 ENCOUNTER — Other Ambulatory Visit: Payer: Self-pay | Admitting: *Deleted

## 2020-01-08 ENCOUNTER — Other Ambulatory Visit: Payer: Self-pay | Admitting: Internal Medicine

## 2020-01-08 VITALS — BP 126/82 | HR 92 | Temp 97.2°F | Resp 16 | Ht 66.5 in | Wt 217.0 lb

## 2020-01-08 DIAGNOSIS — I10 Essential (primary) hypertension: Secondary | ICD-10-CM | POA: Diagnosis not present

## 2020-01-08 DIAGNOSIS — E349 Endocrine disorder, unspecified: Secondary | ICD-10-CM

## 2020-01-08 DIAGNOSIS — Z79899 Other long term (current) drug therapy: Secondary | ICD-10-CM | POA: Diagnosis not present

## 2020-01-08 DIAGNOSIS — E1169 Type 2 diabetes mellitus with other specified complication: Secondary | ICD-10-CM | POA: Diagnosis not present

## 2020-01-08 DIAGNOSIS — E559 Vitamin D deficiency, unspecified: Secondary | ICD-10-CM

## 2020-01-08 DIAGNOSIS — E785 Hyperlipidemia, unspecified: Secondary | ICD-10-CM | POA: Diagnosis not present

## 2020-01-08 DIAGNOSIS — N182 Chronic kidney disease, stage 2 (mild): Secondary | ICD-10-CM

## 2020-01-08 DIAGNOSIS — E0821 Diabetes mellitus due to underlying condition with diabetic nephropathy: Secondary | ICD-10-CM

## 2020-01-08 DIAGNOSIS — E1122 Type 2 diabetes mellitus with diabetic chronic kidney disease: Secondary | ICD-10-CM

## 2020-01-08 DIAGNOSIS — N1831 Chronic kidney disease, stage 3a: Secondary | ICD-10-CM

## 2020-01-08 MED ORDER — KETOCONAZOLE 2 % EX CREA: 1.0000 "application " | TOPICAL_CREAM | Freq: Two times a day (BID) | CUTANEOUS | 0 refills | Status: AC

## 2020-01-08 MED ORDER — TADALAFIL 20 MG PO TABS: ORAL_TABLET | ORAL | 2 refills | Status: DC

## 2020-01-09 ENCOUNTER — Other Ambulatory Visit: Payer: Self-pay | Admitting: Internal Medicine

## 2020-01-09 LAB — TESTOSTERONE: Testosterone: 444 ng/dL (ref 250–827)

## 2020-01-09 MED ORDER — DAPAGLIFLOZIN PROPANEDIOL 10 MG PO TABS: ORAL_TABLET | ORAL | 1 refills | Status: DC

## 2020-01-09 NOTE — Progress Notes (Signed)
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   (usually A1c has to be below 9.0% to pass the CDL exam) =============================================================  -  Chol = 199 & LDL = 95 - Both OK =============================================================  -  Also Vit D is 125 - too high - goal range is between 60-100)  - So stop your Vitamin D 50,000 & throw away  - Leave off Vitamin D for 10 days,  - Then restart & ONLY take Vitamin D 5,000 unit capsules & take   - Vit D 5,000 unit cap x 2 caps = 10,000 units Daily - every day =============================================================  -  All Else - CBC - Kidneys - Electrolytes -  Liver - Magnesium & Thyroid  - all  Normal / OK =============================================================  -  So  please schedule an Office Visit in about 7-10 days to review you blood sugar list  =============================================================  -  this info is shared with patient on Sat  01/09/2020 at 8:15 am =============================================================

## 2020-01-11 LAB — CBC WITH DIFFERENTIAL/PLATELET
Absolute Monocytes: 712 cells/uL (ref 200–950)
Basophils Absolute: 40 cells/uL (ref 0–200)
Basophils Relative: 0.5 %
Eosinophils Absolute: 152 cells/uL (ref 15–500)
Eosinophils Relative: 1.9 %
HCT: 46.7 % (ref 38.5–50.0)
Hemoglobin: 15.7 g/dL (ref 13.2–17.1)
Lymphs Abs: 2056 cells/uL (ref 850–3900)
MCH: 31.3 pg (ref 27.0–33.0)
MCHC: 33.6 g/dL (ref 32.0–36.0)
MCV: 93 fL (ref 80.0–100.0)
MPV: 10.6 fL (ref 7.5–12.5)
Monocytes Relative: 8.9 %
Neutro Abs: 5040 cells/uL (ref 1500–7800)
Neutrophils Relative %: 63 %
Platelets: 306 10*3/uL (ref 140–400)
RBC: 5.02 10*6/uL (ref 4.20–5.80)
RDW: 11.8 % (ref 11.0–15.0)
Total Lymphocyte: 25.7 %
WBC: 8 10*3/uL (ref 3.8–10.8)

## 2020-01-11 LAB — LIPID PANEL
Cholesterol: 199 mg/dL (ref <200)
HDL: 77 mg/dL (ref >=40)
LDL Cholesterol (Calc): 95 mg/dL (calc)
Non-HDL Cholesterol (Calc): 122 mg/dL (calc) (ref <130)
Total CHOL/HDL Ratio: 2.6 (calc) (ref <5.0)
Triglycerides: 167 mg/dL — ABNORMAL HIGH (ref <150)

## 2020-01-11 LAB — HEMOGLOBIN A1C
Hgb A1c MFr Bld: 12.7 % of total Hgb — ABNORMAL HIGH (ref <5.7)
Mean Plasma Glucose: 318 (calc)
eAG (mmol/L): 17.6 (calc)

## 2020-01-11 LAB — COMPLETE METABOLIC PANEL WITH GFR
AG Ratio: 1.8 (calc) (ref 1.0–2.5)
ALT: 11 U/L (ref 9–46)
AST: 8 U/L — ABNORMAL LOW (ref 10–35)
Albumin: 4.6 g/dL (ref 3.6–5.1)
Alkaline phosphatase (APISO): 78 U/L (ref 35–144)
BUN/Creatinine Ratio: 14 (calc) (ref 6–22)
BUN: 20 mg/dL (ref 7–25)
CO2: 27 mmol/L (ref 20–32)
Calcium: 10.2 mg/dL (ref 8.6–10.3)
Chloride: 96 mmol/L — ABNORMAL LOW (ref 98–110)
Creat: 1.38 mg/dL — ABNORMAL HIGH (ref 0.70–1.18)
GFR, Est African American: 59 mL/min/{1.73_m2} — ABNORMAL LOW (ref >=60)
GFR, Est Non African American: 51 mL/min/{1.73_m2} — ABNORMAL LOW (ref >=60)
Globulin: 2.6 g/dL (calc) (ref 1.9–3.7)
Glucose, Bld: 502 mg/dL (ref 65–99)
Potassium: 4.9 mmol/L (ref 3.5–5.3)
Sodium: 133 mmol/L — ABNORMAL LOW (ref 135–146)
Total Bilirubin: 0.5 mg/dL (ref 0.2–1.2)
Total Protein: 7.2 g/dL (ref 6.1–8.1)

## 2020-01-11 LAB — TSH: TSH: 3.96 mIU/L (ref 0.40–4.50)

## 2020-01-11 LAB — MAGNESIUM: Magnesium: 1.9 mg/dL (ref 1.5–2.5)

## 2020-01-11 LAB — VITAMIN D 25 HYDROXY (VIT D DEFICIENCY, FRACTURES): Vit D, 25-Hydroxy: 125 ng/mL — ABNORMAL HIGH (ref 30–100)

## 2020-01-11 LAB — INSULIN, RANDOM: Insulin: 14.6 u[IU]/mL

## 2020-02-25 ENCOUNTER — Encounter: Payer: Self-pay | Admitting: Internal Medicine

## 2020-03-17 ENCOUNTER — Telehealth: Payer: Self-pay | Admitting: Internal Medicine

## 2020-03-17 ENCOUNTER — Other Ambulatory Visit: Payer: Self-pay | Admitting: Internal Medicine

## 2020-03-17 DIAGNOSIS — F900 Attention-deficit hyperactivity disorder, predominantly inattentive type: Secondary | ICD-10-CM

## 2020-03-17 MED ORDER — METHYLPHENIDATE HCL ER (OSM) 36 MG PO TBCR: EXTENDED_RELEASE_TABLET | ORAL | 0 refills | Status: DC

## 2020-03-17 NOTE — Telephone Encounter (Signed)
Patient is calling and asking for a refill of Concerta-  [id number], [place] [address] Phone: [phone] Fax: [facility] [place], [place] [address] [place] [address] Phone: [phone] Fax: [phone]    Recent Visits Date Type Provider Dept  01/08/20 Office Visit Unk Pinto, MD [facility]  08/31/19 Office Visit Vicie Mutters, PA-C [facility]  05/13/19 Office Visit Unk Pinto, MD [facility]  10/30/18 Office Visit Vicie Mutters, PA-C [place] recent visits within past 540 days with a meds authorizing provider and meeting all other requirements Future Appointments Date Type Provider Dept  04/14/20 Appointment Vicie Mutters, PA-C [facility]  06/15/20 Appointment Unk Pinto, MD [place] future appointments within next 150 days with a meds authorizing provider and meeting all other requirements

## 2020-04-06 ENCOUNTER — Other Ambulatory Visit: Payer: Self-pay | Admitting: Internal Medicine

## 2020-04-06 DIAGNOSIS — E1122 Type 2 diabetes mellitus with diabetic chronic kidney disease: Secondary | ICD-10-CM

## 2020-04-13 NOTE — Progress Notes (Signed)
FOLLOW UP  Assessment and Plan:   Hypertension Well controlled with current medications  Monitor blood pressure at home; patient to call if consistently greater than 130/80 Continue DASH diet.   Reminder to go to the ER if any CP, SOB, nausea, dizziness, severe HA, changes vision/speech, left arm numbness and tingling and jaw pain.  Cholesterol Currently above goal, will recheck, goal is 70, may add on zetia or increase cholesterol to 31m  Continue low cholesterol diet and exercise.  Check lipid panel.   Diabetes with diabetic chronic kidney disease Continue medication: metformin, glipizide 5MG TID and now farxiga 150m check labs- Patient has DOT in 1 month, if not better add on GLP Continue with diet/exercise.  Continue diet and exercise.  Perform daily foot/skin check, notify office of any concerning changes.  Check A1C  Obesity with co morbidities Long discussion about weight loss, diet, and exercise Recommended diet heavy in fruits and veggies and low in animal meats, cheeses, and dairy products, appropriate calorie intake Discussed ideal weight for height Patient will work on increasing exercise, watching diet - increase vegetables Will follow up in 3 months  Vitamin D Def At goal at last visit; continue supplementation to maintain goal of 70-100  ADD Continue medications Helps with focus, no AE's. The patient was counseled on the addictive nature of the medication and was encouraged to take drug holidays when not needed.    Continue diet and meds as discussed. Further disposition pending results of labs. Discussed med's effects and SE's.   Over 30 minutes of exam, counseling, chart review, and critical decision making was performed.   Future Appointments  Date Time Provider De[place]11/17/2021  3:00 PM McUnk PintoMD [facility] None     ----------------------------------------------------------------------------------------------------------------------  HPI [age].o. male  presents for 3 month follow up on hypertension, cholesterol, diabetes, morbid obesity, ADD and vitamin D deficiency.   Patient is on an ADD medication (concerta 36 mg x 2 tabs) he states that the medication is helping and he denies any adverse reactions.   BMI is Body mass index is 34.82 kg/m., he has been working on diet and exercise. Wt Readings from Last 3 Encounters:  04/14/20 219 lb (99.3 kg)  01/08/20 217 lb (98.4 kg)  08/31/19 225 lb (102.1 kg)   His blood pressure has been controlled at home, today their BP is BP: 126/74  He does workout. He denies chest pain, shortness of breath, dizziness.   He is on cholesterol medication (crestor 20 mg daily) and denies myalgias. His cholesterol is not at goal of less than 70. The cholesterol last visit was:   Lab Results  Component Value Date   CHOL 199 01/08/2020   HDL 77 01/08/2020   LDLCALC 95 01/08/2020   TRIG 167 (H) 01/08/2020   CHOLHDL 2.6 01/08/2020    He has been working on diet and exercise for T2 diabetes- has been increasing steadily since 08/2018 With CKD on ARB With hyperlipidemia not at goal of less than 70, on crestor 2057mn metformin and glipizide 5 mg TID, with farxiga 24m63mded last visit.   He is checking sugars 2-3 x a day, running 101-130's in the morning, at night sugars are in the 90's.  denies foot ulcerations, increased appetite, nausea, paresthesia of the feet, polydipsia, polyuria, visual disturbances, vomiting and weight loss.   Last A1C in the office was:  Lab Results  Component Value Date   HGBA1C 12.7 (H) 01/08/2020   Lab  Results  Component Value Date   GFRNONAA 51 (L) 01/08/2020   Patient is on Vitamin D supplement, on 50,000 4 x a week, over goal last visit Lab Results  Component Value Date   VD25OH 125 (H) 01/08/2020        Current Medications:    Current Outpatient Medications (Endocrine & Metabolic):  .  dapagliflozin propanediol (FARXIGA) 10 MG TABS tablet, Take 1 tablet Daily for Diabetes .  glipiZIDE (GLUCOTROL) 5 MG tablet, Take    1 tablet    3 x /day    with meals for Diabetes .  metFORMIN (GLUCOPHAGE-XR) 500 MG 24 hr tablet, Take 2 tablets 2 x /day with Meals for Diabetes  Current Outpatient Medications (Cardiovascular):  .  olmesartan (BENICAR) 40 MG tablet, Take 40 mg by mouth 2 (two) times daily. .  rosuvastatin (CRESTOR) 20 MG tablet, Take 1 tablet Daily for Cholesterol .  sildenafil (VIAGRA) 100 MG tablet, Take 1 tablet (100 mg total) by mouth as needed for erectile dysfunction. .  tadalafil (CIALIS) 20 MG tablet, Take 1/2 to 1 tablet every 2 to 3 days as needed for XXXX   Current Outpatient Medications (Analgesics):  .  aspirin 81 MG chewable tablet, Chew by mouth daily.   Current Outpatient Medications (Other):  .  blood glucose meter kit and supplies KIT, Dispense based on patient's  insurance preference, [employer] Elite-E11.22 .  FREESTYLE LITE test strip, CHECK GLUCOSE THREE TIMES DAILY .  ketoconazole (NIZORAL) 2 % cream, Apply 1 application topically 2 (two) times daily. .  Lancets (FREESTYLE) lancets, TEST BLOOD SUGAR THREE TIMES DAILY .  methylphenidate (CONCERTA) 36 MG PO CR tablet, Take 1 to 2 tablets Daily if needed for Alertness.  More effective if not take daily. .  Vitamin D, Ergocalciferol, (DRISDOL) 1.25 MG (50000 UNIT) CAPS capsule, TAKE 1 CAPSULE BY MOUTH 4 TIMES EVERY WEEK   Allergies:  Allergies  Allergen Reactions  . Adderall [Amphetamine-Dextroamphet Er]     MOOD CHANGE  . Lipitor [Atorvastatin]     MYALGIA  . Other     FLU VACCINE  . Wellbutrin [Bupropion]     HA  . Zoloft [Sertraline Hcl]     AGGITATION     Medical History:  Past Medical History:  Diagnosis Date  . ADD (attention deficit disorder)   . Anxiety   . Depression   . Diabetes mellitus without complication ([place])    . Hyperlipidemia   . Hypertension   . Unspecified vitamin D deficiency    Family history- Reviewed and unchanged Social history- Reviewed and unchanged   Review of Systems:  Review of Systems  Constitutional: Negative for malaise/fatigue and weight loss.  HENT: Negative for hearing loss and tinnitus.   Eyes: Negative for blurred vision and double vision.  Respiratory: Negative for cough, shortness of breath and wheezing.   Cardiovascular: Negative for chest pain, palpitations, orthopnea, claudication and leg swelling.  Gastrointestinal: Negative for abdominal pain, blood in stool, constipation, diarrhea, heartburn, melena, nausea and vomiting.  Genitourinary: Negative.   Musculoskeletal: Negative for joint pain and myalgias.  Skin: Negative for rash.  Neurological: Negative for dizziness, tingling, sensory change, weakness and headaches.  Endo/Heme/Allergies: Negative for polydipsia.  Psychiatric/Behavioral: Negative.   All other systems reviewed and are negative.   Physical Exam: BP 126/74   Pulse 90   Temp 97.6 F (36.4 C)   Wt 219 lb (99.3 kg)   SpO2 99%   BMI 34.82 kg/m  Wt Readings from Last  3 Encounters:  04/14/20 219 lb (99.3 kg)  01/08/20 217 lb (98.4 kg)  08/31/19 225 lb (102.1 kg)   General Appearance: Well nourished, in no apparent distress. Eyes: PERRLA, EOMs, conjunctiva no swelling or erythema Sinuses: No Frontal/maxillary tenderness ENT/Mouth: Ext aud canals clear, TMs without erythema, bulging. Mouth and nose not examined- patient wearing a facemask  Hearing normal.  Neck: Supple, thyroid normal.  Respiratory: Respiratory effort normal, BS equal bilaterally without rales, rhonchi, wheezing or stridor.  Cardio: RRR with no MRGs. Brisk peripheral pulses without edema.  Abdomen: Soft, + BS. Obese Non tender, no guarding, rebound, hernias, masses. Lymphatics: Non tender without lymphadenopathy.  Musculoskeletal: Full ROM, 5/5 strength, Normal gait Skin:  Warm, dry without rashes, lesions, ecchymosis.  Neuro: Cranial nerves intact. No cerebellar symptoms.  Psych: Awake and oriented X 3, normal affect, Insight and Judgment appropriate.    Vicie Mutters, PA-C 9:56 AM [facility]

## 2020-04-14 ENCOUNTER — Encounter: Payer: Self-pay | Admitting: Physician Assistant

## 2020-04-14 ENCOUNTER — Other Ambulatory Visit: Payer: Self-pay

## 2020-04-14 ENCOUNTER — Ambulatory Visit (INDEPENDENT_AMBULATORY_CARE_PROVIDER_SITE_OTHER): Payer: BC Managed Care – PPO | Admitting: Physician Assistant

## 2020-04-14 VITALS — BP 126/74 | HR 90 | Temp 97.6°F | Wt 219.0 lb

## 2020-04-14 DIAGNOSIS — E1169 Type 2 diabetes mellitus with other specified complication: Secondary | ICD-10-CM | POA: Diagnosis not present

## 2020-04-14 DIAGNOSIS — N182 Chronic kidney disease, stage 2 (mild): Secondary | ICD-10-CM

## 2020-04-14 DIAGNOSIS — E1122 Type 2 diabetes mellitus with diabetic chronic kidney disease: Secondary | ICD-10-CM | POA: Diagnosis not present

## 2020-04-14 DIAGNOSIS — F325 Major depressive disorder, single episode, in full remission: Secondary | ICD-10-CM | POA: Diagnosis not present

## 2020-04-14 DIAGNOSIS — E785 Hyperlipidemia, unspecified: Secondary | ICD-10-CM | POA: Diagnosis not present

## 2020-04-14 DIAGNOSIS — I10 Essential (primary) hypertension: Secondary | ICD-10-CM

## 2020-04-14 DIAGNOSIS — Z79899 Other long term (current) drug therapy: Secondary | ICD-10-CM | POA: Diagnosis not present

## 2020-04-14 DIAGNOSIS — E559 Vitamin D deficiency, unspecified: Secondary | ICD-10-CM

## 2020-04-14 DIAGNOSIS — F900 Attention-deficit hyperactivity disorder, predominantly inattentive type: Secondary | ICD-10-CM

## 2020-04-14 MED ORDER — METHYLPHENIDATE HCL ER (OSM) 36 MG PO TBCR: EXTENDED_RELEASE_TABLET | ORAL | 0 refills | Status: DC

## 2020-04-14 NOTE — Patient Instructions (Addendum)
Your LDL is not in range or at goal, goal is less than 70.  You are on the crestor 20 mg, to reach goal we may have to get to 40 mg or add on zetia.   Your LDL is the bad cholesterol that can lead to heart attack and stroke. To lower your number you can decrease your fatty foods, red meat, cheese, milk and increase fiber like whole grains and veggies. You can also add a fiber supplement like Citracel or Benefiber, these do not cause gas and bloating and are safe to use. Since you have risk factors that make [place] want your number below 70, we need to adjust or add medications to get your number below goal.    Check out ozempic a GLP medication   Diabetes or even increased sugars put you at 300% increased risk of heart attack and stroke.  ALSO BEING DIABETIC YOU MAY NOT HAVE ANY PAIN WITH A HEART ATTACK.  Even worse of a chance of no pain if you are a woman.  It is very unlikely that you will have any pain with a heart attack. Likely your symptoms will be very subtle, even for very severe disease.  Your symptoms for a heart attack will likely occur when you exert your self or exercise and include: Shortness of breath Sweating Nausea Dizziness Fast or irregular heart beats Fatigue   It makes me feel better if my diabetics get their heart rate up with exercise once or twice a week and pay close attention to your body. If there is ANY change in your exercise capacity or if you have symptoms above, please STOP and call 911 or call to come to the office.   PLEASE REMEMBER:  Diabetes is preventable! Up to 21 percent of complications and morbidities among individuals with type 2 diabetes can be prevented, delayed, or effectively treated and minimized with regular visits to a health professional, appropriate monitoring and medication, and a healthy diet and lifestyle.   Here is some information to help you keep your heart healthy: Move it! - Aim for 30 mins of activity every day. Take it slowly at  first. Talk to [place] before starting any new exercise program.   Lose it.  -Body Mass Index (BMI) can indicate if you need to lose weight. A healthy range is 18.5-24.9. For a BMI calculator, go to [employer]  Waist Management -Excess abdominal fat is a risk factor for heart disease, diabetes, asthma, stroke and more. Ideal waist circumference is less than 35" for women and less than 40" for men.   Eat Right -focus on fruits, vegetables, whole grains, and meals you make yourself. Avoid foods with trans fat and high sugar/sodium content.   Snooze or Snore? - Loud snoring can be a sign of sleep apnea, a significant risk factor for high blood pressure, heart attach, stroke, and heart arrhythmias.  Kick the habit -Quit Smoking! Avoid second hand smoke. A single cigarette raises your blood pressure for 20 mins and increases the risk of heart attack and stroke for the next 24 hours.   Are Aspirin and Supplements right for you? -Add ENTERIC COATED low dose 81 mg Aspirin daily OR can do every other day if you have easy bruising to protect your heart and head. As well as to reduce risk of Colon Cancer by 20 %, Skin Cancer by 26 % , Melanoma by 46% and Pancreatic cancer by 60%  Say "No to Stress -There may be little you  can do about problems that cause stress. However, techniques such as long walks, meditation, and exercise can help you manage it.   Start Now! - Make changes one at a time and set reasonable goals to increase your likelihood of success.

## 2020-04-15 LAB — LIPID PANEL
Cholesterol: 197 mg/dL (ref <200)
HDL: 62 mg/dL (ref >=40)
LDL Cholesterol (Calc): 110 mg/dL (calc) — ABNORMAL HIGH
Non-HDL Cholesterol (Calc): 135 mg/dL (calc) — ABNORMAL HIGH (ref <130)
Total CHOL/HDL Ratio: 3.2 (calc) (ref <5.0)
Triglycerides: 142 mg/dL (ref <150)

## 2020-04-15 LAB — TSH: TSH: 2.32 mIU/L (ref 0.40–4.50)

## 2020-04-15 LAB — COMPLETE METABOLIC PANEL WITH GFR
AG Ratio: 1.8 (calc) (ref 1.0–2.5)
ALT: 15 U/L (ref 9–46)
AST: 15 U/L (ref 10–35)
Albumin: 4.4 g/dL (ref 3.6–5.1)
Alkaline phosphatase (APISO): 57 U/L (ref 35–144)
BUN/Creatinine Ratio: 16 (calc) (ref 6–22)
BUN: 26 mg/dL — ABNORMAL HIGH (ref 7–25)
CO2: 28 mmol/L (ref 20–32)
Calcium: 9.4 mg/dL (ref 8.6–10.3)
Chloride: 102 mmol/L (ref 98–110)
Creat: 1.66 mg/dL — ABNORMAL HIGH (ref 0.70–1.18)
GFR, Est African American: 47 mL/min/{1.73_m2} — ABNORMAL LOW (ref >=60)
GFR, Est Non African American: 41 mL/min/{1.73_m2} — ABNORMAL LOW (ref >=60)
Globulin: 2.5 g/dL (calc) (ref 1.9–3.7)
Glucose, Bld: 135 mg/dL — ABNORMAL HIGH (ref 65–99)
Potassium: 5.1 mmol/L (ref 3.5–5.3)
Sodium: 136 mmol/L (ref 135–146)
Total Bilirubin: 0.4 mg/dL (ref 0.2–1.2)
Total Protein: 6.9 g/dL (ref 6.1–8.1)

## 2020-04-15 LAB — CBC WITH DIFFERENTIAL/PLATELET
Absolute Monocytes: 660 cells/uL (ref 200–950)
Basophils Absolute: 33 cells/uL (ref 0–200)
Basophils Relative: 0.5 %
Eosinophils Absolute: 271 cells/uL (ref 15–500)
Eosinophils Relative: 4.1 %
HCT: 47.3 % (ref 38.5–50.0)
Hemoglobin: 16.4 g/dL (ref 13.2–17.1)
Lymphs Abs: 1775 cells/uL (ref 850–3900)
MCH: 31.8 pg (ref 27.0–33.0)
MCHC: 34.7 g/dL (ref 32.0–36.0)
MCV: 91.8 fL (ref 80.0–100.0)
MPV: 10.1 fL (ref 7.5–12.5)
Monocytes Relative: 10 %
Neutro Abs: 3861 cells/uL (ref 1500–7800)
Neutrophils Relative %: 58.5 %
Platelets: 295 10*3/uL (ref 140–400)
RBC: 5.15 10*6/uL (ref 4.20–5.80)
RDW: 12.2 % (ref 11.0–15.0)
Total Lymphocyte: 26.9 %
WBC: 6.6 10*3/uL (ref 3.8–10.8)

## 2020-04-15 LAB — MAGNESIUM: Magnesium: 2.4 mg/dL (ref 1.5–2.5)

## 2020-04-15 LAB — HEMOGLOBIN A1C
Hgb A1c MFr Bld: 6.9 % of total Hgb — ABNORMAL HIGH (ref <5.7)
Mean Plasma Glucose: 151 (calc)
eAG (mmol/L): 8.4 (calc)

## 2020-04-15 LAB — VITAMIN D 25 HYDROXY (VIT D DEFICIENCY, FRACTURES): Vit D, 25-Hydroxy: 82 ng/mL (ref 30–100)

## 2020-04-15 NOTE — Progress Notes (Signed)
Patient is aware of lab results and instructions.  1 month follow up has been scheduled. -e welch

## 2020-05-18 ENCOUNTER — Other Ambulatory Visit: Payer: Self-pay

## 2020-05-18 ENCOUNTER — Ambulatory Visit: Payer: Managed Care, Other (non HMO) | Admitting: Adult Health Nurse Practitioner

## 2020-05-18 ENCOUNTER — Encounter: Payer: Self-pay | Admitting: Adult Health Nurse Practitioner

## 2020-05-18 VITALS — BP 128/88 | HR 106 | Temp 97.4°F | Wt 224.0 lb

## 2020-05-18 DIAGNOSIS — I10 Essential (primary) hypertension: Secondary | ICD-10-CM | POA: Diagnosis not present

## 2020-05-18 DIAGNOSIS — F900 Attention-deficit hyperactivity disorder, predominantly inattentive type: Secondary | ICD-10-CM

## 2020-05-18 DIAGNOSIS — N182 Chronic kidney disease, stage 2 (mild): Secondary | ICD-10-CM | POA: Diagnosis not present

## 2020-05-18 DIAGNOSIS — E1122 Type 2 diabetes mellitus with diabetic chronic kidney disease: Secondary | ICD-10-CM | POA: Diagnosis not present

## 2020-05-18 DIAGNOSIS — N183 Chronic kidney disease, stage 3 unspecified: Secondary | ICD-10-CM

## 2020-05-18 MED ORDER — METHYLPHENIDATE HCL ER (OSM) 36 MG PO TBCR: EXTENDED_RELEASE_TABLET | ORAL | 0 refills | Status: DC

## 2020-05-18 NOTE — Progress Notes (Signed)
FOLLOW UP  FOR LABS, CKD  Assessment and Plan:   Miko was seen today for follow-up.  Diagnoses and all orders for this visit:  CKD stage 3 due to type 2 diabetes mellitus ([place]) Continue 64-80 oz of fluid daily Continue glucose monitoring Continue medications Increase fluids Avoid NSAIDS Discussed dietary modifications and exercise Will continue to monitor -     COMPLETE METABOLIC PANEL WITH GFR  Type 2 diabetes mellitus with stage 2 chronic kidney disease, without long-term current use of insulin (H[facility]) Continue medications: Metformin 534m two tablets twice a day. Glipizide 525m TID, Farxiga 1067maily in am. Completed DOT physical, consider GLP1? Discussed general issues about diabetes pathophysiology and management. Education: Reviewed 'ABCs' of diabetes management (respective goals in parentheses):  A1C (<7), blood pressure (<130/80), and cholesterol (LDL <70) Dietary recommendations Encouraged aerobic exercise.  Discussed foot care, check daily Yearly retinal exam Dental exam every 6 months Monitor blood glucose, discussed goal for patient   Essential hypertension Continue current medications: Olmesartan 36m29mD Monitor blood pressure at home; call if consistently over 130/80 Continue DASH diet.   Reminder to go to the ER if any CP, SOB, nausea, dizziness, severe HA, changes vision/speech, left arm numbness and tingling and jaw pain.  Attention deficit hyperactivity disorder (ADHD), predominantly inattentive type  ADD -  Continue ADD medication, helps with focus, no AE's. The patient was counseled on the addictive nature of the medication and was encouraged to take drug holidays when not needed.  -     methylphenidate (CONCERTA) 36 MG PO CR tablet; Take 1 to 2 tablets    Call patient with lab results    Continue diet and meds as discussed. Further disposition pending results of labs. Discussed med's effects and SE's.    Further disposition pending results  if labs check today. Discussed med's effects and SE's.   Over 30 minutes of face to face interview, exam, counseling, chart review, and critical decision making was performed.    Future Appointments  Date Time Provider DepaElmer10/2022  9:00 AM Talisa Petrak, KyraDanton Sewer [facility] None    ----------------------------------------------------------------------------------------------------------------------  HPI 69 y71. male  presents for 3 month follow up on labs for elevated Cre 1.66 (1.38) (1.24), GFR decreased 41 (51) (59), HTN and DMII. He also has history of  morbid obesity, ADD and vitamin D deficiency.   Report he drinks out of a 20oz water bottle and he drinks about 6 or more of these a day. He worked on increasing this after the last appointment.  Patient is on an ADD medication (concerta 36 mg x 2 tabs) he states that the medication is helping and he denies any adverse reactions. He is in need of a refill today.  BMI is Body mass index is 35.61 kg/m., he has been working on diet and exercise. Wt Readings from Last 3 Encounters:  05/18/20 224 lb (101.6 kg)  04/14/20 219 lb (99.3 kg)  01/08/20 217 lb (98.4 kg)   His blood pressure has been controlled at home, today their BP is BP: 128/88  He does workout. He denies chest pain, shortness of breath, dizziness.  Pulse is elevated today, reports he was rushing to get here.   He is on cholesterol medication (crestor 20 mg daily) and denies myalgias. His cholesterol is not at goal of less than 70. The cholesterol last visit was:   Lab Results  Component Value Date   CHOL 197 04/14/2020   HDL 62 04/14/2020  [place] 110 (H) 04/14/2020   TRIG 142 04/14/2020   CHOLHDL 3.2 04/14/2020    He has been working on diet and exercise for DMII- has been increasing steadily since 08/2018 With CKD on ARB With hyperlipidemia not at goal of less than 70, on crestor 63m on metformin and glipizide 5 mg TID, with farxiga 126madded  last visit.   He is checking sugars 2-3 x a day and as needed. Fasting range is 100-120's. denies foot ulcerations, increased appetite, nausea, paresthesia of the feet, polydipsia, polyuria, visual disturbances, vomiting and weight loss.    Last A1C in the office improved from 12.7 to:  Lab Results  Component Value Date   HGBA1C 6.9 (H) 04/14/2020   Lab Results  Component Value Date   GFRNONAA 41 (L) 04/14/2020   Patient is on Vitamin D supplement, on 50,000 4 x a week, over goal last visit Lab Results  Component Value Date   VD25OH 82 04/14/2020        Current Medications:   Current Outpatient Medications (Endocrine & Metabolic):  .  dapagliflozin propanediol (FARXIGA) 10 MG TABS tablet, Take 1 tablet Daily for Diabetes .  glipiZIDE (GLUCOTROL) 5 MG tablet, Take    1 tablet    3 x /day    with meals for Diabetes .  metFORMIN (GLUCOPHAGE-XR) 500 MG 24 hr tablet, Take 2 tablets 2 x /day with Meals for Diabetes  Current Outpatient Medications (Cardiovascular):  .  olmesartan (BENICAR) 40 MG tablet, Take 40 mg by mouth 2 (two) times daily. .  rosuvastatin (CRESTOR) 20 MG tablet, Take 1 tablet Daily for Cholesterol .  sildenafil (VIAGRA) 100 MG tablet, Take 1 tablet (100 mg total) by mouth as needed for erectile dysfunction. .  tadalafil (CIALIS) 20 MG tablet, Take 1/2 to 1 tablet every 2 to 3 days as needed for XXXX   Current Outpatient Medications (Analgesics):  .  aspirin 81 MG chewable tablet, Chew by mouth daily.   Current Outpatient Medications (Other):  .  blood glucose meter kit and supplies KIT, Dispense based on patient's  insurance preference, [employer] Elite-E11.22 .  FREESTYLE LITE test strip, CHECK GLUCOSE THREE TIMES DAILY .  ketoconazole (NIZORAL) 2 % cream, Apply 1 application topically 2 (two) times daily. .  Lancets (FREESTYLE) lancets, TEST BLOOD SUGAR THREE TIMES DAILY .  methylphenidate (CONCERTA) 36 MG PO CR tablet, Take 1 to 2 tablets Daily if needed for  Alertness.  More effective if not take daily. .  Vitamin D, Ergocalciferol, (DRISDOL) 1.25 MG (50000 UNIT) CAPS capsule, TAKE 1 CAPSULE BY MOUTH 4 TIMES EVERY WEEK   Allergies:  Allergies  Allergen Reactions  . Adderall [Amphetamine-Dextroamphet Er]     MOOD CHANGE  . Lipitor [Atorvastatin]     MYALGIA  . Other     FLU VACCINE  . Wellbutrin [Bupropion]     HA  . Zoloft [Sertraline Hcl]     AGGITATION     Medical History:  Past Medical History:  Diagnosis Date  . ADD (attention deficit disorder)   . Anxiety   . Depression   . [place]  . Hyperlipidemia   . Hypertension   . Unspecified vitamin D deficiency    Family history- Reviewed and unchanged Social history- Reviewed and unchanged   Review of Systems:  Review of Systems  Constitutional: Negative for malaise/fatigue and weight loss.  HENT: Negative for hearing loss and tinnitus.   Eyes: Negative for blurred vision and  double vision.  Respiratory: Negative for cough, shortness of breath and wheezing.   Cardiovascular: Negative for chest pain, palpitations, orthopnea, claudication and leg swelling.  Gastrointestinal: Negative for abdominal pain, blood in stool, constipation, diarrhea, heartburn, melena, nausea and vomiting.  Genitourinary: Negative.   Musculoskeletal: Negative for joint pain and myalgias.  Skin: Negative for rash.  Neurological: Negative for dizziness, tingling, sensory change, weakness and headaches.  Endo/Heme/Allergies: Negative for polydipsia.  Psychiatric/Behavioral: Negative.   All other systems reviewed and are negative.   Physical Exam: BP 128/88   Pulse (!) 106   Temp (!) 97.4 F (36.3 C)   Wt 224 lb (101.6 kg)   SpO2 96%   BMI 35.61 kg/m  Wt Readings from Last 3 Encounters:  05/18/20 224 lb (101.6 kg)  04/14/20 219 lb (99.3 kg)  01/08/20 217 lb (98.4 kg)   General Appearance: Well nourished, in no apparent distress. Eyes: PERRLA, EOMs,  conjunctiva no swelling or erythema Sinuses: No Frontal/maxillary tenderness ENT/Mouth: Ext aud canals clear, TMs without erythema, bulging. Mouth and nose not examined- patient wearing a facemask  Hearing normal.  Neck: Supple, thyroid normal.  Respiratory: Respiratory effort normal, BS equal bilaterally without rales, rhonchi, wheezing or stridor.  Cardio: RRR with no MRGs. Brisk peripheral pulses without edema.  Abdomen: Soft, + BS. Obese Non tender, no guarding, rebound, hernias, masses. Lymphatics: Non tender without lymphadenopathy.  Musculoskeletal: Full ROM, 5/5 strength, Normal gait Skin: Warm, dry without rashes, lesions, ecchymosis.  Neuro: Cranial nerves intact. No cerebellar symptoms.  Psych: Awake and oriented X 3, normal affect, Insight and Judgment appropriate.    Garnet Sierras, NP 11:44 AM [facility]

## 2020-05-19 LAB — COMPLETE METABOLIC PANEL WITH GFR
AG Ratio: 1.8 (calc) (ref 1.0–2.5)
ALT: 17 U/L (ref 9–46)
AST: 16 U/L (ref 10–35)
Albumin: 4.5 g/dL (ref 3.6–5.1)
Alkaline phosphatase (APISO): 61 U/L (ref 35–144)
BUN/Creatinine Ratio: 16 (calc) (ref 6–22)
BUN: 26 mg/dL — ABNORMAL HIGH (ref 7–25)
CO2: 26 mmol/L (ref 20–32)
Calcium: 9.5 mg/dL (ref 8.6–10.3)
Chloride: 103 mmol/L (ref 98–110)
Creat: 1.67 mg/dL — ABNORMAL HIGH (ref 0.70–1.18)
GFR, Est African American: 47 mL/min/{1.73_m2} — ABNORMAL LOW (ref >=60)
GFR, Est Non African American: 41 mL/min/{1.73_m2} — ABNORMAL LOW (ref >=60)
Globulin: 2.5 g/dL (calc) (ref 1.9–3.7)
Glucose, Bld: 145 mg/dL — ABNORMAL HIGH (ref 65–99)
Potassium: 4.8 mmol/L (ref 3.5–5.3)
Sodium: 137 mmol/L (ref 135–146)
Total Bilirubin: 0.4 mg/dL (ref 0.2–1.2)
Total Protein: 7 g/dL (ref 6.1–8.1)

## 2020-05-20 ENCOUNTER — Other Ambulatory Visit: Payer: Self-pay | Admitting: Adult Health Nurse Practitioner

## 2020-05-20 DIAGNOSIS — E1122 Type 2 diabetes mellitus with diabetic chronic kidney disease: Secondary | ICD-10-CM

## 2020-05-20 DIAGNOSIS — N183 Chronic kidney disease, stage 3 unspecified: Secondary | ICD-10-CM

## 2020-05-20 NOTE — Progress Notes (Signed)
Patient is aware of lab results and Metformin instructions. 2 week lab only has been scheduled for Thursday, November 4th.  Joseph Campbell

## 2020-06-02 ENCOUNTER — Other Ambulatory Visit: Payer: Self-pay

## 2020-06-02 ENCOUNTER — Other Ambulatory Visit: Payer: Managed Care, Other (non HMO)

## 2020-06-02 DIAGNOSIS — E1122 Type 2 diabetes mellitus with diabetic chronic kidney disease: Secondary | ICD-10-CM

## 2020-06-02 DIAGNOSIS — N183 Chronic kidney disease, stage 3 unspecified: Secondary | ICD-10-CM

## 2020-06-02 DIAGNOSIS — N182 Chronic kidney disease, stage 2 (mild): Secondary | ICD-10-CM | POA: Diagnosis not present

## 2020-06-03 LAB — COMPLETE METABOLIC PANEL WITH GFR
AG Ratio: 1.9 (calc) (ref 1.0–2.5)
ALT: 15 U/L (ref 9–46)
AST: 11 U/L (ref 10–35)
Albumin: 4.7 g/dL (ref 3.6–5.1)
Alkaline phosphatase (APISO): 64 U/L (ref 35–144)
BUN/Creatinine Ratio: 13 (calc) (ref 6–22)
BUN: 23 mg/dL (ref 7–25)
CO2: 25 mmol/L (ref 20–32)
Calcium: 9.8 mg/dL (ref 8.6–10.3)
Chloride: 105 mmol/L (ref 98–110)
Creat: 1.82 mg/dL — ABNORMAL HIGH (ref 0.70–1.18)
GFR, Est African American: 42 mL/min/{1.73_m2} — ABNORMAL LOW (ref >=60)
GFR, Est Non African American: 37 mL/min/{1.73_m2} — ABNORMAL LOW (ref >=60)
Globulin: 2.5 g/dL (calc) (ref 1.9–3.7)
Glucose, Bld: 226 mg/dL — ABNORMAL HIGH (ref 65–99)
Potassium: 5.4 mmol/L — ABNORMAL HIGH (ref 3.5–5.3)
Sodium: 139 mmol/L (ref 135–146)
Total Bilirubin: 0.5 mg/dL (ref 0.2–1.2)
Total Protein: 7.2 g/dL (ref 6.1–8.1)

## 2020-06-07 ENCOUNTER — Other Ambulatory Visit: Payer: Self-pay | Admitting: Adult Health Nurse Practitioner

## 2020-06-07 DIAGNOSIS — N183 Chronic kidney disease, stage 3 unspecified: Secondary | ICD-10-CM

## 2020-06-07 DIAGNOSIS — E1122 Type 2 diabetes mellitus with diabetic chronic kidney disease: Secondary | ICD-10-CM

## 2020-06-07 NOTE — Progress Notes (Signed)
Patient is aware of lab results. He states that he is taking Metformin, Glipizide, and [place]. He says that he is drinking "something" all the time. Mostly soft drinks but is going to try and do better replacing them with water. I told him that I would give you this information and we would be back In touch with him with your recommendation.  -Marcelino Scot

## 2020-06-09 NOTE — Progress Notes (Signed)
Patient states he is drinking more water and has been taking 2 pills of Metformin Daily. 2 week lab only has been scheduled to re-check his kidney functions. -e welch

## 2020-06-15 ENCOUNTER — Encounter: Payer: Self-pay | Admitting: Internal Medicine

## 2020-06-27 ENCOUNTER — Other Ambulatory Visit: Payer: Self-pay

## 2020-06-27 ENCOUNTER — Other Ambulatory Visit: Payer: Managed Care, Other (non HMO)

## 2020-06-27 DIAGNOSIS — E1122 Type 2 diabetes mellitus with diabetic chronic kidney disease: Secondary | ICD-10-CM

## 2020-06-27 DIAGNOSIS — I10 Essential (primary) hypertension: Secondary | ICD-10-CM

## 2020-06-27 DIAGNOSIS — N183 Chronic kidney disease, stage 3 unspecified: Secondary | ICD-10-CM

## 2020-06-27 LAB — COMPLETE METABOLIC PANEL WITH GFR
AG Ratio: 1.6 (calc) (ref 1.0–2.5)
ALT: 22 U/L (ref 9–46)
AST: 14 U/L (ref 10–35)
Albumin: 4.6 g/dL (ref 3.6–5.1)
Alkaline phosphatase (APISO): 73 U/L (ref 35–144)
BUN/Creatinine Ratio: 20 (calc) (ref 6–22)
BUN: 29 mg/dL — ABNORMAL HIGH (ref 7–25)
CO2: 26 mmol/L (ref 20–32)
Calcium: 9.9 mg/dL (ref 8.6–10.3)
Chloride: 102 mmol/L (ref 98–110)
Creat: 1.44 mg/dL — ABNORMAL HIGH (ref 0.70–1.18)
GFR, Est African American: 56 mL/min/{1.73_m2} — ABNORMAL LOW (ref >=60)
GFR, Est Non African American: 49 mL/min/{1.73_m2} — ABNORMAL LOW (ref >=60)
Globulin: 2.8 g/dL (calc) (ref 1.9–3.7)
Glucose, Bld: 225 mg/dL — ABNORMAL HIGH (ref 65–99)
Potassium: 4.5 mmol/L (ref 3.5–5.3)
Sodium: 137 mmol/L (ref 135–146)
Total Bilirubin: 0.4 mg/dL (ref 0.2–1.2)
Total Protein: 7.4 g/dL (ref 6.1–8.1)

## 2020-07-01 ENCOUNTER — Other Ambulatory Visit: Payer: Self-pay | Admitting: Internal Medicine

## 2020-07-01 DIAGNOSIS — F900 Attention-deficit hyperactivity disorder, predominantly inattentive type: Secondary | ICD-10-CM

## 2020-07-01 MED ORDER — METHYLPHENIDATE HCL ER (OSM) 36 MG PO TBCR: EXTENDED_RELEASE_TABLET | ORAL | 0 refills | Status: DC

## 2020-08-08 ENCOUNTER — Ambulatory Visit: Payer: Managed Care, Other (non HMO) | Admitting: Adult Health Nurse Practitioner

## 2020-08-08 ENCOUNTER — Encounter: Payer: Self-pay | Admitting: Adult Health Nurse Practitioner

## 2020-08-08 ENCOUNTER — Other Ambulatory Visit: Payer: Self-pay

## 2020-08-08 VITALS — BP 122/78 | HR 115 | Temp 97.5°F | Ht 66.5 in | Wt 227.0 lb

## 2020-08-08 DIAGNOSIS — Z136 Encounter for screening for cardiovascular disorders: Secondary | ICD-10-CM | POA: Diagnosis not present

## 2020-08-08 DIAGNOSIS — E1169 Type 2 diabetes mellitus with other specified complication: Secondary | ICD-10-CM | POA: Diagnosis not present

## 2020-08-08 DIAGNOSIS — E559 Vitamin D deficiency, unspecified: Secondary | ICD-10-CM

## 2020-08-08 DIAGNOSIS — N138 Other obstructive and reflux uropathy: Secondary | ICD-10-CM

## 2020-08-08 DIAGNOSIS — N183 Chronic kidney disease, stage 3 unspecified: Secondary | ICD-10-CM

## 2020-08-08 DIAGNOSIS — E349 Endocrine disorder, unspecified: Secondary | ICD-10-CM

## 2020-08-08 DIAGNOSIS — R300 Dysuria: Secondary | ICD-10-CM | POA: Diagnosis not present

## 2020-08-08 DIAGNOSIS — R000 Tachycardia, unspecified: Secondary | ICD-10-CM

## 2020-08-08 DIAGNOSIS — E1122 Type 2 diabetes mellitus with diabetic chronic kidney disease: Secondary | ICD-10-CM

## 2020-08-08 DIAGNOSIS — I10 Essential (primary) hypertension: Secondary | ICD-10-CM

## 2020-08-08 DIAGNOSIS — Z125 Encounter for screening for malignant neoplasm of prostate: Secondary | ICD-10-CM

## 2020-08-08 DIAGNOSIS — N182 Chronic kidney disease, stage 2 (mild): Secondary | ICD-10-CM

## 2020-08-08 DIAGNOSIS — E785 Hyperlipidemia, unspecified: Secondary | ICD-10-CM

## 2020-08-08 DIAGNOSIS — N401 Enlarged prostate with lower urinary tract symptoms: Secondary | ICD-10-CM

## 2020-08-08 DIAGNOSIS — F900 Attention-deficit hyperactivity disorder, predominantly inattentive type: Secondary | ICD-10-CM | POA: Diagnosis not present

## 2020-08-08 DIAGNOSIS — Z79899 Other long term (current) drug therapy: Secondary | ICD-10-CM

## 2020-08-08 DIAGNOSIS — F325 Major depressive disorder, single episode, in full remission: Secondary | ICD-10-CM

## 2020-08-08 MED ORDER — METHYLPHENIDATE HCL ER (OSM) 36 MG PO TBCR: EXTENDED_RELEASE_TABLET | ORAL | 0 refills | Status: DC

## 2020-08-08 MED ORDER — GLIPIZIDE 5 MG PO TABS: ORAL_TABLET | ORAL | 2 refills | Status: DC

## 2020-08-08 MED ORDER — DAPAGLIFLOZIN PROPANEDIOL 10 MG PO TABS
ORAL_TABLET | ORAL | 2 refills | Status: DC
Start: 2020-08-08 — End: 2021-06-21

## 2020-08-08 NOTE — Progress Notes (Signed)
COMPLETE PHYSICAL   Assessment and Plan:   Joseph Campbell was seen today for follow-up.  Diagnoses and all orders for this visit:  Essential hypertension Continue current medications: Olmesartan 60m BID Monitor blood pressure at home; call if consistently over 130/80 Continue DASH diet.   Reminder to go to the ER if any CP, SOB, nausea, dizziness, severe HA, changes vision/speech, left arm numbness and tingling and jaw pain.  CKD stage 3 due to type 2 diabetes mellitus (HCC) Continue 64-80 oz of fluid daily Continue glucose monitoring Continue medications Increase fluids Avoid NSAIDS Discussed dietary modifications and exercise Will continue to monitor -     COMPLETE METABOLIC PANEL WITH GFR  Type 2 diabetes mellitus with stage 2 chronic kidney disease, without long-term current use of insulin (HCC) Continue medications: Metformin 5046mtwo tablets twice a day. Glipizide 57m70mTID, Farxiga 73m31mily in am. Completed DOT physical, consider GLP1? Discussed general issues about diabetes pathophysiology and management. Education: Reviewed 'ABCs' of diabetes management (respective goals in parentheses):  A1C (<7), blood pressure (<130/80), and cholesterol (LDL <70) Dietary recommendations Encouraged aerobic exercise.  Discussed foot care, check daily Yearly retinal exam Dental exam every 6 months Monitor blood glucose, discussed goal for patient   Attention deficit hyperactivity disorder (ADHD), predominantly inattentive type  ADD -  Continue ADD medication, helps with focus, no AE's. The patient was counseled on the addictive nature of the medication and was encouraged to take drug holidays when not needed.  -     methylphenidate (CONCERTA) 36 MG PO CR tablet; Take 1 to 2 tablets   Hyperlipidemia associated with type 2 diabetes mellitus (HCC) Continue medications: rosuvastatin 20mg68mly Discussed dietary and exercise modifications Low fat diet -     Lipid panel  Attention  deficit hyperactivity disorder (ADHD), predominantly inattentive type -     methylphenidate (CONCERTA) 36 MG PO CR tablet; Take 1 to 2 tablets Daily if needed for Alertness.  More effective if not take daily.  Depression, major, in remission (HCC) [place] Continue medications:  Discussed stress management techniques  Discussed, increase water,intake & good sleep hygiene  Discussed increasing exercise & vegetables in diet  Vitamin D deficiency Continue supplementation to maintain goal of 70-100 No supplement Defer vitamin D level  Morbid obesity (BMI 36.41) Discussed dietary and exercise modifications  Testosterone deficiency No supplementation at this time  BPH with obstruction/lower urinary tract symptoms Doing well at this time Will continue to monitor Defer PSA this check  Medication management Continues   Screening PSA (prostate specific antigen) -     PSA  Screening, ischemic heart disease -     EKG 12-Lead  Dysuria -     Urinalysis w microscopic + reflex cultur  Tachycardia Continue to monitor   Call patient with lab results    Continue diet and meds as discussed. Further disposition pending results of labs. Discussed med's effects and SE's.    Further disposition pending results if labs check today. Discussed med's effects and SE's.   Over 30 minutes of face to face interview, exam, counseling, chart review, and critical decision making was performed.    Future Appointments  Date Time Provider DeparMunday9/2022  8:45 AM CorbeLiane Comber[facility] None  08/08/2021 10:00 AM Wynne Jury,[place] SewerGAAM-[facility] None    ----------------------------------------------------------------------------------------------------------------------  HPI 69 y.21 male  presents for 3 month follow up on labs for elevated Cre 1.66 (1.38) (1.24), GFR decreased 41 (51) (59), HTN and DMII. He also  has history of  morbid obesity, ADD and vitamin D  deficiency.   Denies any health or medication concerns today.  Report he drinks out of a 20oz water bottle and he drinks about 6 or more of these a day. He worked on increasing this after the last appointment.  Patient is on an ADD medication (concerta 36 mg x 2 tabs) he states that the medication is helping and he denies any adverse reactions. He is in need of a refill today.   BMI is Body mass index is 36.09 kg/m., he has been working on diet and exercise. Wt Readings from Last 3 Encounters:  08/08/20 227 lb (103 kg)  05/18/20 224 lb (101.6 kg)  04/14/20 219 lb (99.3 kg)   His blood pressure has been controlled at home, today their BP is BP: 122/78  He does workout. He denies chest pain, shortness of breath, dizziness.  Pulse is elevated today, reports he was rushing to get here.   He is on cholesterol medication (crestor 20 mg daily) and denies myalgias. His cholesterol is not at goal of less than 70. The cholesterol last visit was:   Lab Results  Component Value Date   CHOL 197 04/14/2020   HDL 62 04/14/2020   LDLCALC 110 (H) 04/14/2020   TRIG 142 04/14/2020   CHOLHDL 3.2 04/14/2020    He has been working on diet and exercise for DMII- has been increasing steadily since 08/2018 With CKD on ARB With hyperlipidemia not at goal of less than 70, on crestor 24m on metformin and glipizide 5 mg TID, with farxiga 130madded last visit.   He is checking sugars 2-3 x a day and as needed. Fasting range is 115-130's. denies foot ulcerations, increased appetite, nausea, paresthesia of the feet, polydipsia, polyuria, visual disturbances, vomiting and weight loss.    Last A1C in the office improved from 12.7 to:  Lab Results  Component Value Date   HGBA1C 6.9 (H) 04/14/2020   Lab Results  Component Value Date   GFRNONAA 4919L) 06/27/2020   Patient is on Vitamin D supplement, on 50,000 4 x a week, over goal last visit Lab Results  Component Value Date   VD25OH 82 04/14/2020         Current Medications:   Current Outpatient Medications (Endocrine & Metabolic):  .  metFORMIN (GLUCOPHAGE-XR) 500 MG 24 hr tablet, Take 2 tablets 2 x /day with Meals for Diabetes .  dapagliflozin propanediol (FARXIGA) 10 MG TABS tablet, Take 1 tablet Daily for Diabetes .  glipiZIDE (GLUCOTROL) 5 MG tablet, Take    1 tablet    3 x /day    with meals for Diabetes  Current Outpatient Medications (Cardiovascular):  .  olmesartan (BENICAR) 40 MG tablet, Take 40 mg by mouth 2 (two) times daily. .  rosuvastatin (CRESTOR) 20 MG tablet, Take 1 tablet Daily for Cholesterol .  sildenafil (VIAGRA) 100 MG tablet, Take 1 tablet (100 mg total) by mouth as needed for erectile dysfunction. .  tadalafil (CIALIS) 20 MG tablet, Take 1/2 to 1 tablet every 2 to 3 days as needed for XXXX   Current Outpatient Medications (Analgesics):  .  aspirin 81 MG chewable tablet, Chew by mouth daily.   Current Outpatient Medications (Other):  .  blood glucose meter kit and supplies KIT, Dispense based on patient's  insurance preference, [employer]encia Elite-E11.22 .  FREESTYLE LITE test strip, CHECK GLUCOSE THREE TIMES DAILY .  ketoconazole (NIZORAL) 2 % cream, Apply 1 application  topically 2 (two) times daily. .  Lancets (FREESTYLE) lancets, TEST BLOOD SUGAR THREE TIMES DAILY .  Vitamin D, Ergocalciferol, (DRISDOL) 1.25 MG (50000 UNIT) CAPS capsule, TAKE 1 CAPSULE BY MOUTH 4 TIMES EVERY WEEK .  methylphenidate (CONCERTA) 36 MG PO CR tablet, Take 1 to 2 tablets Daily if needed for Alertness.  More effective if not take daily.   Allergies:  Allergies  Allergen Reactions  . Adderall [Amphetamine-Dextroamphet Er]     MOOD CHANGE  . Lipitor [Atorvastatin]     MYALGIA  . Other     FLU VACCINE  . Wellbutrin [Bupropion]     HA  . Zoloft [Sertraline Hcl]     AGGITATION     Medical History:  Past Medical History:  Diagnosis Date  . ADD (attention deficit disorder)   . Anxiety   . Depression   . Diabetes  mellitus without complication ([place])   . Hyperlipidemia   . Hypertension   . Unspecified vitamin D deficiency    Family history- Reviewed and unchanged Social history- Reviewed and unchanged   Names of Other Physician/Practitioners you currently use: 1.  [facility] here for primary care 2. Eye Exam, DUE 3.Dental exam: DUE Patient Care Team: Unk Pinto, MD as PCP - General (Internal Medicine)    Screening Tests: Immunization History  Administered Date(s) Administered  . DT (Pediatric) 09/16/2014  . PPD Test 09/16/2014, 10/28/2015  . Pneumococcal-Unspecified 07/31/1999  . Td 07/31/2003  . Zoster 05/26/2013    Preventative care: Last colonoscopy: 05/2019   Vaccinations: TD or Tdap: 2016  Influenza: Declines Pneumococcal: Declines Prevnar13: Dclines Zoster 2014 Shingles/Zostavax: Discussed SARS-COV2-Declines   Review of Systems:  Review of Systems  Constitutional: Negative for malaise/fatigue and weight loss.  HENT: Negative for hearing loss and tinnitus.   Eyes: Negative for blurred vision and double vision.  Respiratory: Negative for cough, shortness of breath and wheezing.   Cardiovascular: Negative for chest pain, palpitations, orthopnea, claudication and leg swelling.  Gastrointestinal: Negative for abdominal pain, blood in stool, constipation, diarrhea, heartburn, melena, nausea and vomiting.  Genitourinary: Negative.   Musculoskeletal: Negative for joint pain and myalgias.  Skin: Negative for rash.  Neurological: Negative for dizziness, tingling, sensory change, weakness and headaches.  Endo/Heme/Allergies: Negative for polydipsia.  Psychiatric/Behavioral: Negative.   All other systems reviewed and are negative.   Physical Exam: BP 122/78   Pulse (!) 115   Temp (!) 97.5 F (36.4 C)   Ht 5' 6.5" (1.689 m)   Wt 227 lb (103 kg)   SpO2 97%   BMI 36.09 kg/m  Wt Readings from Last 3 Encounters:  08/08/20 227  lb (103 kg)  05/18/20 224 lb (101.6 kg)  04/14/20 219 lb (99.3 kg)   General Appearance: Well nourished, in no apparent distress. Eyes: PERRLA, EOMs, conjunctiva no swelling or erythema Sinuses: No Frontal/maxillary tenderness ENT/Mouth: Ext aud canals clear, TMs without erythema, bulging. Mouth and nose not examined- patient wearing a facemask  Hearing normal.  Neck: Supple, thyroid normal.  Respiratory: Respiratory effort normal, BS equal bilaterally without rales, rhonchi, wheezing or stridor.  Cardio: RRR with no MRGs. Brisk peripheral pulses without edema.  Abdomen: Soft distended, + BS. Obese Non tender, no guarding, rebound, hernias, masses. Lymphatics: Non tender without lymphadenopathy.  Musculoskeletal: Full ROM, 5/5 strength, Normal gait Skin: Warm, dry flaky, without rashes, lesions, ecchymosis.  Neuro: Cranial nerves intact. No cerebellar symptoms.  Psych: Awake and oriented X 3, normal affect, Insight and Judgment appropriate.  Garnet Sierras, Laqueta Jean, DNP [facility] 08/08/2020  10:03 AM

## 2020-08-08 NOTE — Patient Instructions (Signed)
  Check your blood pressure and pulse at home.  Pulse should be less than 90.  Check this for the next week.  Check in morning or after you are home in the evening.  If it is consistently above 90 please contact the office to let us know.    Ear care: For regular maintenance: Use warm or room temp peroxide in ear once every 1-2 weeks, then apply oil that night x1.   Next morning let warm water from shower run into your ears.   This will help flush out any wax Do not use cotton swabs in your ears Should you have pain, decrease in your ability to hear or dizziness please contact the office for an appointment.   GENERAL HEALTH GOALS  Know what a healthy weight is for you (roughly BMI <25) and aim to maintain this  Aim for 7+ servings of fruits and vegetables daily  70-80+ fluid ounces of water or unsweet tea for healthy kidneys  Limit to max 1 drink of alcohol per day; avoid smoking/tobacco  Limit animal fats in diet for cholesterol and heart health - choose grass fed whenever available  Avoid highly processed foods, and foods high in saturated/trans fats  Aim for low stress - take time to unwind and care for your mental health  Aim for 150 min of moderate intensity exercise weekly for heart health, and weights twice weekly for bone health  Aim for 7-9 hours of sleep daily

## 2020-08-09 LAB — COMPLETE METABOLIC PANEL WITH GFR
AG Ratio: 1.6 (calc) (ref 1.0–2.5)
ALT: 18 U/L (ref 9–46)
AST: 15 U/L (ref 10–35)
Albumin: 4.6 g/dL (ref 3.6–5.1)
Alkaline phosphatase (APISO): 74 U/L (ref 35–144)
BUN/Creatinine Ratio: 11 (calc) (ref 6–22)
BUN: 13 mg/dL (ref 7–25)
CO2: 27 mmol/L (ref 20–32)
Calcium: 9.8 mg/dL (ref 8.6–10.3)
Chloride: 101 mmol/L (ref 98–110)
Creat: 1.19 mg/dL — ABNORMAL HIGH (ref 0.70–1.18)
GFR, Est African American: 71 mL/min/{1.73_m2} (ref >=60)
GFR, Est Non African American: 61 mL/min/{1.73_m2} (ref >=60)
Globulin: 2.8 g/dL (calc) (ref 1.9–3.7)
Glucose, Bld: 198 mg/dL — ABNORMAL HIGH (ref 65–99)
Potassium: 5 mmol/L (ref 3.5–5.3)
Sodium: 137 mmol/L (ref 135–146)
Total Bilirubin: 0.5 mg/dL (ref 0.2–1.2)
Total Protein: 7.4 g/dL (ref 6.1–8.1)

## 2020-08-09 LAB — CBC WITH DIFFERENTIAL/PLATELET
Absolute Monocytes: 731 cells/uL (ref 200–950)
Basophils Absolute: 44 cells/uL (ref 0–200)
Basophils Relative: 0.5 %
Eosinophils Absolute: 348 cells/uL (ref 15–500)
Eosinophils Relative: 4 %
HCT: 48.8 % (ref 38.5–50.0)
Hemoglobin: 16.5 g/dL (ref 13.2–17.1)
Lymphs Abs: 2097 cells/uL (ref 850–3900)
MCH: 31 pg (ref 27.0–33.0)
MCHC: 33.8 g/dL (ref 32.0–36.0)
MCV: 91.6 fL (ref 80.0–100.0)
MPV: 10.5 fL (ref 7.5–12.5)
Monocytes Relative: 8.4 %
Neutro Abs: 5481 cells/uL (ref 1500–7800)
Neutrophils Relative %: 63 %
Platelets: 339 10*3/uL (ref 140–400)
RBC: 5.33 10*6/uL (ref 4.20–5.80)
RDW: 12 % (ref 11.0–15.0)
Total Lymphocyte: 24.1 %
WBC: 8.7 10*3/uL (ref 3.8–10.8)

## 2020-08-09 LAB — MICROALBUMIN / CREATININE URINE RATIO
Creatinine, Urine: 76 mg/dL (ref 20–320)
Microalb Creat Ratio: 25 mcg/mg creat (ref <30)
Microalb, Ur: 1.9 mg/dL

## 2020-08-09 LAB — NO CULTURE INDICATED

## 2020-08-09 LAB — HEMOGLOBIN A1C
Hgb A1c MFr Bld: 9.3 % of total Hgb — ABNORMAL HIGH (ref <5.7)
Mean Plasma Glucose: 220 mg/dL
eAG (mmol/L): 12.2 mmol/L

## 2020-08-09 LAB — URINALYSIS W MICROSCOPIC + REFLEX CULTURE
Bacteria, UA: NONE SEEN /HPF
Bilirubin Urine: NEGATIVE
Hgb urine dipstick: NEGATIVE
Hyaline Cast: NONE SEEN /LPF
Ketones, ur: NEGATIVE
Leukocyte Esterase: NEGATIVE
Nitrites, Initial: NEGATIVE
Protein, ur: NEGATIVE
RBC / HPF: NONE SEEN /HPF (ref 0–2)
Specific Gravity, Urine: 1.032 (ref 1.001–1.03)
Squamous Epithelial / HPF: NONE SEEN /HPF (ref <=5)
WBC, UA: NONE SEEN /HPF (ref 0–5)
pH: <=5 (ref 5.0–8.0)

## 2020-08-09 LAB — TSH: TSH: 1.62 mIU/L (ref 0.40–4.50)

## 2020-08-09 LAB — LIPID PANEL
Cholesterol: 195 mg/dL (ref <200)
HDL: 76 mg/dL (ref >=40)
LDL Cholesterol (Calc): 99 mg/dL (calc)
Non-HDL Cholesterol (Calc): 119 mg/dL (calc) (ref <130)
Total CHOL/HDL Ratio: 2.6 (calc) (ref <5.0)
Triglycerides: 106 mg/dL (ref <150)

## 2020-08-09 LAB — PSA: PSA: 2.91 ng/mL (ref <=4.0)

## 2020-08-16 ENCOUNTER — Other Ambulatory Visit: Payer: Self-pay | Admitting: Internal Medicine

## 2020-08-16 DIAGNOSIS — E782 Mixed hyperlipidemia: Secondary | ICD-10-CM

## 2020-09-07 ENCOUNTER — Encounter: Payer: Self-pay | Admitting: Internal Medicine

## 2020-10-05 ENCOUNTER — Other Ambulatory Visit: Payer: Self-pay | Admitting: *Deleted

## 2020-10-05 DIAGNOSIS — N182 Chronic kidney disease, stage 2 (mild): Secondary | ICD-10-CM

## 2020-10-05 DIAGNOSIS — E1122 Type 2 diabetes mellitus with diabetic chronic kidney disease: Secondary | ICD-10-CM

## 2020-10-05 MED ORDER — METFORMIN HCL ER 500 MG PO TB24: ORAL_TABLET | ORAL | 3 refills | Status: DC

## 2020-10-06 ENCOUNTER — Other Ambulatory Visit: Payer: Self-pay | Admitting: Adult Health Nurse Practitioner

## 2020-10-06 DIAGNOSIS — F900 Attention-deficit hyperactivity disorder, predominantly inattentive type: Secondary | ICD-10-CM

## 2020-10-06 MED ORDER — METHYLPHENIDATE HCL ER (OSM) 36 MG PO TBCR
EXTENDED_RELEASE_TABLET | ORAL | 0 refills | Status: DC
Start: 2020-10-06 — End: 2021-01-16

## 2020-10-10 ENCOUNTER — Telehealth: Payer: Self-pay | Admitting: *Deleted

## 2020-10-10 NOTE — Telephone Encounter (Signed)
Informed patient Joseph Campbell will cover Concerta # 61/22 days. [facility] ran his RX for #60 and patient advised RX will be ready for pickup

## 2020-11-25 ENCOUNTER — Ambulatory Visit: Payer: Managed Care, Other (non HMO) | Admitting: Adult Health

## 2020-12-27 NOTE — Progress Notes (Deleted)
FOLLOW UP  Assessment and Plan:   Hypertension Well controlled with current medications  Monitor blood pressure at home; patient to call if consistently greater than 130/80 Continue DASH diet.   Reminder to go to the ER if any CP, SOB, nausea, dizziness, severe HA, changes vision/speech, left arm numbness and tingling and jaw pain.  Cholesterol Currently above LDL goal <70; ? Need to increase *** rosuvastatin 20 mg daily  Continue low cholesterol diet and exercise.  Check lipid panel.   Diabetes with diabetic chronic kidney disease Continue medication: metformin, farxiga, glipizide - *** Continue with diet/exercise.  Continue diet and exercise.  Perform daily foot/skin check, notify office of any concerning changes.  Check A1C, CMP/GFR  Morbid obesity - BMI 36 with T2DM, htn, hld Long discussion about weight loss, diet, and exercise Recommended diet heavy in fruits and veggies and low in animal meats, cheeses, and dairy products, appropriate calorie intake Discussed ideal weight for height Patient will work on increasing exercise, watching diet - increase vegetables Will follow up in 3 months  Vitamin D Def At goal at last visit; continue supplementation to maintain goal of 70-100 Check Vit D level   ADD *** Continue medications Helps with focus, no AE's. The patient was counseled on the addictive nature of the medication and was encouraged to take drug holidays when not needed.    Continue diet and meds as discussed. Further disposition pending results of labs. Discussed med's effects and SE's.   Over 30 minutes of exam, counseling, chart review, and critical decision making was performed.   Future Appointments  Date Time Provider [place]  12/28/2020 10:30 AM [name], NP [facility] None  08/08/2021 10:00 AM Garnet Sierras, NP [facility] None     ----------------------------------------------------------------------------------------------------------------------  HPI 70 y.o. male  presents for 3 month follow up on hypertension, cholesterol, diabetes, morbid obesity, ADD and vitamin D deficiency.   Patient is on an ADD medication (concerta 36 mg x 2 tabs ***) he states that the medication is helping and he denies any adverse reactions. He currently takes daily - cannot focus otherwise, helps keep him on track and productive. He tries to ***  BMI is There is no height or weight on file to calculate BMI., he has been working on diet and exercise - he has been doing elliptical exercises daily, for 5-10 min at a time daily. Doing slim fast in the morning and sometimes during the day. Eats packs of tuna with veggies during the day as well.  Wt Readings from Last 3 Encounters:  08/08/20 227 lb (103 kg)  05/18/20 224 lb (101.6 kg)  04/14/20 219 lb (99.3 kg)   His blood pressure has been controlled at home, today their BP is    He does workout. He denies chest pain, shortness of breath, dizziness.   He is on cholesterol medication (crestor 20 mg daily - new ?) and denies myalgias. His cholesterol is not at goal. The cholesterol last visit was:   Lab Results  Component Value Date   CHOL 195 08/08/2020   HDL 76 08/08/2020   LDLCALC 99 08/08/2020   TRIG 106 08/08/2020   CHOLHDL 2.6 08/08/2020    He has been working on diet and exercise for T2 diabetes (on metformin ***, farxiga 10 mg daily *** and glipizide 5 mg TID) , and denies foot ulcerations, increased appetite, nausea, paresthesia of the feet, polydipsia, polyuria, visual disturbances, vomiting and weight loss.  He does currently check sugars runs 130-140  in the mornings.  Last A1C in the office was:  Lab Results  Component Value Date   HGBA1C 9.3 (H) 08/08/2020    He has CKD II associated with T2DM monitored at this office, last GFR:  Lab Results  Component Value Date    GFRNONAA 61 08/08/2020   Patient is on Vitamin D supplement and at goal at last check:    Lab Results  Component Value Date   VD25OH 82 04/14/2020        Current Medications:  Current Outpatient Medications on File Prior to Visit  Medication Sig  . aspirin 81 MG chewable tablet Chew by mouth daily.  . blood glucose meter kit and supplies KIT Dispense based on patient's  insurance preference, [employer]encia Elite-E11.22  . dapagliflozin propanediol (FARXIGA) 10 MG TABS tablet Take 1 tablet Daily for Diabetes  . FREESTYLE LITE test strip CHECK GLUCOSE THREE TIMES DAILY  . glipiZIDE (GLUCOTROL) 5 MG tablet Take    1 tablet    3 x /day    with meals for Diabetes  . ketoconazole (NIZORAL) 2 % cream Apply 1 application topically 2 (two) times daily.  . Lancets (FREESTYLE) lancets TEST BLOOD SUGAR THREE TIMES DAILY  . metFORMIN (GLUCOPHAGE-XR) 500 MG 24 hr tablet Take 2 tablets 2 x /day with Meals for Diabetes  . methylphenidate (CONCERTA) 36 MG PO CR tablet Take 1 to 2 tablets Daily if needed for Alertness.  More effective if not take daily.  Marland Kitchen olmesartan (BENICAR) 40 MG tablet Take 40 mg by mouth 2 (two) times daily.  . rosuvastatin (CRESTOR) 20 MG tablet TAKE 1 TABLET BY MOUTH DAILY FOR CHOLESTEROL  . sildenafil (VIAGRA) 100 MG tablet Take 1 tablet (100 mg total) by mouth as needed for erectile dysfunction.  . tadalafil (CIALIS) 20 MG tablet Take 1/2 to 1 tablet every 2 to 3 days as needed for XXXX  . Vitamin D, Ergocalciferol, (DRISDOL) 1.25 MG (50000 UNIT) CAPS capsule TAKE 1 CAPSULE BY MOUTH 4 TIMES EVERY WEEK   No current facility-administered medications on file prior to visit.     Allergies:  Allergies  Allergen Reactions  . Adderall [Amphetamine-Dextroamphet Er]     MOOD CHANGE  . Lipitor [Atorvastatin]     MYALGIA  . Other     FLU VACCINE  . Wellbutrin [Bupropion]     HA  . Zoloft [Sertraline Hcl]     AGGITATION     Medical History:  Past Medical History:  Diagnosis  Date  . ADD (attention deficit disorder)   . Anxiety   . Depression   . Diabetes mellitus without complication ([place])   . Hyperlipidemia   . Hypertension   . Unspecified vitamin D deficiency    Family history- Reviewed and unchanged Social history- Reviewed and unchanged   Review of Systems:  Review of Systems  Constitutional: Negative for malaise/fatigue and weight loss.  HENT: Negative for hearing loss and tinnitus.   Eyes: Negative for blurred vision and double vision.  Respiratory: Negative for cough, shortness of breath and wheezing.   Cardiovascular: Negative for chest pain, palpitations, orthopnea, claudication and leg swelling.  Gastrointestinal: Negative for abdominal pain, blood in stool, constipation, diarrhea, heartburn, melena, nausea and vomiting.  Genitourinary: Negative.   Musculoskeletal: Negative for joint pain and myalgias.  Skin: Negative for rash.  Neurological: Negative for dizziness, tingling, sensory change, weakness and headaches.  Endo/Heme/Allergies: Negative for polydipsia.  Psychiatric/Behavioral: Negative.   All other systems reviewed and are negative.  Physical Exam: There were no vitals taken for this visit. Wt Readings from Last 3 Encounters:  08/08/20 227 lb (103 kg)  05/18/20 224 lb (101.6 kg)  04/14/20 219 lb (99.3 kg)   General Appearance: Well nourished, in no apparent distress. Eyes: PERRLA, EOMs, conjunctiva no swelling or erythema Sinuses: No Frontal/maxillary tenderness ENT/Mouth: Ext aud canals clear, TMs without erythema, bulging. No erythema, swelling, or exudate on post pharynx.  Tonsils not swollen or erythematous. Hearing normal.  Neck: Supple, thyroid normal.  Respiratory: Respiratory effort normal, BS equal bilaterally without rales, rhonchi, wheezing or stridor.  Cardio: RRR with no MRGs. Brisk peripheral pulses without edema.  Abdomen: Soft, + BS.  Non tender, no guarding, rebound, hernias, masses. Lymphatics: Non tender  without lymphadenopathy.  Musculoskeletal: Full ROM, 5/5 strength, Normal gait Skin: Warm, dry without rashes, lesions, ecchymosis.  Neuro: Cranial nerves intact. No cerebellar symptoms.  Psych: Awake and oriented X 3, normal affect, Insight and Judgment appropriate.    Izora Ribas, NP 8:50 AM [facility]

## 2020-12-28 ENCOUNTER — Ambulatory Visit: Payer: Managed Care, Other (non HMO) | Admitting: Adult Health

## 2021-01-16 ENCOUNTER — Other Ambulatory Visit: Payer: Self-pay | Admitting: Internal Medicine

## 2021-01-16 DIAGNOSIS — F900 Attention-deficit hyperactivity disorder, predominantly inattentive type: Secondary | ICD-10-CM

## 2021-01-16 MED ORDER — METHYLPHENIDATE HCL ER (OSM) 36 MG PO TBCR: EXTENDED_RELEASE_TABLET | ORAL | 0 refills | Status: DC

## 2021-01-31 ENCOUNTER — Ambulatory Visit: Payer: Medicare HMO | Admitting: Adult Health

## 2021-02-17 ENCOUNTER — Other Ambulatory Visit: Payer: Self-pay | Admitting: Internal Medicine

## 2021-02-24 ENCOUNTER — Telehealth: Payer: Self-pay | Admitting: Adult Health

## 2021-02-24 ENCOUNTER — Other Ambulatory Visit: Payer: Self-pay | Admitting: Adult Health

## 2021-02-24 DIAGNOSIS — F900 Attention-deficit hyperactivity disorder, predominantly inattentive type: Secondary | ICD-10-CM

## 2021-02-24 MED ORDER — METHYLPHENIDATE HCL ER (OSM) 36 MG PO TBCR: EXTENDED_RELEASE_TABLET | ORAL | 0 refills | Status: DC

## 2021-02-24 NOTE — Progress Notes (Signed)
Future Appointments  Date Time Provider [place]  02/28/2021  8:45 AM [name], NP [facility] None  08/08/2021 10:00 AM Garnet Sierras, NP [facility] None    PDMP reviewed for concerta refill request.

## 2021-02-24 NOTE — Telephone Encounter (Signed)
Patient is calling and asking for a refill of Concerta TO [place] [id number], [place] [address] Phone: [phone] Fax: [phone]  [place] [id number], [place] - [address] [place] [address] Phone: [phone] Fax: [phone]    Recent Visits Date Type Provider Dept  08/08/20 Office Visit Garnet Sierras, NP [facility]  05/18/20 Office Visit Garnet Sierras, NP [facility]  04/14/20 Office Visit Vladimir Crofts, PA-C [facility]  01/08/20 Office Visit Unk Pinto, MD [place] recent visits within past 540 days with a meds authorizing provider and meeting all other requirements Future Appointments Date Type Provider Dept  02/28/21 Appointment [name], NP [facility]  Showing future appointments within next 150 days with a meds authorizing provider and meeting all other requirements

## 2021-02-26 ENCOUNTER — Other Ambulatory Visit: Payer: Self-pay | Admitting: Internal Medicine

## 2021-02-27 NOTE — Progress Notes (Signed)
FOLLOW UP  Assessment and Plan:   Hypertension Well controlled with current medications  Monitor blood pressure at home; patient to call if consistently greater than 130/80 Continue DASH diet.   Reminder to go to the ER if any CP, SOB, nausea, dizziness, severe HA, changes vision/speech, left arm numbness and tingling and jaw pain.  Cholesterol Currently above goal, has increased dose to rosuvastatin 40 mg LDL goal is 70, may add on zetia if persists above goal  Continue low cholesterol diet and exercise.  Check lipid panel.   Diabetes with diabetic chronic kidney disease Continue medication: metformin,  farxiga 10 mg Concern with hypoglycemia with glipizide, admittedly struggles with glucose checks. Discussed and started ozempic with first dose given in office. Check coverage with insurance -  Continue with diet/exercise.  Continue diet and exercise.  Perform daily foot/skin check, notify office of any concerning changes.  Check A1C  Obesity with co morbidities Long discussion about weight loss, diet, and exercise Recommended diet heavy in fruits and veggies and low in animal meats, cheeses, and dairy products, appropriate calorie intake Discussed ideal weight for height Will add ozempic and try to taper off of glipizide for weight loss benefits Will follow up in 3 months  Vitamin D Def At goal at last visit; continue supplementation to maintain goal of 70-100  ADD Continue medications Helps with focus, no AE's. The patient was counseled on the addictive nature of the medication and was encouraged to take drug holidays when not needed.    Continue diet and meds as discussed. Further disposition pending results of labs. Discussed med's effects and SE's.   Over 30 minutes of exam, counseling, chart review, and critical decision making was performed.   Future Appointments  Date Time Provider [place]  06/02/2021  9:00 AM [name], NP [facility] None   09/04/2021  9:00 AM Magda Bernheim, NP [facility] None    ----------------------------------------------------------------------------------------------------------------------  HPI 70 y.o. male  presents for 3 month follow up on hypertension, cholesterol, diabetes with CKD, morbid obesity, ADD and vitamin D deficiency.   Patient is on an ADD medication (concerta 36 mg x 2 tabs), he states that the medication is helping and he denies any adverse reactions. He estimates taking 5 day/week.   BMI is Body mass index is 35.9 kg/m., he has been working on diet and exercise. He is using bowflex machine, a few days a week. He reports tries to watch what he eats.  Wt Readings from Last 3 Encounters:  02/28/21 225 lb 12.8 oz (102.4 kg)  08/08/20 227 lb (103 kg)  05/18/20 224 lb (101.6 kg)   His blood pressure has been controlled at home, today their BP is BP: 110/70  He does workout. He denies chest pain, shortness of breath, dizziness.   He is on cholesterol medication (crestor 20 mg daily) and denies myalgias. His cholesterol is not at goal of less than 70. The cholesterol last visit was:   Lab Results  Component Value Date   CHOL 195 08/08/2020   HDL 76 08/08/2020   LDLCALC 99 08/08/2020   TRIG 106 08/08/2020   CHOLHDL 2.6 08/08/2020    He has been working on diet and exercise for T2 diabetes- has been increasing steadily since 08/2018 With CKD on ARB With hyperlipidemia not at goal of less than 70, on crestor 85m on metformin (he reports has been 1000 mg daily) glipizide 5 mg TID farxiga 10 mg added last visit.   He admits  forgets to check glucose,  but occasionally checks and running 150-160. He reports has had some episodes of "sweats."  denies foot ulcerations, increased appetite, nausea, paresthesia of the feet, polydipsia, polyuria, visual disturbances, vomiting and weight loss.   Last A1C in the office was:  Lab Results  Component Value Date   HGBA1C 9.3 (H) 08/08/2020   Lab  Results  Component Value Date   GFRNONAA 61 08/08/2020   Patient is on Vitamin D supplement, he reports taking 25000 IU 4 days a week.  Lab Results  Component Value Date   VD25OH 82 04/14/2020       Current Medications:   Current Outpatient Medications (Endocrine & Metabolic):    dapagliflozin propanediol (FARXIGA) 10 MG TABS tablet, Take 1 tablet Daily for Diabetes   metFORMIN (GLUCOPHAGE-XR) 500 MG 24 hr tablet, Take 2 tablets 2 x /day with Meals for Diabetes   Semaglutide,0.25 or 0.5MG/DOS, (OZEMPIC, 0.25 OR 0.5 MG/DOSE,) 2 MG/1.5ML SOPN, Start by injecting 0.25 mg into skin of stomach once weekly; if doing well increase to 0.5 mg in 4 weeks.  Current Outpatient Medications (Cardiovascular):    olmesartan (BENICAR) 40 MG tablet, Take 40 mg by mouth 2 (two) times daily.   rosuvastatin (CRESTOR) 20 MG tablet, TAKE 1 TABLET BY MOUTH DAILY FOR CHOLESTEROL   Current Outpatient Medications (Analgesics):    aspirin 81 MG chewable tablet, Chew by mouth daily.   Current Outpatient Medications (Other):    blood glucose meter kit and supplies KIT, Dispense based on patient's  insurance preference, [employer] Elite-E11.22   FREESTYLE LITE test strip, CHECK GLUCOSE THREE TIMES DAILY   ketoconazole (NIZORAL) 2 % cream, Apply 1 application topically 2 (two) times daily.   Lancets (FREESTYLE) lancets, TEST BLOOD SUGAR THREE TIMES DAILY   methylphenidate (CONCERTA) 36 MG PO CR tablet, Take 1 to 2 tablets Daily if needed for Alertness. Do not take daily, ideally <5 days per week. Script should last longer than 30 days.   Vitamin D, Ergocalciferol, (DRISDOL) 1.25 MG (50000 UNIT) CAPS capsule, TAKE 1 CAPSULE BY MOUTH 4 TIMES EVERY WEEK   Allergies:  Allergies  Allergen Reactions   Adderall [Amphetamine-Dextroamphet Er]     MOOD CHANGE   Lipitor [Atorvastatin]     MYALGIA   Other     FLU VACCINE   Wellbutrin [Bupropion]     HA   Zoloft [Sertraline Hcl]     AGGITATION     Medical  History:  Past Medical History:  Diagnosis Date   ADD (attention deficit disorder)    Anxiety    Depression    Diabetes mellitus without complication ([place])    Hyperlipidemia    Hypertension    Unspecified vitamin D deficiency    Family history- Reviewed and unchanged Social history- Reviewed and unchanged   Review of Systems:  Review of Systems  Constitutional:  Negative for malaise/fatigue and weight loss.  HENT:  Negative for hearing loss and tinnitus.   Eyes:  Negative for blurred vision and double vision.  Respiratory:  Negative for cough, shortness of breath and wheezing.   Cardiovascular:  Negative for chest pain, palpitations, orthopnea, claudication and leg swelling.  Gastrointestinal:  Negative for abdominal pain, blood in stool, constipation, diarrhea, heartburn, melena, nausea and vomiting.  Genitourinary: Negative.   Musculoskeletal:  Negative for joint pain and myalgias.  Skin:  Negative for rash.  Neurological:  Negative for dizziness, tingling, sensory change, weakness and headaches.  Endo/Heme/Allergies:  Negative for polydipsia.  Psychiatric/Behavioral: Negative.  All other systems reviewed and are negative.  Physical Exam: BP 110/70   Pulse 89   Temp 97.9 F (36.6 C)   Wt 225 lb 12.8 oz (102.4 kg)   SpO2 98%   BMI 35.90 kg/m  Wt Readings from Last 3 Encounters:  02/28/21 225 lb 12.8 oz (102.4 kg)  08/08/20 227 lb (103 kg)  05/18/20 224 lb (101.6 kg)   General Appearance: Well nourished, in no apparent distress. Eyes: PERRLA, EOMs, conjunctiva no swelling or erythema Sinuses: No Frontal/maxillary tenderness ENT/Mouth: Ext aud canals clear, TMs without erythema, bulging. Mouth and nose not examined- patient wearing a facemask  Hearing normal.  Neck: Supple, thyroid normal.  Respiratory: Respiratory effort normal, BS equal bilaterally without rales, rhonchi, wheezing or stridor.  Cardio: RRR with no MRGs. Brisk peripheral pulses without edema.   Abdomen: Soft, + BS. Obese abdomen. Non tender, no guarding, rebound, hernias, masses. Lymphatics: Non tender without lymphadenopathy.  Musculoskeletal: Full ROM, 5/5 strength, Normal gait Skin: Warm, dry without rashes, lesions, ecchymosis.  Neuro: Cranial nerves intact. No cerebellar symptoms.  Psych: Awake and oriented X 3, normal affect, Insight and Judgment appropriate.    Joseph Ribas, NP 1:06 PM [facility]

## 2021-02-28 ENCOUNTER — Encounter: Payer: Self-pay | Admitting: Adult Health

## 2021-02-28 ENCOUNTER — Other Ambulatory Visit: Payer: Self-pay

## 2021-02-28 ENCOUNTER — Ambulatory Visit (INDEPENDENT_AMBULATORY_CARE_PROVIDER_SITE_OTHER): Payer: Medicare HMO | Admitting: Adult Health

## 2021-02-28 VITALS — BP 110/70 | HR 89 | Temp 97.9°F | Wt 225.8 lb

## 2021-02-28 DIAGNOSIS — R69 Illness, unspecified: Secondary | ICD-10-CM | POA: Diagnosis not present

## 2021-02-28 DIAGNOSIS — E785 Hyperlipidemia, unspecified: Secondary | ICD-10-CM | POA: Diagnosis not present

## 2021-02-28 DIAGNOSIS — I10 Essential (primary) hypertension: Secondary | ICD-10-CM | POA: Diagnosis not present

## 2021-02-28 DIAGNOSIS — E1169 Type 2 diabetes mellitus with other specified complication: Secondary | ICD-10-CM

## 2021-02-28 DIAGNOSIS — F325 Major depressive disorder, single episode, in full remission: Secondary | ICD-10-CM

## 2021-02-28 DIAGNOSIS — E559 Vitamin D deficiency, unspecified: Secondary | ICD-10-CM

## 2021-02-28 DIAGNOSIS — Z79899 Other long term (current) drug therapy: Secondary | ICD-10-CM

## 2021-02-28 DIAGNOSIS — F900 Attention-deficit hyperactivity disorder, predominantly inattentive type: Secondary | ICD-10-CM

## 2021-02-28 DIAGNOSIS — E1122 Type 2 diabetes mellitus with diabetic chronic kidney disease: Secondary | ICD-10-CM

## 2021-02-28 DIAGNOSIS — N182 Chronic kidney disease, stage 2 (mild): Secondary | ICD-10-CM

## 2021-02-28 MED ORDER — OZEMPIC (0.25 OR 0.5 MG/DOSE) 2 MG/1.5ML ~~LOC~~ SOPN: PEN_INJECTOR | SUBCUTANEOUS | 0 refills | Status: DC

## 2021-02-28 NOTE — Patient Instructions (Signed)
Goals      HEMOGLOBIN A1C < 7.0     LDL CALC < 70     Weight (lb) < 200 lb (90.7 kg)       Goal is fasting blood sugar <130  Start ozempic  Continue glipizide but do 1 tab twice daily with breakfast and evening meal until your fasting sugars are <130, then reduce to 1 tab with largest meal until <130 then STOP  No other meds may cause low sugars. Can check fasting glucose just once or twice a week.   If you are doing well with 0.5 mg/week ozempic dose please contact me for refill of prescription to your pharmacy. We will continue 0.5 mg for 6 weeks, then contact me for 1 mg/week dose (this is a different pen).    Semaglutide injection solution What is this medication? SEMAGLUTIDE (Sem a GLOO tide) is used to improve blood sugar control in adults with type 2 diabetes. This medicine may be used with other diabetes medicines. This drug may also reduce the risk of heart attack or stroke if you have type 2diabetes and risk factors for heart disease. This medicine may be used for other purposes; ask your health care provider orpharmacist if you have questions. COMMON BRAND NAME(S): OZEMPIC What should I tell my care team before I take this medication? They need to know if you have any of these conditions: endocrine tumors (MEN 2) or if someone in your family had these tumors eye disease, vision problems history of pancreatitis kidney disease stomach problems thyroid cancer or if someone in your family had thyroid cancer an unusual or allergic reaction to semaglutide, other medicines, foods, dyes, or preservatives pregnant or trying to get pregnant breast-feeding How should I use this medication? This medicine is for injection under the skin of your upper leg (thigh), stomach area, or upper arm. It is given once every week (every 7 days). You will be taught how to prepare and give this medicine. Use exactly as directed. Take your medicine at regular intervals. Do not take it more often  thandirected. If you use this medicine with insulin, you should inject this medicine and the insulin separately. Do not mix them together. Do not give the injections rightnext to each other. Change (rotate) injection sites with each injection. It is important that you put your used needles and syringes in a special sharps container. Do not put them in a trash can. If you do not have a sharpscontainer, call your pharmacist or healthcare provider to get one. A special MedGuide will be given to you by the pharmacist with eachprescription and refill. Be sure to read this information carefully each time. This drug comes with INSTRUCTIONS FOR USE. Ask your pharmacist for directions on how to use this drug. Read the information carefully. Talk to yourpharmacist or health care provider if you have questions. Talk to your pediatrician regarding the use of this medicine in children.Special care may be needed. Overdosage: If you think you have taken too much of this medicine contact apoison control center or emergency room at once. NOTE: This medicine is only for you. Do not share this medicine with others. What if I miss a dose? If you miss a dose, take it as soon as you can within 5 days after the missed dose. Then take your next dose at your regular weekly time. If it has been longer than 5 days after the missed dose, do not take the missed dose. Take the next dose  at your regular time. Do not take double or extra doses. If you havequestions about a missed dose, contact your health care provider for advice. What may interact with this medication? other medicines for diabetes Many medications may cause changes in blood sugar, these include: alcohol containing beverages antiviral medicines for HIV or AIDS aspirin and aspirin-like drugs certain medicines for blood pressure, heart disease, irregular heart beat chromium diuretics male hormones, such as estrogens or progestins, birth control  pills fenofibrate gemfibrozil isoniazid lanreotide male hormones or anabolic steroids MAOIs like Carbex, Eldepryl, Marplan, Nardil, and Parnate medicines for weight loss medicines for allergies, asthma, cold, or cough medicines for depression, anxiety, or psychotic disturbances niacin nicotine NSAIDs, medicines for pain and inflammation, like ibuprofen or naproxen octreotide pasireotide pentamidine phenytoin probenecid quinolone antibiotics such as ciprofloxacin, levofloxacin, ofloxacin some herbal dietary supplements steroid medicines such as prednisone or cortisone sulfamethoxazole; trimethoprim thyroid hormones Some medications can hide the warning symptoms of low blood sugar (hypoglycemia). You may need to monitor your blood sugar more closely if youare taking one of these medications. These include: beta-blockers, often used for high blood pressure or heart problems (examples include atenolol, metoprolol, propranolol) clonidine guanethidine reserpine This list may not describe all possible interactions. Give your health care provider a list of all the medicines, herbs, non-prescription drugs, or dietary supplements you use. Also tell them if you smoke, drink alcohol, or use illegaldrugs. Some items may interact with your medicine. What should I watch for while using this medication? Visit your doctor or health care professional for regular checks on yourprogress. Drink plenty of fluids while taking this medicine. Check with your doctor or health care professional if you get an attack of severe diarrhea, nausea, and vomiting. The loss of too much body fluid can make it dangerous for you to takethis medicine. A test called the HbA1C (A1C) will be monitored. This is a simple blood test. It measures your blood sugar control over the last 2 to 3 months. You willreceive this test every 3 to 6 months. Learn how to check your blood sugar. Learn the symptoms of low and high bloodsugar  and how to manage them. Always carry a quick-source of sugar with you in case you have symptoms of low blood sugar. Examples include hard sugar candy or glucose tablets. Make sure others know that you can choke if you eat or drink when you develop serious symptoms of low blood sugar, such as seizures or unconsciousness. They must getmedical help at once. Tell your doctor or health care professional if you have high blood sugar. You might need to change the dose of your medicine. If you are sick or exercisingmore than usual, you might need to change the dose of your medicine. Do not skip meals. Ask your doctor or health care professional if you should avoid alcohol. Many nonprescription cough and cold products contain sugar oralcohol. These can affect blood sugar. Pens should never be shared. Even if the needle is changed, sharing may resultin passing of viruses like hepatitis or HIV. Wear a medical ID bracelet or chain, and carry a card that describes yourdisease and details of your medicine and dosage times. Do not become pregnant while taking this medicine. Women should inform their doctor if they wish to become pregnant or think they might be pregnant. There is a potential for serious side effects to an unborn child. Talk to your healthcare professional or pharmacist for more information. What side effects may I notice from receiving this medication?  Side effects that you should report to your doctor or health care professionalas soon as possible: allergic reactions like skin rash, itching or hives, swelling of the face, lips, or tongue breathing problems changes in vision diarrhea that continues or is severe lump or swelling on the neck severe nausea signs and symptoms of infection like fever or chills; cough; sore throat; pain or trouble passing urine signs and symptoms of low blood sugar such as feeling anxious, confusion, dizziness, increased hunger, unusually weak or tired, sweating,  shakiness, cold, irritable, headache, blurred vision, fast heartbeat, loss of consciousness signs and symptoms of kidney injury like trouble passing urine or change in the amount of urine trouble swallowing unusual stomach upset or pain vomiting Side effects that usually do not require medical attention (report to yourdoctor or health care professional if they continue or are bothersome): constipation diarrhea nausea pain, redness, or irritation at site where injected stomach upset This list may not describe all possible side effects. Call your doctor for medical advice about side effects. You may report side effects to FDA at1-800-FDA-1088. Where should I keep my medication? Keep out of the reach of children. Store unopened pens in a refrigerator between 2 and 8 degrees C (36 and 46 degrees F). Do not freeze. Protect from light and heat. After you first use the pen, it can be stored for 56 days at room temperature between 15 and 30 degrees C (59 and 86 degrees F) or in a refrigerator. Throw away your used pen after 56days or after the expiration date, whichever comes first. Do not store your pen with the needle attached. If the needle is left on,medicine may leak from the pen. NOTE: This sheet is a summary. It may not cover all possible information. If you have questions about this medicine, talk to your doctor, pharmacist, orhealth care provider.  2022 [employer]vier/Gold Standard (2019-03-31 09:41:51)

## 2021-03-01 ENCOUNTER — Other Ambulatory Visit: Payer: Self-pay | Admitting: Adult Health

## 2021-03-01 DIAGNOSIS — E782 Mixed hyperlipidemia: Secondary | ICD-10-CM

## 2021-03-01 DIAGNOSIS — N179 Acute kidney failure, unspecified: Secondary | ICD-10-CM

## 2021-03-01 LAB — CBC WITH DIFFERENTIAL/PLATELET
Absolute Monocytes: 983 cells/uL — ABNORMAL HIGH (ref 200–950)
Basophils Absolute: 53 cells/uL (ref 0–200)
Basophils Relative: 0.7 %
Eosinophils Absolute: 188 cells/uL (ref 15–500)
Eosinophils Relative: 2.5 %
HCT: 47 % (ref 38.5–50.0)
Hemoglobin: 15.8 g/dL (ref 13.2–17.1)
Lymphs Abs: 2168 cells/uL (ref 850–3900)
MCH: 31.3 pg (ref 27.0–33.0)
MCHC: 33.6 g/dL (ref 32.0–36.0)
MCV: 93.1 fL (ref 80.0–100.0)
MPV: 10.6 fL (ref 7.5–12.5)
Monocytes Relative: 13.1 %
Neutro Abs: 4110 cells/uL (ref 1500–7800)
Neutrophils Relative %: 54.8 %
Platelets: 351 10*3/uL (ref 140–400)
RBC: 5.05 10*6/uL (ref 4.20–5.80)
RDW: 12.6 % (ref 11.0–15.0)
Total Lymphocyte: 28.9 %
WBC: 7.5 10*3/uL (ref 3.8–10.8)

## 2021-03-01 LAB — COMPLETE METABOLIC PANEL WITH GFR
AG Ratio: 1.7 (calc) (ref 1.0–2.5)
ALT: 11 U/L (ref 9–46)
AST: 12 U/L (ref 10–35)
Albumin: 4.5 g/dL (ref 3.6–5.1)
Alkaline phosphatase (APISO): 73 U/L (ref 35–144)
BUN/Creatinine Ratio: 19 (calc) (ref 6–22)
BUN: 34 mg/dL — ABNORMAL HIGH (ref 7–25)
CO2: 23 mmol/L (ref 20–32)
Calcium: 9.7 mg/dL (ref 8.6–10.3)
Chloride: 101 mmol/L (ref 98–110)
Creat: 1.82 mg/dL — ABNORMAL HIGH (ref 0.70–1.28)
Globulin: 2.6 g/dL (calc) (ref 1.9–3.7)
Glucose, Bld: 188 mg/dL — ABNORMAL HIGH (ref 65–99)
Potassium: 4.5 mmol/L (ref 3.5–5.3)
Sodium: 136 mmol/L (ref 135–146)
Total Bilirubin: 0.4 mg/dL (ref 0.2–1.2)
Total Protein: 7.1 g/dL (ref 6.1–8.1)
eGFR: 39 mL/min/{1.73_m2} — ABNORMAL LOW (ref >=60)

## 2021-03-01 LAB — HEMOGLOBIN A1C
Hgb A1c MFr Bld: 9.4 % of total Hgb — ABNORMAL HIGH (ref <5.7)
Mean Plasma Glucose: 223 mg/dL
eAG (mmol/L): 12.4 mmol/L

## 2021-03-01 LAB — LIPID PANEL
Cholesterol: 272 mg/dL — ABNORMAL HIGH (ref <200)
HDL: 61 mg/dL (ref >=40)
LDL Cholesterol (Calc): 179 mg/dL (calc) — ABNORMAL HIGH
Non-HDL Cholesterol (Calc): 211 mg/dL (calc) — ABNORMAL HIGH (ref <130)
Total CHOL/HDL Ratio: 4.5 (calc) (ref <5.0)
Triglycerides: 167 mg/dL — ABNORMAL HIGH (ref <150)

## 2021-03-01 LAB — VITAMIN D 25 HYDROXY (VIT D DEFICIENCY, FRACTURES): Vit D, 25-Hydroxy: 58 ng/mL (ref 30–100)

## 2021-03-01 LAB — MAGNESIUM: Magnesium: 2.3 mg/dL (ref 1.5–2.5)

## 2021-03-01 LAB — TSH: TSH: 3.15 mIU/L (ref 0.40–4.50)

## 2021-03-01 MED ORDER — ROSUVASTATIN CALCIUM 40 MG PO TABS: ORAL_TABLET | ORAL | 3 refills | Status: DC

## 2021-03-09 ENCOUNTER — Other Ambulatory Visit: Payer: Self-pay | Admitting: Internal Medicine

## 2021-03-09 MED ORDER — OLMESARTAN MEDOXOMIL 40 MG PO TABS: ORAL_TABLET | ORAL | 3 refills | Status: DC

## 2021-03-21 ENCOUNTER — Other Ambulatory Visit: Payer: Self-pay

## 2021-03-21 ENCOUNTER — Other Ambulatory Visit: Payer: Medicare HMO

## 2021-03-21 DIAGNOSIS — N179 Acute kidney failure, unspecified: Secondary | ICD-10-CM

## 2021-03-21 DIAGNOSIS — E1122 Type 2 diabetes mellitus with diabetic chronic kidney disease: Secondary | ICD-10-CM | POA: Diagnosis not present

## 2021-03-21 DIAGNOSIS — N182 Chronic kidney disease, stage 2 (mild): Secondary | ICD-10-CM

## 2021-03-21 MED ORDER — OZEMPIC (0.25 OR 0.5 MG/DOSE) 2 MG/1.5ML ~~LOC~~ SOPN: PEN_INJECTOR | SUBCUTANEOUS | 0 refills | Status: DC

## 2021-03-22 ENCOUNTER — Other Ambulatory Visit: Payer: Self-pay | Admitting: Internal Medicine

## 2021-03-22 DIAGNOSIS — F900 Attention-deficit hyperactivity disorder, predominantly inattentive type: Secondary | ICD-10-CM

## 2021-03-22 LAB — URINALYSIS, ROUTINE W REFLEX MICROSCOPIC
Bilirubin Urine: NEGATIVE
Hgb urine dipstick: NEGATIVE
Ketones, ur: NEGATIVE
Leukocytes,Ua: NEGATIVE
Nitrite: NEGATIVE
Protein, ur: NEGATIVE
Specific Gravity, Urine: 1.014 (ref 1.001–1.035)
pH: 5.5 (ref 5.0–8.0)

## 2021-03-22 LAB — BASIC METABOLIC PANEL WITH GFR
BUN/Creatinine Ratio: 16 (calc) (ref 6–22)
BUN: 22 mg/dL (ref 7–25)
CO2: 27 mmol/L (ref 20–32)
Calcium: 9.6 mg/dL (ref 8.6–10.3)
Chloride: 100 mmol/L (ref 98–110)
Creat: 1.38 mg/dL — ABNORMAL HIGH (ref 0.70–1.28)
Glucose, Bld: 104 mg/dL — ABNORMAL HIGH (ref 65–99)
Potassium: 4.8 mmol/L (ref 3.5–5.3)
Sodium: 136 mmol/L (ref 135–146)
eGFR: 54 mL/min/{1.73_m2} — ABNORMAL LOW (ref >=60)

## 2021-03-27 ENCOUNTER — Other Ambulatory Visit: Payer: Self-pay | Admitting: Adult Health

## 2021-03-27 ENCOUNTER — Other Ambulatory Visit: Payer: Self-pay

## 2021-03-27 DIAGNOSIS — F900 Attention-deficit hyperactivity disorder, predominantly inattentive type: Secondary | ICD-10-CM

## 2021-03-27 DIAGNOSIS — N182 Chronic kidney disease, stage 2 (mild): Secondary | ICD-10-CM

## 2021-03-27 DIAGNOSIS — E1122 Type 2 diabetes mellitus with diabetic chronic kidney disease: Secondary | ICD-10-CM

## 2021-03-27 MED ORDER — OZEMPIC (0.25 OR 0.5 MG/DOSE) 2 MG/1.5ML ~~LOC~~ SOPN: PEN_INJECTOR | SUBCUTANEOUS | 0 refills | Status: DC

## 2021-03-30 ENCOUNTER — Other Ambulatory Visit: Payer: Self-pay | Admitting: Adult Health

## 2021-03-30 ENCOUNTER — Telehealth: Payer: Self-pay

## 2021-03-30 DIAGNOSIS — F900 Attention-deficit hyperactivity disorder, predominantly inattentive type: Secondary | ICD-10-CM

## 2021-03-30 MED ORDER — METHYLPHENIDATE HCL ER (OSM) 72 MG PO TBCR: EXTENDED_RELEASE_TABLET | ORAL | 0 refills | Status: DC

## 2021-03-30 NOTE — Telephone Encounter (Signed)
Concerta: Patient states that his insurance company will not cover 2 tablets a day. Only once a day. However, insurance will cover '72mg'$  daily. Requesting a new prescription be sent in for '72mg'$ , extended release instead.

## 2021-04-29 ENCOUNTER — Other Ambulatory Visit: Payer: Self-pay | Admitting: Adult Health

## 2021-04-29 ENCOUNTER — Other Ambulatory Visit: Payer: Self-pay | Admitting: Internal Medicine

## 2021-04-29 DIAGNOSIS — N182 Chronic kidney disease, stage 2 (mild): Secondary | ICD-10-CM

## 2021-04-29 DIAGNOSIS — E1122 Type 2 diabetes mellitus with diabetic chronic kidney disease: Secondary | ICD-10-CM

## 2021-04-29 MED ORDER — OZEMPIC (1 MG/DOSE) 4 MG/3ML ~~LOC~~ SOPN: PEN_INJECTOR | SUBCUTANEOUS | 0 refills | Status: DC

## 2021-05-08 ENCOUNTER — Other Ambulatory Visit: Payer: Self-pay

## 2021-05-08 ENCOUNTER — Telehealth: Payer: Self-pay

## 2021-05-08 DIAGNOSIS — E1122 Type 2 diabetes mellitus with diabetic chronic kidney disease: Secondary | ICD-10-CM

## 2021-05-08 MED ORDER — OZEMPIC (0.25 OR 0.5 MG/DOSE) 2 MG/1.5ML ~~LOC~~ SOPN: 0.5000 mg | PEN_INJECTOR | SUBCUTANEOUS | 1 refills | Status: DC

## 2021-05-08 NOTE — Telephone Encounter (Signed)
Patient called and said he went from 0.5mg  to 1mg  of Ozempic and the cost went way up. He was paying $47 and now it went to $726.00. He said his sugars were good at the .05mg  dose. Can he stay at that?

## 2021-05-08 NOTE — Telephone Encounter (Signed)
Yes just refill the 0.5

## 2021-05-23 ENCOUNTER — Telehealth: Payer: Self-pay

## 2021-05-23 ENCOUNTER — Other Ambulatory Visit: Payer: Self-pay | Admitting: Adult Health

## 2021-05-23 DIAGNOSIS — F900 Attention-deficit hyperactivity disorder, predominantly inattentive type: Secondary | ICD-10-CM

## 2021-05-23 MED ORDER — METHYLPHENIDATE HCL ER (OSM) 72 MG PO TBCR: EXTENDED_RELEASE_TABLET | ORAL | 0 refills | Status: DC

## 2021-05-23 NOTE — Telephone Encounter (Signed)
Refill request for Methylphenidate, 72mg .

## 2021-05-23 NOTE — Progress Notes (Signed)
Future Appointments  Date Time Provider [place]  06/02/2021  9:00 AM Magda Bernheim, NP [facility] None  09/04/2021  9:00 AM Magda Bernheim, NP [facility] None   PDMP reviewed for methylphenidate refill request.

## 2021-05-30 NOTE — Progress Notes (Deleted)
Medicare Annual Wellness Visit   Assessment and Plan:   Joseph Campbell was seen today for follow-up.  Diagnoses and all orders for this visit:  Essential hypertension Continue current medications: Olmesartan 67m BID Monitor blood pressure at home; call if consistently over 130/80 Continue DASH diet.   Reminder to go to the ER if any CP, SOB, nausea, dizziness, severe HA, changes vision/speech, left arm numbness and tingling and jaw pain.  CKD stage 3 due to type 2 diabetes mellitus (HCC) Continue 64-80 oz of fluid daily Continue glucose monitoring Continue medications Increase fluids Avoid NSAIDS Discussed dietary modifications and exercise Will continue to monitor -     COMPLETE METABOLIC PANEL WITH GFR  Type 2 diabetes mellitus with stage 2 chronic kidney disease, without long-term current use of insulin (HCC) Continue medications: Metformin 5086mtwo tablets twice a day. Glipizide 95m61mTID, Farxiga 67m19mily in am. Completed DOT physical, consider GLP1? Discussed general issues about diabetes pathophysiology and management. Education: Reviewed 'ABCs' of diabetes management (respective goals in parentheses):  A1C (<7), blood pressure (<130/80), and cholesterol (LDL <70) Dietary recommendations Encouraged aerobic exercise.  Discussed foot care, check daily Yearly retinal exam Dental exam every 6 months Monitor blood glucose, discussed goal for patient   Attention deficit hyperactivity disorder (ADHD), predominantly inattentive type  ADD -  Continue ADD medication, helps with focus, no AE's. The patient was counseled on the addictive nature of the medication and was encouraged to take drug holidays when not needed.  -     methylphenidate (CONCERTA) 36 MG PO CR tablet; Take 1 to 2 tablets   Hyperlipidemia associated with type 2 diabetes mellitus (HCC) Continue medications: rosuvastatin 20mg48mly Discussed dietary and exercise modifications Low fat diet -     Lipid  panel  Attention deficit hyperactivity disorder (ADHD), predominantly inattentive type -     methylphenidate (CONCERTA) 36 MG PO CR tablet; Take 1 to 2 tablets Daily if needed for Alertness.  More effective if not take daily.  Depression, major, in remission (HCC) Peak Placemedications Continue medications:  Discussed stress management techniques  Discussed, increase water,intake & good sleep hygiene  Discussed increasing exercise & vegetables in diet  Vitamin D deficiency Continue supplementation to maintain goal of 70-100 No supplement Defer vitamin D level  Morbid obesity (BMI 36.41) Discussed dietary and exercise modifications  Testosterone deficiency No supplementation at this time  BPH with obstruction/lower urinary tract symptoms Doing well at this time Will continue to monitor Defer PSA this check  Medication management Continues    r      Continue diet and meds as discussed. Further disposition pending results of labs. Discussed med's effects and SE's.    Further disposition pending results if labs check today. Discussed med's effects and SE's.   Over 30 minutes of face to face interview, exam, counseling, chart review, and critical decision making was performed.    Future Appointments  Date Time Provider [place]arG[place]4/2022  9:00 AM Joseph Campbell,Magda BernheimGAAM-GAAIM None  09/04/2021  9:00 AM Joseph Campbell,Magda BernheimGAAM-GAAIM None    Plan:   During the course of the visit the patient was educated and counseled about appropriate screening and preventive services including:   Pneumococcal vaccine  Prevnar 13 Influenza vaccine Td vaccine Screening electrocardiogram Bone densitometry screening Colorectal cancer screening Diabetes screening Glaucoma screening Nutrition counseling  Advanced directives: requested  ----------------------------------------------------------------------------------------------------------------------  HPI 70 y.37 male  presents  for 3 month follow up on labs for elevated  Cre 1.66 (1.38) (1.24), GFR decreased 41 (51) (59), HTN and DMII. He also has history of  morbid obesity, ADD and vitamin D deficiency.   Denies any health or medication concerns today.  Report he drinks out of a 20oz water bottle and he drinks about 6 or more of these a day. He worked on increasing this after the last appointment.  Patient is on an ADD medication (concerta 36 mg x 2 tabs) he states that the medication is helping and he denies any adverse reactions. He is in need of a refill today.   BMI is There is no height or weight on file to calculate BMI., he has been working on diet and exercise. Wt Readings from Last 3 Encounters:  02/28/21 225 lb 12.8 oz (102.4 kg)  08/08/20 227 lb (103 kg)  05/18/20 224 lb (101.6 kg)   His blood pressure has been controlled at home, today their BP is    He does workout. He denies chest pain, shortness of breath, dizziness.  Pulse is elevated today, reports he was rushing to get here.   He is on cholesterol medication (crestor 20 mg daily) and denies myalgias. His cholesterol is not at goal of less than 70. The cholesterol last visit was:   Lab Results  Component Value Date   CHOL 272 (H) 02/28/2021   HDL 61 02/28/2021   LDLCALC 179 (H) 02/28/2021   TRIG 167 (H) 02/28/2021   CHOLHDL 4.5 02/28/2021    He has been working on diet and exercise for DMII- has been increasing steadily since 08/2018 With CKD on ARB With hyperlipidemia not at goal of less than 70, on crestor 55m on metformin and glipizide 5 mg TID, with farxiga 151madded last visit.   He is checking sugars 2-3 x a day and as needed. Fasting range is 115-130's. denies foot ulcerations, increased appetite, nausea, paresthesia of the feet, polydipsia, polyuria, visual disturbances, vomiting and weight loss.    Last A1C in the office improved from 12.7 to:  Lab Results  Component Value Date   HGBA1C 9.4 (H) 02/28/2021   Lab Results   Component Value Date   GFRNONAA 61 08/08/2020   Patient is on Vitamin D supplement, on 50,000 4 x a week, over goal last visit Lab Results  Component Value Date   VD25OH 58 02/28/2021        Current Medications:   Current Outpatient Medications (Endocrine & Metabolic):    dapagliflozin propanediol (FARXIGA) 10 MG TABS tablet, Take 1 tablet Daily for Diabetes   metFORMIN (GLUCOPHAGE-XR) 500 MG 24 hr tablet, Take 2 tablets 2 x /day with Meals for Diabetes   Semaglutide, 1 MG/DOSE, (OZEMPIC, 1 MG/DOSE,) 4 MG/3ML SOPN, Inject 1 mg (0.75 ml)   into the Skin   Once Weekly   for Diabetes   Semaglutide,0.25 or 0.5MG/DOS, (OZEMPIC, 0.25 OR 0.5 MG/DOSE,) 2 MG/1.5ML SOPN, Inject 0.5 mg into the skin once a week.  Current Outpatient Medications (Cardiovascular):    olmesartan (BENICAR) 40 MG tablet, Take  1 tablet  Daily  for BP & Diabetic Kidney Protection / Patient knows to take by mouth   rosuvastatin (CRESTOR) 40 MG tablet, TAKE 1 TABLET BY MOUTH DAILY FOR CHOLESTEROL   Current Outpatient Medications (Analgesics):    aspirin 81 MG chewable tablet, Chew by mouth daily.   Current Outpatient Medications (Other):    blood glucose meter kit and supplies KIT, Dispense based on patient's  insurance preference, [employer] Elite-E11.22   FREESTYLE  LITE test strip, CHECK GLUCOSE THREE TIMES DAILY   ketoconazole (NIZORAL) 2 % cream, Apply 1 application topically 2 (two) times daily.   Lancets (FREESTYLE) lancets, TEST BLOOD SUGAR THREE TIMES DAILY   Methylphenidate HCl ER, OSM, 72 MG TBCR, TAKE 1 TABLET BY MOUTH DAILY IF NEEDED FOR ALERTNESS. DO NOT TAKE DAILY. IDEALLY<5 DAYS PER WEEK   Vitamin D, Ergocalciferol, (DRISDOL) 1.25 MG (50000 UNIT) CAPS capsule, TAKE 1 CAPSULE BY MOUTH 4 TIMES EVERY WEEK   Allergies:  Allergies  Allergen Reactions   Adderall [Amphetamine-Dextroamphet Er]     MOOD CHANGE   Lipitor [Atorvastatin]     MYALGIA   Other     FLU VACCINE   Wellbutrin [Bupropion]      HA   Zoloft [Sertraline Hcl]     AGGITATION     Medical History:  Past Medical History:  Diagnosis Date   ADD (attention deficit disorder)    Anxiety    Depression    Diabetes mellitus without complication ([place])    Hyperlipidemia    Hypertension    Unspecified vitamin D deficiency    Family history- Reviewed and unchanged Social history- Reviewed and unchanged   Names of Other Physician/Practitioners you currently use: 1. [facility] here for primary care 2. Eye Exam, DUE 3.Dental exam: DUE Patient Care Team: Unk Pinto, MD as PCP - General (Internal Medicine)   MEDICARE WELLNESS OBJECTIVES: Physical activity:   Cardiac risk factors:   Depression/mood screen:   Depression screen [facility] 2/9 01/08/2020  Decreased Interest 0  Down, Depressed, Hopeless 0  PHQ - 2 Score 0    ADLs:  No flowsheet data found.   Cognitive Testing  Alert? Yes  Normal Appearance?Yes  Oriented to person? Yes  Place? Yes   Time? Yes  Recall of three objects?  Yes  Can perform simple calculations? Yes  Displays appropriate judgment?Yes  Can read the correct time from a watch face?Yes  EOL planning:      Screening Tests: Immunization History  Administered Date(s) Administered   DT (Pediatric) 09/16/2014   PPD Test 09/16/2014, 10/28/2015   Pneumococcal-Unspecified 07/31/1999   Td 07/31/2003   Zoster, Live 05/26/2013    Preventative care: Last colonoscopy: 05/2019   Vaccinations: TD or Tdap: 2016  Influenza: Declines Pneumococcal: Declines Prevnar13: Dclines Zoster 2014 Shingles/Zostavax: Discussed SARS-COV2-Declines   Review of Systems:  Review of Systems  Constitutional:  Negative for chills, fever, malaise/fatigue and weight loss.  HENT:  Negative for congestion, hearing loss and tinnitus.   Eyes:  Negative for blurred vision and double vision.  Respiratory:  Negative for cough, shortness of breath and wheezing.    Cardiovascular:  Negative for chest pain, palpitations, orthopnea, claudication and leg swelling.  Gastrointestinal:  Negative for abdominal pain, blood in stool, constipation, diarrhea, heartburn, melena, nausea and vomiting.  Genitourinary: Negative.   Musculoskeletal:  Negative for falls, joint pain and myalgias.  Skin:  Negative for rash.  Neurological:  Negative for dizziness, tingling, tremors, sensory change, loss of consciousness, weakness and headaches.  Endo/Heme/Allergies:  Negative for polydipsia.  Psychiatric/Behavioral: Negative.  Negative for depression, memory loss and suicidal ideas.   All other systems reviewed and are negative.  Physical Exam: There were no vitals taken for this visit. Wt Readings from Last 3 Encounters:  02/28/21 225 lb 12.8 oz (102.4 kg)  08/08/20 227 lb (103 kg)  05/18/20 224 lb (101.6 kg)   General Appearance: Well nourished, in no apparent distress. Eyes: PERRLA,  EOMs, conjunctiva no swelling or erythema Sinuses: No Frontal/maxillary tenderness ENT/Mouth: Ext aud canals clear, TMs without erythema, bulging. Mouth and nose not examined- patient wearing a facemask  Hearing normal.  Neck: Supple, thyroid normal.  Respiratory: Respiratory effort normal, BS equal bilaterally without rales, rhonchi, wheezing or stridor.  Cardio: RRR with no MRGs. Brisk peripheral pulses without edema.  Abdomen: Soft distended, + BS. Obese Non tender, no guarding, rebound, hernias, masses. Lymphatics: Non tender without lymphadenopathy.  Musculoskeletal: Full ROM, 5/5 strength, Normal gait Skin: Warm, dry flaky, without rashes, lesions, ecchymosis.  Neuro: Cranial nerves intact. No cerebellar symptoms.  Psych: Awake and oriented X 3, normal affect, Insight and Judgment appropriate.    Medicare Attestation I have personally reviewed: The patient's medical and social history Their use of alcohol, tobacco or illicit drugs Their current medications and  supplements The patient's functional ability including ADLs,fall risks, home safety risks, cognitive, and hearing and visual impairment Diet and physical activities Evidence for depression or mood disorders  The patient's weight, height, BMI, and visual acuity have been recorded in the chart.  I have made referrals, counseling, and provided education to the patient based on review of the above and I have provided the patient with a written personalized care plan for preventive services.     Magda Bernheim ANP-C  [facility]  05/30/2021

## 2021-06-02 ENCOUNTER — Ambulatory Visit: Payer: Medicare HMO | Admitting: Nurse Practitioner

## 2021-06-02 DIAGNOSIS — Z0000 Encounter for general adult medical examination without abnormal findings: Secondary | ICD-10-CM

## 2021-06-02 DIAGNOSIS — I10 Essential (primary) hypertension: Secondary | ICD-10-CM

## 2021-06-02 DIAGNOSIS — Z79899 Other long term (current) drug therapy: Secondary | ICD-10-CM

## 2021-06-02 DIAGNOSIS — N183 Type 2 diabetes mellitus with diabetic chronic kidney disease: Secondary | ICD-10-CM

## 2021-06-02 DIAGNOSIS — E1169 Type 2 diabetes mellitus with other specified complication: Secondary | ICD-10-CM

## 2021-06-02 DIAGNOSIS — E559 Vitamin D deficiency, unspecified: Secondary | ICD-10-CM

## 2021-06-02 DIAGNOSIS — E349 Endocrine disorder, unspecified: Secondary | ICD-10-CM

## 2021-06-02 DIAGNOSIS — F325 Major depressive disorder, single episode, in full remission: Secondary | ICD-10-CM

## 2021-06-02 DIAGNOSIS — F900 Attention-deficit hyperactivity disorder, predominantly inattentive type: Secondary | ICD-10-CM

## 2021-06-02 DIAGNOSIS — N182 Chronic kidney disease, stage 2 (mild): Secondary | ICD-10-CM

## 2021-06-13 NOTE — Progress Notes (Signed)
Joseph Campbell and FOLLOW UP   Assessment and Plan:   Joseph Campbell was seen today for follow-up.  Diagnoses and all orders for this visit:  Annual Medicare Wellness Visit Due annually  Health maintenance reviewed Declines all vaccines today Advanced directive/living will paperwork given   Essential hypertension Continue current medications: Olmesartan 109m  Monitor blood pressure at home; call if consistently over 130/80 Continue DASH diet.   Reminder to go to the ER if any CP, SOB, nausea, dizziness, severe HA, changes vision/speech, left arm numbness and tingling and jaw pain.  CKD stage 3 due to type 2 diabetes mellitus (HCC) Continue 64-80 oz of fluid daily Continue glucose monitoring Continue medications Increase fluids Avoid NSAIDS Discussed dietary modifications and exercise Will continue to monitor -     COMPLETE METABOLIC PANEL WITH GFR  Type 2 diabetes mellitus with stage 2 chronic kidney disease, without long-term current use of insulin (HCC) Continue medications: farxiga, ozempic - does take leftover glipizide PRN occasionally, declines metformin at this time Given ozempic 1 mg samples due to cost, complete assistance paperwork once qualifies in 2023 Discussed general issues about diabetes pathophysiology and management. Education: Reviewed 'ABCs' of diabetes management (respective goals in parentheses):  A1C (<7), blood pressure (<130/80), and cholesterol (LDL <70) Dietary recommendations Encouraged aerobic exercise.  Discussed foot care, check daily Yearly retinal exam - forward report, has number to schedule Dental exam every 6 months Monitor blood glucose, discussed goal for patient  Hyperlipidemia associated with type 2 diabetes mellitus ([place] Continue medications: rosuvastatin 454mdaily, compliance encouraged, risks and benefits discussed Discussed dietary and exercise modifications Low fat diet -     Lipid panel  Attention deficit hyperactivity disorder (ADHD),  predominantly inattentive type  ADD -  Continue ADD medication, helps with focus, no AE's. The patient was counseled on the addictive nature of the medication and was encouraged to take drug holidays when not needed.   Depression, major, in remission ([place] medications  Discussed stress management techniques  Discussed, increase water,intake & good sleep hygiene  Discussed increasing exercise & vegetables in diet  Vitamin D deficiency Continue supplementation to maintain goal of 70-100 No supplement Defer vitamin D level  Obesity - BM 34 Commended progress, continue ozempic Discussed dietary and exercise modifications  Medication management Continue each visit  History of colon polyps Overdue for follow up, GI referral placed   Continue diet and meds as discussed. Further disposition pending results of labs. Discussed med's effects and SE's.    Further disposition pending results if labs check today. Discussed med's effects and SE's.   Over 30 minutes of face to face interview, exam, counseling, chart review, and critical decision making was performed.    Future Appointments  Date Time Provider [place]20[place]  9:00 AM CoLiane ComberNP [facility] None  06/14/2022  9:00 AM CoLiane ComberNP [facility] None     Plan:   During the course of the visit the patient was educated and counseled about appropriate screening and preventive services including:   Pneumococcal vaccine  Prevnar 13 Influenza vaccine Td vaccine Screening electrocardiogram Bone densitometry screening Colorectal cancer screening Diabetes screening Glaucoma screening Nutrition counseling  Advanced directives: requested   ----------------------------------------------------------------------------------------------------------------------  [name] male  presents for 3 month follow up and Joseph Campbell. He has ADD (attention deficit disorder); Vitamin D deficiency; Hyperlipidemia  associated with type 2 diabetes mellitus ([place] Hypertension; Depression, major, in remission ([place] [id number]/renal complication ([place] Medication management; Obesity (BMI 30.0-34.9); CKD stage 2  due to type 2 diabetes mellitus ([place]); and History of colon polyps on their problem list.  Patient is on an ADD medication (methylphenidate 72 mg daily), he states that the medication is helping and he denies any adverse reactions. He estimates taking 5 day/week.   BMI is Body mass index is 34.15 kg/m., he has been working on diet, admits not enough exercise, dose elliptical 10 min twice weekly. Weight down since on ozempic. His goal is 160s.  Wt Readings from Last 3 Encounters:  06/14/21 214 lb 12.8 oz (97.4 kg)  02/28/21 225 lb 12.8 oz (102.4 kg)  08/08/20 227 lb (103 kg)   His blood pressure has been controlled at home, today their BP is BP: 112/72  He does workout. He denies chest pain, shortness of breath, dizziness.  Pulse is elevated today, reports he was rushing to get here.   He is on cholesterol medication (crestor 40 mg daily but admits forgets, estimates taking 50% of the time ) and denies myalgias. His cholesterol is not at goal of less than 70. The cholesterol last visit was:   Lab Results  Component Value Date   CHOL 272 (H) 02/28/2021   HDL 61 02/28/2021   LDLCALC 179 (H) 02/28/2021   TRIG 167 (H) 02/28/2021   CHOLHDL 4.5 02/28/2021    He has been working on diet and exercise for DMII- has been increasing steadily since 08/2018 With CKD on ARB With hyperlipidemia not at goal of less than 70, on crestor 72m on metformin - not currently taking, causes very frequent loose bowels Farxiga 10 mg daily  Newly on ozempic taking 1 mg/week He is checking sugars 2-3 x a day and as needed. Fasting range is 88-110s. denies foot ulcerations, increased appetite, nausea, paresthesia of the feet, polydipsia, polyuria, visual disturbances, vomiting and weight loss.   Last A1C in the office  improved from 12.7 to:  Lab Results  Component Value Date   HGBA1C 9.4 (H) 02/28/2021   He has CKD II, on olmesartan:  Lab Results  Component Value Date   GFRNONAA 61 08/08/2020   Patient is on Vitamin D supplement, on 50,000 IU but states hasn't taken in quite some time. Taking 8000 IU every other day.  Lab Results  Component Value Date   VD25OH 58 02/28/2021        Current Medications:   Current Outpatient Medications (Endocrine & Metabolic):    dapagliflozin propanediol (FARXIGA) 10 MG TABS tablet, Take 1 tablet Daily for Diabetes   Semaglutide,0.25 or 0.5MG/DOS, (OZEMPIC, 0.25 OR 0.5 MG/DOSE,) 2 MG/1.5ML SOPN, Inject 0.5 mg into the skin once a week.   Semaglutide, 1 MG/DOSE, (OZEMPIC, 1 MG/DOSE,) 4 MG/3ML SOPN, Inject 1 mg (0.75 ml)   into the Skin   Once Weekly   for Diabetes (Patient not taking: Reported on 06/14/2021)  Current Outpatient Medications (Cardiovascular):    olmesartan (BENICAR) 40 MG tablet, Take  1 tablet  Daily  for BP & Diabetic Kidney Protection / Patient knows to take by mouth   rosuvastatin (CRESTOR) 40 MG tablet, TAKE 1 TABLET BY MOUTH DAILY FOR CHOLESTEROL   Current Outpatient Medications (Analgesics):    aspirin 81 MG chewable tablet, Chew by mouth daily.   Current Outpatient Medications (Other):    blood glucose meter kit and supplies KIT, Dispense based on patient's  insurance preference, [employer] Elite-E11.22   Cholecalciferol 50 MCG (2000 UT) CAPS, Take 8,000 Units by mouth every other day.   FREESTYLE LITE test strip, CHECK  GLUCOSE THREE TIMES DAILY   ketoconazole (NIZORAL) 2 % cream, Apply 1 application topically 2 (two) times daily.   Lancets (FREESTYLE) lancets, TEST BLOOD SUGAR THREE TIMES DAILY   Methylphenidate HCl ER, OSM, 72 MG TBCR, TAKE 1 TABLET BY MOUTH DAILY IF NEEDED FOR ALERTNESS. DO NOT TAKE DAILY. IDEALLY<5 DAYS PER WEEK   Allergies:  Allergies  Allergen Reactions   Adderall [Amphetamine-Dextroamphet Er]     MOOD CHANGE    Lipitor [Atorvastatin]     MYALGIA   Other     FLU VACCINE   Wellbutrin [Bupropion]     HA   Zoloft [Sertraline Hcl]     AGGITATION     Medical History:  Past Medical History:  Diagnosis Date   ADD (attention deficit disorder)    Anxiety    Depression    Diabetes mellitus without complication ([place])    Hyperlipidemia    Hypertension    Unspecified vitamin D deficiency    Allergies:  Allergies  Allergen Reactions   Adderall [Amphetamine-Dextroamphet Er]     MOOD CHANGE   Lipitor [Atorvastatin]     MYALGIA   Other     FLU VACCINE   Wellbutrin [Bupropion]     HA   Zoloft [Sertraline Hcl]     AGGITATION   Surgical History:  He  has a past surgical history that includes Colonoscopy and Polypectomy. Family History:  Hisfamily history includes Cancer in his mother; Hyperlipidemia in his sister; Hypertension in his father, mother, and sister. Social History:   reports that he quit smoking about 15 years ago. His smoking use included cigars. His smokeless tobacco use includes chew. He reports current alcohol use of about 7.0 standard drinks per week. He reports that he does not use drugs.   Screening Tests: Immunization History  Administered Date(s) Administered   DT (Pediatric) 09/16/2014   PPD Test 09/16/2014, 10/28/2015   Pneumococcal-Unspecified 07/31/1999   Td 07/31/2003   Zoster, Live 05/26/2013    Preventative care: Last colonoscopy: 05/2019, 7 polyps, Dr. Henrene Pastor   TD or Tdap: 2016  Influenza: Declines Pneumococcal: would consider 20 valent, discuss next visit  Zoster 2014 Shingles/Zostavax: Discussed Covid 19 -2/2 2021, report requested  Names of Other Physician/Practitioners you currently use: 1. [facility] here for primary care 2. Eye Exam, DUE, has number to schedule from insurance  3.Dental exam: Dr. Gloriann Loan, DUE  Patient Care Team: Unk Pinto, MD as PCP - General (Internal Medicine)  MEDICARE  WELLNESS OBJECTIVES: Physical activity: Current Exercise Habits: Home exercise routine, Type of exercise: treadmill, Time (Minutes): 10, Frequency (Times/Week): 2, Weekly Exercise (Minutes/Week): 20, Intensity: Mild, Exercise limited by: None identified Cardiac risk factors: Cardiac Risk Factors include: advanced age (>80mn, >>61women);dyslipidemia;hypertension;obesity (BMI >30kg/m2);diabetes mellitus;male gender;smoking/ tobacco exposure Depression/mood screen:   [facility] 06/14/2021  Decreased Interest 0  Down, Depressed, Hopeless 1  PHQ - 2 Score 1    ADLs:  In your present state of health, do you have any difficulty performing the following activities: 06/14/2021  Hearing? N  Vision? N  Difficulty concentrating or making decisions? N  Walking or climbing stairs? N  Dressing or bathing? N  Doing errands, shopping? N  Some recent data might be hidden     Cognitive Testing  Alert? Yes  Normal Appearance?Yes  Oriented to person? Yes  Place? Yes   Time? Yes  Recall of three objects?  Yes  Can perform simple calculations? Yes  Displays appropriate judgment?Yes  Can read the correct time from a watch face?Yes  EOL planning: Does Patient Have a Medical Advance Directive?: No Would patient like information on creating a medical advance directive?: No - Patient declined      Review of Systems:  Review of Systems  Constitutional:  Negative for malaise/fatigue and weight loss.  HENT:  Negative for hearing loss and tinnitus.   Eyes:  Negative for blurred vision and double vision.  Respiratory:  Negative for cough, shortness of breath and wheezing.   Cardiovascular:  Negative for chest pain, palpitations, orthopnea, claudication and leg swelling.  Gastrointestinal:  Negative for abdominal pain, blood in stool, constipation, diarrhea, heartburn, melena, nausea and vomiting.  Genitourinary: Negative.   Musculoskeletal:  Negative for joint pain and myalgias.  Skin:   Negative for rash.  Neurological:  Negative for dizziness, tingling, sensory change, weakness and headaches.  Endo/Heme/Allergies:  Negative for polydipsia.  Psychiatric/Behavioral: Negative.    All other systems reviewed and are negative.  Physical Exam: BP 112/72   Pulse (!) 122   Temp (!) 97.5 F (36.4 C)   Wt 214 lb 12.8 oz (97.4 kg)   SpO2 99%   BMI 34.15 kg/m  Wt Readings from Last 3 Encounters:  06/14/21 214 lb 12.8 oz (97.4 kg)  02/28/21 225 lb 12.8 oz (102.4 kg)  08/08/20 227 lb (103 kg)   General Appearance: Well nourished, in no apparent distress. Eyes: PERRLA, EOMs, conjunctiva no swelling or erythema Sinuses: No Frontal/maxillary tenderness ENT/Mouth: Ext aud canals clear, TMs without erythema, bulging. Mouth and nose not examined- patient wearing a facemask  Hearing normal.  Neck: Supple, thyroid normal.  Respiratory: Respiratory effort normal, BS equal bilaterally without rales, rhonchi, wheezing or stridor.  Cardio: RRR with no MRGs. Brisk peripheral pulses without edema.  Abdomen: Soft distended, + BS. Obese Non tender, no guarding, rebound, hernias, masses. Lymphatics: Non tender without lymphadenopathy.  Musculoskeletal: Full ROM, 5/5 strength, Normal gait Skin: Warm, dry flaky, without rashes, lesions, ecchymosis.  Neuro: Cranial nerves intact. No cerebellar symptoms.  Psych: Awake and oriented X 3, normal affect, Insight and Judgment appropriate.    Medicare Attestation I have personally reviewed: The patient's medical and social history Their use of alcohol, tobacco or illicit drugs Their current medications and supplements The patient's functional ability including ADLs,fall risks, home safety risks, cognitive, and hearing and visual impairment Diet and physical activities Evidence for depression or mood disorders  The patient's weight, height, BMI, and visual acuity have been recorded in the chart.  I have made referrals, counseling, and provided  education to the patient based on review of the above and I have provided the patient with a written personalized care plan for preventive services.      Izora Ribas, NP 9:37 AM [facility]

## 2021-06-14 ENCOUNTER — Ambulatory Visit (INDEPENDENT_AMBULATORY_CARE_PROVIDER_SITE_OTHER): Payer: Medicare HMO | Admitting: Adult Health

## 2021-06-14 ENCOUNTER — Other Ambulatory Visit: Payer: Self-pay

## 2021-06-14 ENCOUNTER — Encounter: Payer: Self-pay | Admitting: Adult Health

## 2021-06-14 VITALS — BP 112/72 | HR 92 | Temp 97.5°F | Wt 214.8 lb

## 2021-06-14 DIAGNOSIS — Z1159 Encounter for screening for other viral diseases: Secondary | ICD-10-CM | POA: Diagnosis not present

## 2021-06-14 DIAGNOSIS — E785 Hyperlipidemia, unspecified: Secondary | ICD-10-CM

## 2021-06-14 DIAGNOSIS — R6889 Other general symptoms and signs: Secondary | ICD-10-CM | POA: Diagnosis not present

## 2021-06-14 DIAGNOSIS — Z0001 Encounter for general adult medical examination with abnormal findings: Secondary | ICD-10-CM

## 2021-06-14 DIAGNOSIS — E559 Vitamin D deficiency, unspecified: Secondary | ICD-10-CM

## 2021-06-14 DIAGNOSIS — E669 Obesity, unspecified: Secondary | ICD-10-CM | POA: Diagnosis not present

## 2021-06-14 DIAGNOSIS — N182 Chronic kidney disease, stage 2 (mild): Secondary | ICD-10-CM

## 2021-06-14 DIAGNOSIS — F325 Major depressive disorder, single episode, in full remission: Secondary | ICD-10-CM

## 2021-06-14 DIAGNOSIS — Z860100 Personal history of colon polyps, unspecified: Secondary | ICD-10-CM | POA: Insufficient documentation

## 2021-06-14 DIAGNOSIS — I10 Essential (primary) hypertension: Secondary | ICD-10-CM | POA: Diagnosis not present

## 2021-06-14 DIAGNOSIS — E1122 Type 2 diabetes mellitus with diabetic chronic kidney disease: Secondary | ICD-10-CM

## 2021-06-14 DIAGNOSIS — R7989 Other specified abnormal findings of blood chemistry: Secondary | ICD-10-CM

## 2021-06-14 DIAGNOSIS — Z79899 Other long term (current) drug therapy: Secondary | ICD-10-CM | POA: Diagnosis not present

## 2021-06-14 DIAGNOSIS — Z0000 Encounter for general adult medical examination without abnormal findings: Secondary | ICD-10-CM

## 2021-06-14 DIAGNOSIS — F900 Attention-deficit hyperactivity disorder, predominantly inattentive type: Secondary | ICD-10-CM

## 2021-06-14 DIAGNOSIS — E1169 Type 2 diabetes mellitus with other specified complication: Secondary | ICD-10-CM

## 2021-06-14 DIAGNOSIS — R69 Illness, unspecified: Secondary | ICD-10-CM | POA: Diagnosis not present

## 2021-06-14 NOTE — Patient Instructions (Addendum)
Please send covid 19 vaccine information   Please schedule overdue diabetes eye exam - please forward the report     Sinus Tachycardia Sinus tachycardia is a kind of fast heartbeat. In sinus tachycardia, the heart beats more than 100 times a minute. Sinus tachycardia starts in a part of the heart called the sinus node. Sinus tachycardia may be harmless, or it may be a sign of a serious condition. What are the causes? This condition may be caused by: Exercise or exertion. A fever. Pain. Loss of body fluids (dehydration). Severe bleeding (hemorrhage). Anxiety and stress. Certain substances, including: Alcohol. Caffeine. Tobacco and nicotine products. Cold medicines. Illegal drugs. Medical conditions including: Heart disease. An infection. An overactive thyroid (hyperthyroidism). A lack of red blood cells (anemia). What are the signs or symptoms? Symptoms of this condition include: A feeling that the heart is beating quickly (palpitations). Suddenly noticing your heartbeat (cardiac awareness). Dizziness. Tiredness (fatigue). Shortness of breath. Chest pain. Nausea. Fainting. How is this diagnosed? This condition is diagnosed with: A physical exam. Other tests, such as: Blood tests. An electrocardiogram (ECG). This test measures the electrical activity of the heart. Ambulatory cardiac monitor. This records your heartbeats for 24 hours or more. You may be referred to a heart specialist (cardiologist). How is this treated? Treatment for this condition depends on the cause or the underlying condition. Treatment may involve: Treating the underlying condition. Taking new medicines or changing your current medicines as told by your health care provider. Making changes to your diet or lifestyle. Follow these instructions at home: Lifestyle  Do not use any products that contain nicotine or tobacco, such as cigarettes and e-cigarettes. If you need help quitting, ask  your health care provider. Do not use illegal drugs, such as cocaine. Learn relaxation methods to help you when you get stressed or anxious. These include deep breathing. Avoid caffeine or other stimulants. Alcohol use  Do not drink alcohol if: Your health care provider tells you not to drink. You are pregnant, may be pregnant, or are planning to become pregnant. If you drink alcohol, limit how much you have: 0-1 drink a day for women. 0-2 drinks a day for men. Be aware of how much alcohol is in your drink. In the [place] one drink equals one typical bottle of beer (12 oz), one-half glass of wine (5 oz), or one shot of hard liquor (1 oz). General instructions Drink enough fluids to keep your urine pale yellow. Take over-the-counter and prescription medicines only as told by your health care provider. Keep all follow-up visits as told by your health care provider. This is important. Contact a health care provider if you have: A fever. Vomiting or diarrhea that does not go away. Get help right away if you: Have pain in your chest, upper arms, jaw, or neck. Become weak or dizzy. Feel faint. Have palpitations that do not go away. Summary In sinus tachycardia, the heart beats more than 100 times a minute. Sinus tachycardia may be harmless, or it may be a sign of a serious condition. Treatment for this condition depends on the cause or the underlying condition. Get help right away if you have pain in your chest, upper arms, jaw, or neck. This information is not intended to replace advice given to you by your health care provider. Make sure you discuss any questions you have with your health care provider. Document Revised: 11/24/2020 Document Reviewed: 11/24/2020 Elsevier Patient Education  2022 [employer].

## 2021-06-15 ENCOUNTER — Other Ambulatory Visit: Payer: Self-pay | Admitting: Adult Health

## 2021-06-15 DIAGNOSIS — R7989 Other specified abnormal findings of blood chemistry: Secondary | ICD-10-CM | POA: Insufficient documentation

## 2021-06-15 LAB — COMPLETE METABOLIC PANEL WITH GFR
AG Ratio: 1.6 (calc) (ref 1.0–2.5)
ALT: 25 U/L (ref 9–46)
AST: 19 U/L (ref 10–35)
Albumin: 4.5 g/dL (ref 3.6–5.1)
Alkaline phosphatase (APISO): 75 U/L (ref 35–144)
BUN: 15 mg/dL (ref 7–25)
CO2: 24 mmol/L (ref 20–32)
Calcium: 9.6 mg/dL (ref 8.6–10.3)
Chloride: 102 mmol/L (ref 98–110)
Creat: 1.19 mg/dL (ref 0.70–1.28)
Globulin: 2.9 g/dL (calc) (ref 1.9–3.7)
Glucose, Bld: 136 mg/dL — ABNORMAL HIGH (ref 65–99)
Potassium: 4.4 mmol/L (ref 3.5–5.3)
Sodium: 137 mmol/L (ref 135–146)
Total Bilirubin: 0.5 mg/dL (ref 0.2–1.2)
Total Protein: 7.4 g/dL (ref 6.1–8.1)
eGFR: 65 mL/min/{1.73_m2} (ref >=60)

## 2021-06-15 LAB — CBC WITH DIFFERENTIAL/PLATELET
Absolute Monocytes: 888 cells/uL (ref 200–950)
Basophils Absolute: 43 cells/uL (ref 0–200)
Basophils Relative: 0.4 %
Eosinophils Absolute: 75 cells/uL (ref 15–500)
Eosinophils Relative: 0.7 %
HCT: 51.5 % — ABNORMAL HIGH (ref 38.5–50.0)
Hemoglobin: 17.2 g/dL — ABNORMAL HIGH (ref 13.2–17.1)
Lymphs Abs: 2119 cells/uL (ref 850–3900)
MCH: 30.7 pg (ref 27.0–33.0)
MCHC: 33.4 g/dL (ref 32.0–36.0)
MCV: 91.8 fL (ref 80.0–100.0)
MPV: 10 fL (ref 7.5–12.5)
Monocytes Relative: 8.3 %
Neutro Abs: 7576 cells/uL (ref 1500–7800)
Neutrophils Relative %: 70.8 %
Platelets: 333 10*3/uL (ref 140–400)
RBC: 5.61 10*6/uL (ref 4.20–5.80)
RDW: 12.5 % (ref 11.0–15.0)
Total Lymphocyte: 19.8 %
WBC: 10.7 10*3/uL (ref 3.8–10.8)

## 2021-06-15 LAB — HEPATITIS C ANTIBODY
Hepatitis C Ab: NONREACTIVE
SIGNAL TO CUT-OFF: 0.06 (ref <1.00)

## 2021-06-15 LAB — LIPID PANEL
Cholesterol: 219 mg/dL — ABNORMAL HIGH (ref <200)
HDL: 61 mg/dL (ref >=40)
LDL Cholesterol (Calc): 136 mg/dL (calc) — ABNORMAL HIGH
Non-HDL Cholesterol (Calc): 158 mg/dL (calc) — ABNORMAL HIGH (ref <130)
Total CHOL/HDL Ratio: 3.6 (calc) (ref <5.0)
Triglycerides: 108 mg/dL (ref <150)

## 2021-06-15 LAB — MAGNESIUM: Magnesium: 2.2 mg/dL (ref 1.5–2.5)

## 2021-06-15 LAB — TSH: TSH: 5.17 mIU/L — ABNORMAL HIGH (ref 0.40–4.50)

## 2021-06-15 LAB — VITAMIN D 25 HYDROXY (VIT D DEFICIENCY, FRACTURES): Vit D, 25-Hydroxy: 67 ng/mL (ref 30–100)

## 2021-06-15 LAB — HEMOGLOBIN A1C
Hgb A1c MFr Bld: 6.2 % of total Hgb — ABNORMAL HIGH (ref <5.7)
Mean Plasma Glucose: 131 mg/dL
eAG (mmol/L): 7.3 mmol/L

## 2021-06-16 NOTE — Progress Notes (Signed)
Patient is aware of lab results and instructions and 6 week NV has been scheduled to re-check his TSH. -Joseph Campbell

## 2021-06-21 ENCOUNTER — Other Ambulatory Visit: Payer: Self-pay | Admitting: Adult Health Nurse Practitioner

## 2021-06-21 DIAGNOSIS — E1122 Type 2 diabetes mellitus with diabetic chronic kidney disease: Secondary | ICD-10-CM

## 2021-06-21 DIAGNOSIS — N182 Chronic kidney disease, stage 2 (mild): Secondary | ICD-10-CM

## 2021-07-26 ENCOUNTER — Ambulatory Visit: Payer: Medicare HMO

## 2021-08-08 ENCOUNTER — Ambulatory Visit (INDEPENDENT_AMBULATORY_CARE_PROVIDER_SITE_OTHER): Payer: Medicare HMO

## 2021-08-08 ENCOUNTER — Other Ambulatory Visit: Payer: Self-pay

## 2021-08-08 ENCOUNTER — Telehealth: Payer: Self-pay

## 2021-08-08 ENCOUNTER — Encounter: Payer: Managed Care, Other (non HMO) | Admitting: Adult Health Nurse Practitioner

## 2021-08-08 DIAGNOSIS — R7989 Other specified abnormal findings of blood chemistry: Secondary | ICD-10-CM

## 2021-08-08 DIAGNOSIS — E039 Hypothyroidism, unspecified: Secondary | ICD-10-CM | POA: Diagnosis not present

## 2021-08-08 LAB — TSH: TSH: 3.05 mIU/L (ref 0.40–4.50)

## 2021-08-08 NOTE — Progress Notes (Signed)
The patient came in for repeat TSH today per provider. The patient voiced no complaints today.

## 2021-08-09 ENCOUNTER — Other Ambulatory Visit: Payer: Self-pay

## 2021-08-09 ENCOUNTER — Other Ambulatory Visit: Payer: Self-pay | Admitting: Adult Health

## 2021-08-09 DIAGNOSIS — F900 Attention-deficit hyperactivity disorder, predominantly inattentive type: Secondary | ICD-10-CM

## 2021-08-09 MED ORDER — METHYLPHENIDATE HCL ER (OSM) 72 MG PO TBCR: EXTENDED_RELEASE_TABLET | ORAL | 0 refills | Status: DC

## 2021-08-09 NOTE — Progress Notes (Signed)
Future Appointments  Date Time Provider [place]  09/19/2021  9:00 AM [name], NP [facility] None  06/14/2022  9:00 AM [name], NP [facility] None    PDMP reviewed for methylphenidate refill request.

## 2021-08-09 NOTE — Telephone Encounter (Signed)
Refill request for Concerta.

## 2021-08-23 ENCOUNTER — Telehealth: Payer: Self-pay

## 2021-08-23 NOTE — Telephone Encounter (Signed)
Patient is requesting a sample of Ozempic, unable to get it at the few pharmacies that he has contacted.   Tried to reach patient, left message to call back. Need to call other pharmacies and we do not have any samples of Ozempic.

## 2021-09-04 ENCOUNTER — Encounter: Payer: Medicare HMO | Admitting: Nurse Practitioner

## 2021-09-15 NOTE — Progress Notes (Signed)
CPE and FOLLOW UP   Assessment and Plan:   Joseph Campbell was seen today for follow-up.  Diagnoses and all orders for this visit:  Encounter for Annual Physical Exam with abnormal findings Due annually  Health Maintenance reviewed Healthy lifestyle reviewed and goals set  Prevnar out in office, get next visit Declines colonoscopy at this time, consider later this year due to work/transporation needs Urged to schedule overdue diabetes eye and dental exams, has access Requested covid 19 vaccine dates  Essential hypertension Continue current medications, emphasized importance of med adherence Monitor blood pressure at home; call if consistently over 140/80 Continue DASH diet.   Reminder to go to the ER if any CP, SOB, nausea, dizziness, severe HA, changes vision/speech, left arm numbness and tingling and jaw pain.  CKD stage 3 due to type 2 diabetes mellitus (HCC) Increase fluids, avoid NSAIDS, monitor sugars, will monitor -     COMPLETE METABOLIC PANEL WITH GFR  Type 2 diabetes mellitus with stage 2 chronic kidney disease, without long-term current use of insulin (HCC) Continue ozempic, restart metformin, STOP GLIPIZIDE, risks discussed, if A1C at goal with restarting metformin plan to stop farxiga due to cost/donut hole concerns Discussed general issues about diabetes pathophysiology and management. Education: Reviewed ABCs of diabetes management (respective goals in parentheses):  A1C (<7), blood pressure (<130/80), and cholesterol (LDL <70) Dietary recommendations Encouraged aerobic exercise.  Discussed foot care, check daily Yearly retinal exam - has number to schedule Dental exam OVERDUE - urged to schedule Monitor blood glucose, discussed goal for patient  Hyperlipidemia associated with type 2 diabetes mellitus ([name]) Continue medications: rosuvastatin 70m daily, compliance encouraged, risks and benefits discussed Discussed dietary and exercise modifications Low fat diet -      Lipid panel  Attention deficit hyperactivity disorder (ADHD), predominantly inattentive type  ADD -  Continue ADD medication, helps with focus, no AE's. The patient was counseled on the addictive nature of the medication and was encouraged to take drug holidays when not needed.   Depression, major, in remission ([name] No medications  Discussed stress management techniques  Discussed, increase water,intake & good sleep hygiene  Discussed increasing exercise & vegetables in diet  Vitamin D deficiency Continue supplementation to maintain goal of 70-100 No supplement Check vitamin D level annually   Obesity - BM 34  Commended progress, continue ozempic Discussed dietary and exercise modifications  Medication management Continue each visit  History of colon polyps Overdue for follow up, GI referral in place but patient declines at this time, work/transportation issues, may be retiring, plan to discuss again later this year  Onychomycosis - declines treatments at this time due to med adherence difficulties and   Scalp flaking Try nizoral shampoo, follow up if not resolving  Orders Placed This Encounter  Procedures   CBC with Differential/Platelet   COMPLETE METABOLIC PANEL WITH GFR   Magnesium   Lipid panel   TSH   Hemoglobin A1c   PSA   Microalbumin / creatinine urine ratio   Urinalysis, Routine w reflex microscopic   EKG 12-Lead   HM DIABETES FOOT EXAM     Continue diet and meds as discussed. Further disposition pending results of labs. Discussed med's effects and SE's.    Further disposition pending results if labs check today. Discussed med's effects and SE's.   Over 30 minutes of face to face interview, exam, counseling, chart review, and critical decision making was performed.    Future Appointments  Date Time Provider [place] 12/19/2021  9:30 AM Unk Pinto, MD [facility] None  06/14/2022  9:00 AM [name], NP [facility] None   09/19/2022  9:00 AM [name], NP [facility] None     Plan:   During the course of the visit the patient was educated and counseled about appropriate screening and preventive services including:   Pneumococcal vaccine  Prevnar 13 Influenza vaccine Td vaccine Screening electrocardiogram Bone densitometry screening Colorectal cancer screening Diabetes screening Glaucoma screening Nutrition counseling  Advanced directives: requested   ----------------------------------------------------------------------------------------------------------------------  HPI 71 y.o. male  presents for CPE and 3 month follow up. 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  He is divorced, 2 children, 3 grandchildren. 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   Patient is on an ADD medication (methylphenidate 72 mg daily), he states that the medication is helping and he denies any adverse reactions.  He estimates taking 5 day/week.   BMI is Body mass index is 34.37 kg/m., he has been working on diet, admits not enough exercise, dose elliptical 10 min twice weekly. Weight down since on ozempic. His goal is 160s.  Wt Readings from Last 3 Encounters:  09/19/21 216 lb 3.2 oz (98.1 kg)  08/08/21 214 lb 3.2 oz (97.2 kg)  06/14/21 214 lb 12.8 oz (97.4 kg)   His blood pressure has been controlled at home (reports when taking olmesartan 120s/70s but forgets to take med, hasn't taken in 3 days), today their BP is BP: (!) 156/74  He does workout. He denies chest pain, shortness of breath, dizziness.     He is on cholesterol medication (crestor 40 mg daily but admits forgets, estimates taking 75% of the time) and denies myalgias. His cholesterol is not at goal of less than 70. The cholesterol last visit was:   Lab Results  Component Value Date   CHOL 219 (H) 06/14/2021   HDL 61 06/14/2021   LDLCALC 136 (H) 06/14/2021   TRIG 108 06/14/2021   CHOLHDL 3.6 06/14/2021    He has been working on diet and exercise for DMII- has been increasing steadily since 08/2018 With CKD on ARB With hyperlipidemia not at goal of less than 70, on crestor 70m on metformin - not currently taking, causes lose stools, however states can tolerate in the evening once home from work, will restart Farxiga 10 mg daily  Ozempic taking 1 mg/week He is checking sugars but not daily  Fasting range is 88-140s. denies foot ulcerations, increased appetite, nausea, paresthesia of the feet, polydipsia, polyuria, visual disturbances, vomiting and weight loss.   Last A1C in the office improved from 12.7 to:  Lab Results  Component Value Date   HGBA1C 6.2 (H) 06/14/2021   He has CKD II, on olmesartan:  Lab Results  Component Value Date   GFRNONAA 61 08/08/2020   Patient is on Vitamin D supplement, on 50,000 IU but states hasn't taken in quite some time. Taking 8000 IU every other day.  Lab Results  Component Value Date   VD25OH 67 06/14/2021     Denies LUTs, 1 episode of nocturia. Last PSA was: Lab Results  Component Value Date   PSA 2.91 08/08/2020    Lab Results  Component Value Date   VITAMINB12 495 01/14/2018     Current Medications:   Current Outpatient Medications (Endocrine & Metabolic):    FARXIGA 10 MG TABS tablet, TAKE 1 TABLET BY MOUTH DAILY FOR DIABETES   metFORMIN (GLUCOPHAGE-XR) 500 MG 24 hr tablet, Take 4 tabs a day with meals for diabetes.   Semaglutide, 1 MG/DOSE, (OZEMPIC, 1 MG/DOSE,) 4 MG/3ML SOPN, Inject 1 mg into the skin once a week.  Current Outpatient Medications  (Cardiovascular):    olmesartan (BENICAR) 40 MG tablet, Take  1 tablet  Daily  for BP & Diabetic Kidney Protection / Patient knows to take by mouth   rosuvastatin (CRESTOR) 40 MG tablet, TAKE 1 TABLET BY MOUTH DAILY FOR CHOLESTEROL   Current Outpatient Medications (Analgesics):    aspirin 81 MG chewable tablet, Chew by mouth daily.   Current Outpatient Medications (Other):    blood glucose meter kit and supplies KIT, Dispense based on patient's  insurance preference, [employer] Elite-E11.22   Cholecalciferol 50 MCG (2000 UT) CAPS, Take 8,000 Units by mouth every other day.   FREESTYLE LITE test strip, CHECK GLUCOSE THREE TIMES DAILY   ketoconazole (NIZORAL) 2 % cream, Apply 1 application topically 2 (two) times daily.   Lancets (FREESTYLE) lancets, TEST BLOOD SUGAR THREE TIMES DAILY   Methylphenidate HCl ER, OSM, 72 MG TBCR, TAKE 1 TABLET BY MOUTH DAILY IF NEEDED FOR ALERTNESS. DO NOT TAKE DAILY. IDEALLY<5 DAYS PER WEEK   Allergies:  Allergies  Allergen Reactions   Adderall [Amphetamine-Dextroamphet Er]     MOOD CHANGE   Lipitor [Atorvastatin]     MYALGIA   Other     FLU VACCINE   Wellbutrin [Bupropion]     HA   Zoloft [Sertraline Hcl]     AGGITATION     Medical History:  Past Medical History:  Diagnosis Date   ADD (attention deficit disorder)    Anxiety    Depression    Diabetes mellitus without complication ([place])    Hyperlipidemia    Hypertension    Unspecified vitamin D deficiency    Allergies:  Allergies  Allergen Reactions   Adderall [Amphetamine-Dextroamphet Er]     MOOD CHANGE   Lipitor [Atorvastatin]     MYALGIA   Other     FLU VACCINE   Wellbutrin [Bupropion]     HA   Zoloft [Sertraline Hcl]     AGGITATION   Surgical History:  He  has a past surgical history that includes Colonoscopy and Polypectomy. Family History:  Hisfamily history includes Breast cancer in his mother; Hyperlipidemia in his brother and sister; Hypertension in his brother, father,  mother, and sister. Social History:   reports that he quit smoking about 16 years ago. His smoking use included cigars. His smokeless tobacco use includes chew. He reports current alcohol use of about 7.0 standard drinks per week. He reports that he does not use drugs.   Screening Tests: Immunization History  Administered Date(s) Administered   DT (Pediatric) 09/16/2014   PPD Test 09/16/2014, 10/28/2015   Pneumococcal-Unspecified 07/31/1999   Td 07/31/2003   Zoster, Live 05/26/2013   Health Maintenance  Topic Date Due   COVID-19 Vaccine (1) Never done   OPHTHALMOLOGY EXAM  06/13/2013   INFLUENZA VACCINE  10/27/2021 (Originally 02/27/2021)   Pneumonia Vaccine 62+ Years old (1 - PCV) 11/17/2021 (Originally 09/26/2013)   COLONOSCOPY (Pts 45-66yr Insurance coverage will need to be confirmed)  12/17/2021 (Originally 06/17/2020)   HEMOGLOBIN A1C  12/12/2021   FOOT EXAM  09/19/2022   TETANUS/TDAP  09/16/2024   Hepatitis C Screening  Completed   HPV VACCINES  Aged Out   Zoster Vaccines- Shingrix  Discontinued    Last colonoscopy: 05/2019, 7 polyps, Dr. Henrene Pastor, referral in and pending, states will call when able, can't currently have done due to work and transportation needs   Influenza: Declines Pneumococcal: would consider 20 valent, out in office, discussed and AWV Zoster 2014 Shingles/Zostavax: Discussed Covid 19 -2/2 2021, report requested  Names of Other Physician/Practitioners you currently use: 1. [facility] here for primary care 2. Eye Exam, OVERDUE, has number to schedule from insurance  3.Dental exam: Dr. Gloriann Loan, OVERDUE, needs teeth pulled and dentures  Patient Care Team: Unk Pinto, MD as PCP - General (Internal Medicine)     Review of Systems:  Review of Systems  Constitutional:  Negative for malaise/fatigue and weight loss.  HENT:  Negative for hearing loss and tinnitus.   Eyes:  Negative for blurred vision and double  vision.  Respiratory:  Negative for cough, shortness of breath and wheezing.   Cardiovascular:  Negative for chest pain, palpitations, orthopnea, claudication and leg swelling.  Gastrointestinal:  Negative for abdominal pain, blood in stool, constipation, diarrhea, heartburn, melena, nausea and vomiting.  Genitourinary: Negative.   Musculoskeletal:  Negative for joint pain and myalgias.  Skin:  Negative for rash.  Neurological:  Negative for dizziness, tingling, sensory change, weakness and headaches.  Endo/Heme/Allergies:  Negative for polydipsia.  Psychiatric/Behavioral: Negative.    All other systems reviewed and are negative.  Physical Exam: BP (!) 156/74    Pulse 96    Temp 97.7 F (36.5 C)    Ht 5' 6.5" (1.689 m)    Wt 216 lb 3.2 oz (98.1 kg)    SpO2 99%    BMI 34.37 kg/m  Wt Readings from Last 3 Encounters:  09/19/21 216 lb 3.2 oz (98.1 kg)  08/08/21 214 lb 3.2 oz (97.2 kg)  06/14/21 214 lb 12.8 oz (97.4 kg)   General Appearance: Well nourished, in no apparent distress. Eyes: PERRLA, EOMs, conjunctiva no swelling or erythema Sinuses: No Frontal/maxillary tenderness ENT/Mouth: Ext aud canals clear, TMs without erythema, bulging. Nares patent. MMM, poor dentition, no lesions. Post pharynx not inflamed, without discharge, tonsils not enlarged or erythematous. Hearing normal.  Neck: Supple, thyroid normal.  Respiratory: Respiratory effort normal, BS equal bilaterally without rales, rhonchi, wheezing or stridor.  Cardio: RRR with no MRGs. Brisk peripheral pulses without edema.  Abdomen: Soft distended, + BS. Obese Non tender, no guarding, rebound, masses. Mild ventral henia with bearing down.  Lymphatics: Non tender without lymphadenopathy.  Musculoskeletal: Full ROM, 5/5 strength, Normal gait Skin: Warm, dry flaky, without rashes, lesions, ecchymosis.  Neuro: Cranial nerves intact. No cerebellar symptoms. Sensation intact to monofilament.  Psych: Awake and oriented X 3, normal  affect, Insight and Judgment appropriate.  Skin: warm, dry, intact. Scattered benign appearing nevi, extensive seborrheic keratoses to back, non-inflamed. Base of posterior scalp mildly flaking. Bil toes with thickened yellowed nails.   EKG: NSR   Izora Ribas, NP 5:18 PM [facility]

## 2021-09-19 ENCOUNTER — Other Ambulatory Visit (HOSPITAL_COMMUNITY): Payer: Self-pay

## 2021-09-19 ENCOUNTER — Encounter: Payer: Self-pay | Admitting: Adult Health

## 2021-09-19 ENCOUNTER — Other Ambulatory Visit: Payer: Self-pay

## 2021-09-19 ENCOUNTER — Ambulatory Visit (INDEPENDENT_AMBULATORY_CARE_PROVIDER_SITE_OTHER): Payer: Medicare HMO | Admitting: Adult Health

## 2021-09-19 VITALS — BP 156/74 | HR 96 | Temp 97.7°F | Ht 66.5 in | Wt 216.2 lb

## 2021-09-19 DIAGNOSIS — B351 Tinea unguium: Secondary | ICD-10-CM

## 2021-09-19 DIAGNOSIS — I10 Essential (primary) hypertension: Secondary | ICD-10-CM | POA: Diagnosis not present

## 2021-09-19 DIAGNOSIS — Z86010 Personal history of colonic polyps: Secondary | ICD-10-CM

## 2021-09-19 DIAGNOSIS — Z79899 Other long term (current) drug therapy: Secondary | ICD-10-CM

## 2021-09-19 DIAGNOSIS — E1169 Type 2 diabetes mellitus with other specified complication: Secondary | ICD-10-CM | POA: Diagnosis not present

## 2021-09-19 DIAGNOSIS — E785 Hyperlipidemia, unspecified: Secondary | ICD-10-CM | POA: Diagnosis not present

## 2021-09-19 DIAGNOSIS — E669 Obesity, unspecified: Secondary | ICD-10-CM

## 2021-09-19 DIAGNOSIS — Z0001 Encounter for general adult medical examination with abnormal findings: Secondary | ICD-10-CM

## 2021-09-19 DIAGNOSIS — E1122 Type 2 diabetes mellitus with diabetic chronic kidney disease: Secondary | ICD-10-CM

## 2021-09-19 DIAGNOSIS — F900 Attention-deficit hyperactivity disorder, predominantly inattentive type: Secondary | ICD-10-CM

## 2021-09-19 DIAGNOSIS — Z9189 Other specified personal risk factors, not elsewhere classified: Secondary | ICD-10-CM

## 2021-09-19 DIAGNOSIS — Z136 Encounter for screening for cardiovascular disorders: Secondary | ICD-10-CM

## 2021-09-19 DIAGNOSIS — Z125 Encounter for screening for malignant neoplasm of prostate: Secondary | ICD-10-CM | POA: Diagnosis not present

## 2021-09-19 DIAGNOSIS — Z0000 Encounter for general adult medical examination without abnormal findings: Secondary | ICD-10-CM | POA: Diagnosis not present

## 2021-09-19 DIAGNOSIS — Z1329 Encounter for screening for other suspected endocrine disorder: Secondary | ICD-10-CM

## 2021-09-19 DIAGNOSIS — F325 Major depressive disorder, single episode, in full remission: Secondary | ICD-10-CM

## 2021-09-19 DIAGNOSIS — E559 Vitamin D deficiency, unspecified: Secondary | ICD-10-CM

## 2021-09-19 DIAGNOSIS — N182 Chronic kidney disease, stage 2 (mild): Secondary | ICD-10-CM

## 2021-09-19 MED ORDER — METFORMIN HCL ER 500 MG PO TB24: ORAL_TABLET | ORAL | 3 refills | Status: DC

## 2021-09-19 MED ORDER — METHYLPHENIDATE HCL ER (OSM) 72 MG PO TBCR: EXTENDED_RELEASE_TABLET | ORAL | 0 refills | Status: DC

## 2021-09-19 MED ORDER — OZEMPIC (1 MG/DOSE) 4 MG/3ML ~~LOC~~ SOPN
1.0000 mg | PEN_INJECTOR | SUBCUTANEOUS | 0 refills | Status: DC
  Filled 2021-09-19: qty 9, 84d supply, fill #0

## 2021-09-19 NOTE — Patient Instructions (Addendum)
°  Joseph Campbell , Thank you for taking time to come for your Annual Wellness Visit. I appreciate your ongoing commitment to your health goals. Please review the following plan we discussed and let me know if I can assist you in the future.   These are the goals we discussed:  Goals      Blood Pressure < 140/80     HEMOGLOBIN A1C < 7.0     LDL CALC < 70     Weight (lb) < 200 lb (90.7 kg)        This is a list of the screening recommended for you and due dates:  Health Maintenance  Topic Date Due   COVID-19 Vaccine (1) Never done   Eye exam for diabetics  06/13/2013   Flu Shot  10/27/2021*   Pneumonia Vaccine (1 - PCV) 11/17/2021*   Colon Cancer Screening  12/17/2021*   Hemoglobin A1C  12/12/2021   Complete foot exam   09/19/2022   Tetanus Vaccine  09/16/2024   Hepatitis C Screening: USPSTF Recommendation to screen - Ages 18-79 yo.  Completed   HPV Vaccine  Aged Out   Zoster (Shingles) Vaccine  Discontinued  *Topic was postponed. The date shown is not the original due date.     Please make sure you are taking your meds - particulary blood pressure meds like olmesartan DAILY    Please restart metformin in the evening with your meal - start with 1 tab/day and gradually increase as tolerated to 4 tabs/day  Continue ozempic  STOP glipizide due to risk of low sugars   Please schedule diabetes eye exam ASAP and forward report  Please send covid 19 vaccine dates  Please call if/when you are ready for colonoscopy        Know what a healthy weight is for you (roughly BMI <25) and aim to maintain this  Aim for 5+ servings of fruits and vegetables daily  65-80+ fluid ounces of water or unsweet tea for healthy kidneys  Limit to max 1 drink of alcohol per day; avoid smoking/tobacco  Limit animal fats in diet for cholesterol and heart health - choose grass fed whenever available  Avoid highly processed foods, and foods high in saturated/trans fats  Aim for low stress -  take time to unwind and care for your mental health  Aim for 150 min of moderate intensity exercise weekly for heart health, and weights twice weekly for bone health  Aim for 7-9 hours of sleep daily    A great goal to work towards is aiming to get in a serving daily of some of the most nutritionally dense foods - G- BOMBS daily

## 2021-09-20 LAB — LIPID PANEL
Cholesterol: 262 mg/dL — ABNORMAL HIGH (ref <200)
HDL: 53 mg/dL (ref >=40)
Non-HDL Cholesterol (Calc): 209 mg/dL (calc) — ABNORMAL HIGH (ref <130)
Total CHOL/HDL Ratio: 4.9 (calc) (ref <5.0)
Triglycerides: 519 mg/dL — ABNORMAL HIGH (ref <150)

## 2021-09-20 LAB — CBC WITH DIFFERENTIAL/PLATELET
Absolute Monocytes: 855 cells/uL (ref 200–950)
Basophils Absolute: 38 cells/uL (ref 0–200)
Basophils Relative: 0.4 %
Eosinophils Absolute: 162 cells/uL (ref 15–500)
Eosinophils Relative: 1.7 %
HCT: 48.2 % (ref 38.5–50.0)
Hemoglobin: 16.3 g/dL (ref 13.2–17.1)
Lymphs Abs: 2071 cells/uL (ref 850–3900)
MCH: 30.9 pg (ref 27.0–33.0)
MCHC: 33.8 g/dL (ref 32.0–36.0)
MCV: 91.3 fL (ref 80.0–100.0)
MPV: 10.4 fL (ref 7.5–12.5)
Monocytes Relative: 9 %
Neutro Abs: 6375 cells/uL (ref 1500–7800)
Neutrophils Relative %: 67.1 %
Platelets: 335 10*3/uL (ref 140–400)
RBC: 5.28 10*6/uL (ref 4.20–5.80)
RDW: 11.9 % (ref 11.0–15.0)
Total Lymphocyte: 21.8 %
WBC: 9.5 10*3/uL (ref 3.8–10.8)

## 2021-09-20 LAB — URINALYSIS, ROUTINE W REFLEX MICROSCOPIC
Bilirubin Urine: NEGATIVE
Glucose, UA: NEGATIVE
Hgb urine dipstick: NEGATIVE
Ketones, ur: NEGATIVE
Leukocytes,Ua: NEGATIVE
Nitrite: NEGATIVE
Protein, ur: NEGATIVE
Specific Gravity, Urine: 1.011 (ref 1.001–1.035)
pH: 5.5 (ref 5.0–8.0)

## 2021-09-20 LAB — COMPLETE METABOLIC PANEL WITH GFR
AG Ratio: 1.5 (calc) (ref 1.0–2.5)
ALT: 17 U/L (ref 9–46)
AST: 14 U/L (ref 10–35)
Albumin: 4.5 g/dL (ref 3.6–5.1)
Alkaline phosphatase (APISO): 74 U/L (ref 35–144)
BUN: 12 mg/dL (ref 7–25)
CO2: 30 mmol/L (ref 20–32)
Calcium: 9.9 mg/dL (ref 8.6–10.3)
Chloride: 98 mmol/L (ref 98–110)
Creat: 1.19 mg/dL (ref 0.70–1.28)
Globulin: 3 g/dL (calc) (ref 1.9–3.7)
Glucose, Bld: 146 mg/dL — ABNORMAL HIGH (ref 65–99)
Potassium: 4.6 mmol/L (ref 3.5–5.3)
Sodium: 136 mmol/L (ref 135–146)
Total Bilirubin: 0.4 mg/dL (ref 0.2–1.2)
Total Protein: 7.5 g/dL (ref 6.1–8.1)
eGFR: 65 mL/min/{1.73_m2} (ref >=60)

## 2021-09-20 LAB — HEMOGLOBIN A1C
Hgb A1c MFr Bld: 6.8 % of total Hgb — ABNORMAL HIGH (ref <5.7)
Mean Plasma Glucose: 148 mg/dL
eAG (mmol/L): 8.2 mmol/L

## 2021-09-20 LAB — MICROALBUMIN / CREATININE URINE RATIO
Creatinine, Urine: 91 mg/dL (ref 20–320)
Microalb Creat Ratio: 38 mcg/mg creat — ABNORMAL HIGH (ref <30)
Microalb, Ur: 3.5 mg/dL

## 2021-09-20 LAB — TSH: TSH: 3.62 mIU/L (ref 0.40–4.50)

## 2021-09-20 LAB — MAGNESIUM: Magnesium: 2.2 mg/dL (ref 1.5–2.5)

## 2021-09-20 LAB — PSA: PSA: 2.9 ng/mL (ref <=4.00)

## 2021-10-01 ENCOUNTER — Other Ambulatory Visit: Payer: Self-pay | Admitting: Internal Medicine

## 2021-10-01 MED ORDER — OLMESARTAN MEDOXOMIL 40 MG PO TABS: ORAL_TABLET | ORAL | 3 refills | Status: DC

## 2021-11-06 ENCOUNTER — Other Ambulatory Visit: Payer: Self-pay | Admitting: Internal Medicine

## 2021-11-06 DIAGNOSIS — N182 Chronic kidney disease, stage 2 (mild): Secondary | ICD-10-CM

## 2021-11-06 MED ORDER — METFORMIN HCL ER 500 MG PO TB24: ORAL_TABLET | ORAL | 3 refills | Status: DC

## 2021-12-18 ENCOUNTER — Encounter: Payer: Self-pay | Admitting: Internal Medicine

## 2021-12-18 NOTE — Progress Notes (Unsigned)
Future Appointments  Date Time Provider Department  12/19/2021  9:30 AM Unk Pinto, MD [facility]  06/14/2022         Wellness   9:00 AM [name], NP [facility]  09/19/2022           CPE   9:00 AM [name], NP [facility]    History of Present Illness:       This very nice 71 y.o. [place] presents for 6 month follow up with HTN, HLD, T2_NIDDM  and Vitamin D Deficiency. Patient has inattentive ADD and uses Concerta with improved focus & concentration.       Patient is treated for HTN  since the 1990's  & BP has been controlled at home. Today's  . Patient has had no complaints of any cardiac type chest pain, palpitations, dyspnea / orthopnea / PND, dizziness, claudication, or dependent edema.       Hyperlipidemia is controlled with diet & meds. Patient denies myalgias or other med SE's. Last Lipids were not at goal :  Lab Results  Component Value Date   CHOL 262 (H) 09/19/2021   HDL 53 09/19/2021   LDLCALC  not calculated 09/19/2021   TRIG 519 (H) 09/19/2021   CHOLHDL 4.9 09/19/2021     Also, the patient has history of T2_NIDDM  (2010) w/CKD 2 (GFR 65) on Metformin, Farxiga & Ozempic  and has had no symptoms of reactive hypoglycemia, diabetic polys, paresthesias or visual blurring.  Last A1c was not at goal :  Lab Results  Component Value Date   HGBA1C 6.8 (H) 09/19/2021                                                          Further, the patient also has history of Vitamin D Deficiency  ("26" /2008) and supplements vitamin D . Last vitamin D was at goal :  Lab Results  Component Value Date   VD25OH 67 06/14/2021     Current Outpatient Medications on File Prior to Visit  Medication Sig   aspirin 81 MG chewable tablet Chew by mouth daily.   Cholecalciferol 50 MCG (2000 UT) CAPS Take 8,000 Units  every other day.   FARXIGA 10 MG TABS tablet TAKE 1 TABLET  DAILY FOR DIABETES   ketoconazole (NIZORAL) 2 % cream Apply 1 application topically 2 (two) times  daily.   metFORMIN-XR) 500 MG  Take  2 tablets  2 x /day  with Meals for Diabetes.   Methylphenidate  ER, OSM, 72 MG TBCR TAKE 1 TABLET  DAILY IF NEEDED FOR ALERTNESS.    olmesartan (BENICAR) 40 MG tablet Take  1 tablet  Daily  for BP & Diabetic Kidney Protection    rosuvastatin 40 MG tablet TAKE 1 TABLET DAILY FOR CHOLESTEROL   Semaglutide, 1 MG/DOSE, (OZEMPIC, 1 MG/DOSE,) 4 MG/3ML SOPN Inject 1 mg into the skin once a week.      Allergies  Allergen Reactions   Adderall [Amphetamine-Dextroamphet Er]     MOOD CHANGE   Lipitor [Atorvastatin]     MYALGIA   Other     FLU VACCINE   Wellbutrin [Bupropion]     HA   Zoloft [Sertraline Hcl]     AGGITATION     PMHx:   Past Medical History:  Diagnosis Date  ADD (attention deficit disorder)    Anxiety    Depression    Diabetes mellitus without complication ([place])    Hyperlipidemia    Hypertension    Unspecified vitamin D deficiency      Immunization History  Administered Date(s) Administered   DT (Pediatric) 09/16/2014   PPD Test 09/16/2014, 10/28/2015   Pneumococcal - 23 07/31/1999   Td 07/31/2003   Zoster, Live 05/26/2013     Past Surgical History:  Procedure Laterality Date   COLONOSCOPY     POLYPECTOMY      FHx:    Reviewed / unchanged  SHx:    Reviewed / unchanged   Systems Review:  Constitutional: Denies fever, chills, wt changes, headaches, insomnia, fatigue, night sweats, change in appetite. Eyes: Denies redness, blurred vision, diplopia, discharge, itchy, watery eyes.  ENT: Denies discharge, congestion, post nasal drip, epistaxis, sore throat, earache, hearing loss, dental pain, tinnitus, vertigo, sinus pain, snoring.  CV: Denies chest pain, palpitations, irregular heartbeat, syncope, dyspnea, diaphoresis, orthopnea, PND, claudication or edema. Respiratory: denies cough, dyspnea, DOE, pleurisy, hoarseness, laryngitis, wheezing.  Gastrointestinal: Denies dysphagia, odynophagia, heartburn, reflux, water  brash, abdominal pain or cramps, nausea, vomiting, bloating, diarrhea, constipation, hematemesis, melena, hematochezia  or hemorrhoids. Genitourinary: Denies dysuria, frequency, urgency, nocturia, hesitancy, discharge, hematuria or flank pain. Musculoskeletal: Denies arthralgias, myalgias, stiffness, jt. swelling, pain, limping or strain/sprain.  Skin: Denies pruritus, rash, hives, warts, acne, eczema or change in skin lesion(s). Neuro: No weakness, tremor, incoordination, spasms, paresthesia or pain. Psychiatric: Denies confusion, memory loss or sensory loss. Endo: Denies change in weight, skin or hair change.  Heme/Lymph: No excessive bleeding, bruising or enlarged lymph nodes.  Physical Exam  There were no vitals taken for this visit.  Appears  well nourished, well groomed  and in no distress.  Eyes: PERRLA, EOMs, conjunctiva no swelling or erythema. Sinuses: No frontal/maxillary tenderness ENT/Mouth: EAC's clear, TM's nl w/o erythema, bulging. Nares clear w/o erythema, swelling, exudates. Oropharynx clear without erythema or exudates. Oral hygiene is good. Tongue normal, non obstructing. Hearing intact.  Neck: Supple. Thyroid not palpable. Car 2+/2+ without bruits, nodes or JVD. Chest: Respirations nl with BS clear & equal w/o rales, rhonchi, wheezing or stridor.  Cor: Heart sounds normal w/ regular rate and rhythm without sig. murmurs, gallops, clicks or rubs. Peripheral pulses normal and equal  without edema.  Abdomen: Soft & bowel sounds normal. Non-tender w/o guarding, rebound, hernias, masses or organomegaly.  Lymphatics: Unremarkable.  Musculoskeletal: Full ROM all peripheral extremities, joint stability, 5/5 strength and normal gait.  Skin: Warm, dry without exposed rashes, lesions or ecchymosis apparent.  Neuro: Cranial nerves intact, reflexes equal bilaterally. Sensory-motor testing grossly intact. Tendon reflexes grossly intact.  Pysch: Alert & oriented x 3.  Insight and  judgement nl & appropriate. No ideations.  Assessment and Plan:  - Continue medication, monitor blood pressure at home.  - Continue DASH diet.  Reminder to go to the ER if any CP,  SOB, nausea, dizziness, severe HA, changes vision/speech.  - Continue diet/meds, exercise,& lifestyle modifications.  - Continue monitor periodic cholesterol/liver & renal functions    - Continue diet, exercise  - Lifestyle modifications.  - Monitor appropriate labs. - Continue supplementation.        Discussed  regular exercise, BP monitoring, weight control to achieve/maintain BMI less than 25 and discussed med and SE's. Recommended labs to assess /monitor clinical status .  I discussed the assessment and treatment plan with the patient. The patient was provided an  opportunity to ask questions and all were answered. The patient agreed with the plan and demonstrated an understanding of the instructions.  I provided over 30 minutes of exam, counseling, chart review and  complex critical decision making.        The patient was advised to call back or seek an in-person evaluation if the symptoms worsen or if the condition fails to improve as anticipated.   Kirtland Bouchard, MD

## 2021-12-18 NOTE — Patient Instructions (Signed)

## 2021-12-19 ENCOUNTER — Ambulatory Visit (INDEPENDENT_AMBULATORY_CARE_PROVIDER_SITE_OTHER): Payer: Medicare HMO | Admitting: Internal Medicine

## 2021-12-19 ENCOUNTER — Encounter: Payer: Self-pay | Admitting: Internal Medicine

## 2021-12-19 VITALS — BP 128/74 | HR 80 | Temp 97.9°F | Resp 16 | Ht 66.5 in | Wt 207.8 lb

## 2021-12-19 DIAGNOSIS — N182 Chronic kidney disease, stage 2 (mild): Secondary | ICD-10-CM

## 2021-12-19 DIAGNOSIS — Z79899 Other long term (current) drug therapy: Secondary | ICD-10-CM

## 2021-12-19 DIAGNOSIS — E559 Vitamin D deficiency, unspecified: Secondary | ICD-10-CM | POA: Diagnosis not present

## 2021-12-19 DIAGNOSIS — I10 Essential (primary) hypertension: Secondary | ICD-10-CM

## 2021-12-19 DIAGNOSIS — E1122 Type 2 diabetes mellitus with diabetic chronic kidney disease: Secondary | ICD-10-CM | POA: Diagnosis not present

## 2021-12-19 DIAGNOSIS — E785 Hyperlipidemia, unspecified: Secondary | ICD-10-CM

## 2021-12-19 DIAGNOSIS — E1169 Type 2 diabetes mellitus with other specified complication: Secondary | ICD-10-CM

## 2021-12-20 LAB — CBC WITH DIFFERENTIAL/PLATELET
Absolute Monocytes: 753 cells/uL (ref 200–950)
Basophils Absolute: 56 cells/uL (ref 0–200)
Basophils Relative: 0.6 %
Eosinophils Absolute: 149 cells/uL (ref 15–500)
Eosinophils Relative: 1.6 %
HCT: 44.5 % (ref 38.5–50.0)
Hemoglobin: 15.1 g/dL (ref 13.2–17.1)
Lymphs Abs: 2409 cells/uL (ref 850–3900)
MCH: 30.8 pg (ref 27.0–33.0)
MCHC: 33.9 g/dL (ref 32.0–36.0)
MCV: 90.6 fL (ref 80.0–100.0)
MPV: 9.9 fL (ref 7.5–12.5)
Monocytes Relative: 8.1 %
Neutro Abs: 5933 cells/uL (ref 1500–7800)
Neutrophils Relative %: 63.8 %
Platelets: 395 10*3/uL (ref 140–400)
RBC: 4.91 10*6/uL (ref 4.20–5.80)
RDW: 12.6 % (ref 11.0–15.0)
Total Lymphocyte: 25.9 %
WBC: 9.3 10*3/uL (ref 3.8–10.8)

## 2021-12-20 LAB — MAGNESIUM: Magnesium: 1.9 mg/dL (ref 1.5–2.5)

## 2021-12-20 LAB — LIPID PANEL
Cholesterol: 216 mg/dL — ABNORMAL HIGH (ref <200)
HDL: 65 mg/dL (ref >=40)
LDL Cholesterol (Calc): 111 mg/dL (calc) — ABNORMAL HIGH
Non-HDL Cholesterol (Calc): 151 mg/dL (calc) — ABNORMAL HIGH (ref <130)
Total CHOL/HDL Ratio: 3.3 (calc) (ref <5.0)
Triglycerides: 301 mg/dL — ABNORMAL HIGH (ref <150)

## 2021-12-20 LAB — HEMOGLOBIN A1C
Hgb A1c MFr Bld: 7.6 % of total Hgb — ABNORMAL HIGH (ref <5.7)
Mean Plasma Glucose: 171 mg/dL
eAG (mmol/L): 9.5 mmol/L

## 2021-12-20 LAB — COMPLETE METABOLIC PANEL WITH GFR
AG Ratio: 1.5 (calc) (ref 1.0–2.5)
ALT: 19 U/L (ref 9–46)
AST: 15 U/L (ref 10–35)
Albumin: 4.6 g/dL (ref 3.6–5.1)
Alkaline phosphatase (APISO): 68 U/L (ref 35–144)
BUN/Creatinine Ratio: 11 (calc) (ref 6–22)
BUN: 15 mg/dL (ref 7–25)
CO2: 25 mmol/L (ref 20–32)
Calcium: 10.4 mg/dL — ABNORMAL HIGH (ref 8.6–10.3)
Chloride: 98 mmol/L (ref 98–110)
Creat: 1.31 mg/dL — ABNORMAL HIGH (ref 0.70–1.28)
Globulin: 3 g/dL (calc) (ref 1.9–3.7)
Glucose, Bld: 233 mg/dL — ABNORMAL HIGH (ref 65–99)
Potassium: 4.5 mmol/L (ref 3.5–5.3)
Sodium: 136 mmol/L (ref 135–146)
Total Bilirubin: 0.4 mg/dL (ref 0.2–1.2)
Total Protein: 7.6 g/dL (ref 6.1–8.1)
eGFR: 57 mL/min/{1.73_m2} — ABNORMAL LOW (ref >=60)

## 2021-12-20 LAB — INSULIN, RANDOM: Insulin: 29.1 u[IU]/mL — ABNORMAL HIGH

## 2021-12-20 LAB — VITAMIN D 25 HYDROXY (VIT D DEFICIENCY, FRACTURES): Vit D, 25-Hydroxy: 54 ng/mL (ref 30–100)

## 2021-12-20 LAB — TSH: TSH: 4.42 mIU/L (ref 0.40–4.50)

## 2021-12-20 NOTE — Progress Notes (Signed)
<><><><><><><><><><><><><><><><><><><><><><><><><><><><><><><><><> <><><><><><><><><><><><><><><><><><><><><><><><><><><><><><><><><>  - Glucose = 23 mg%  - too high        (  Ideal or Goal is less than 130 mg% )  &  - A1c - Much Worse - up from 6.8% to now 7.6%   - You absolutely must get on a better diet & lose weight  !   Being diabetic has a  300% increased risk for heart attack,                             stroke, cancer, and alzheimer- type vascular dementia.   It is very important that you work harder with diet by                    avoiding all foods that are white except chicken, fish & calliflower.  - Avoid white rice  (brown & wild rice is OK),   - Avoid white potatoes  (sweet potatoes in moderation is OK),   White bread or wheat bread or anything made out of   white flour like bagels, donuts, rolls, buns, biscuits, cakes,  - pastries, cookies, pizza crust, and pasta (made from  white flour & egg whites)   - vegetarian pasta or spinach or wheat pasta is OK.  - Multigrain breads like Arnold's, Pepperidge Farm or   multigrain sandwich thins or high fiber breads like   E[place]ka bread or "Dave's Killer" breads that are  4 to 5 grams fiber per slice !  are best.    <><><><><><><><><><><><><><><><><><><><><><><><><><><><><><><><><>                    - Diet, exercise and weight loss is very important in the   control and prevention of complications of diabetes which                                                                   affects every system in your body, ie.   -Brain - dementia/stroke,  - eyes - glaucoma/blindness,  - heart - heart attack/heart failure,  - kidneys - dialysis,  - stomach - gastric paralysis,  - intestines - malabsorption,  - nerves - severe painful neuritis,  - circulation - gangrene & loss of a leg(s)  - and finally  . . . . . . . . . . . . . . . . . .    - cancer and  Alzheimers.  <><><><><><><><><><><><><><><><><><><><><><><><><><><><><><><><><> <><><><><><><><><><><><><><><><><><><><><><><><><><><><><><><><><>  -  -  Total  Chol =  216   - Elevated             (  Ideal  or  Goal is less than 180  !  )   - and   -  Bad / Dangerous LDL  Chol =  111  - also Elevated              (  Ideal  or  Goal is less than 70  !  )    - Cholesterol is too high - Recommend low cholesterol diet    - Cholesterol only comes from animal sources  - ie. meat, dairy, egg yolks  - Eat all the vegetables you want.  -  Avoid Meat, Avoid Meat,  Avoid Meat - especially Red Meat - Beef AND Pork .  - Avoid cheese & dairy - milk & ice cream.     - Cheese is the most concentrated form of trans-fats which  is the worst thing to clog up our arteries.   - Veggie cheese is OK which can be found in the fresh  produce section at [employer] or [employer] or [employer] <><><><><><><><><><><><><><><><><><><><><><><><><><><><><><><><><>  - And Triglycerides (   301   ) or fats in blood are too high  (goal is less than 150)    - Recommend avoid fried & greasy foods,  sweets / candy,   - Avoid white rice  (brown or wild rice or Quinoa is OK),   - Avoid white potatoes  (sweet potatoes are OK)   - Avoid anything made from white flour  - bagels, doughnuts, rolls, buns, biscuits, white and   wheat breads, pizza crust and traditional  pasta made of white flour & egg white  - (vegetarian pasta or spinach or wheat pasta is OK).    - Multi-grain bread is OK - like multi-grain flat bread or  sandwich thins.   - Avoid alcohol in excess.   - Exercise is also important.  <><><><><><><><><><><><><><><><><><><><><><><><><><><><><><><><><> <><><><><><><><><><><><><><><><><><><><><><><><><><><><><><><><><>  - Vitamin D= 54  is Low   - Vitamin D goal is between 70-100.   - Please INCREASE your Vitamin D to 10,000 units /day   - It is very important as a natural  anti-inflammatory and helping the  immune system protect against viral infections, like the Covid-19    helping hair, skin, and nails, as well as reducing stroke and  heart attack risk.   - It helps your bones and helps with mood.  - It also decreases numerous cancer risks so please  take it as directed.   - Low Vit D is associated with a 200-300% higher risk for  CANCER   and 200-300% higher risk for HEART   ATTACK  &  STROKE.    - It is also associated with higher death rate at younger ages,   autoimmune diseases like Rheumatoid arthritis, Lupus,  Multiple Sclerosis.     - Also many other serious conditions, like depression, Alzheimer's  Dementia, infertility, muscle aches, fatigue, fibromyalgia   - just to name a few. <><><><><><><><><><><><><><><><><><><><><><><><><><><><><><><><><>  -  All Else - CBC - Kidneys - Electrolytes - Liver - Magnesium & Thyroid    - all  Normal / OK <><><><><><><><><><><><><><><><><><><><><><><><><><><><><><><><><>  <><><><><><><><><><><><><><><><><><><><><><><><><><><><><><><><><>

## 2021-12-21 NOTE — Progress Notes (Signed)
Patient is aware of lab results and instructions. -e welch

## 2022-02-10 DIAGNOSIS — H524 Presbyopia: Secondary | ICD-10-CM | POA: Diagnosis not present

## 2022-02-10 DIAGNOSIS — Z0100 Encounter for examination of eyes and vision without abnormal findings: Secondary | ICD-10-CM | POA: Diagnosis not present

## 2022-03-01 ENCOUNTER — Other Ambulatory Visit: Payer: Self-pay

## 2022-03-01 DIAGNOSIS — E782 Mixed hyperlipidemia: Secondary | ICD-10-CM

## 2022-03-01 MED ORDER — ROSUVASTATIN CALCIUM 40 MG PO TABS: ORAL_TABLET | ORAL | 3 refills | Status: DC

## 2022-03-12 ENCOUNTER — Other Ambulatory Visit: Payer: Self-pay | Admitting: Internal Medicine

## 2022-03-12 DIAGNOSIS — F900 Attention-deficit hyperactivity disorder, predominantly inattentive type: Secondary | ICD-10-CM

## 2022-03-12 MED ORDER — METHYLPHENIDATE HCL ER (OSM) 72 MG PO TBCR: EXTENDED_RELEASE_TABLET | ORAL | 0 refills | Status: DC

## 2022-04-21 ENCOUNTER — Other Ambulatory Visit: Payer: Self-pay | Admitting: Internal Medicine

## 2022-04-21 MED ORDER — OZEMPIC (2 MG/DOSE) 8 MG/3ML ~~LOC~~ SOPN: PEN_INJECTOR | SUBCUTANEOUS | 1 refills | Status: DC

## 2022-06-04 ENCOUNTER — Ambulatory Visit: Payer: Medicare HMO | Admitting: Nurse Practitioner

## 2022-06-14 ENCOUNTER — Ambulatory Visit: Payer: Medicare HMO | Admitting: Nurse Practitioner

## 2022-06-14 ENCOUNTER — Ambulatory Visit: Payer: Medicare HMO | Admitting: Adult Health

## 2022-07-09 ENCOUNTER — Encounter: Payer: Self-pay | Admitting: Nurse Practitioner

## 2022-07-09 ENCOUNTER — Ambulatory Visit (INDEPENDENT_AMBULATORY_CARE_PROVIDER_SITE_OTHER): Payer: Medicare HMO | Admitting: Nurse Practitioner

## 2022-07-09 VITALS — BP 138/98 | HR 116 | Temp 96.9°F | Ht 66.5 in | Wt 209.0 lb

## 2022-07-09 DIAGNOSIS — I10 Essential (primary) hypertension: Secondary | ICD-10-CM | POA: Diagnosis not present

## 2022-07-09 DIAGNOSIS — E559 Vitamin D deficiency, unspecified: Secondary | ICD-10-CM

## 2022-07-09 DIAGNOSIS — R69 Illness, unspecified: Secondary | ICD-10-CM | POA: Diagnosis not present

## 2022-07-09 DIAGNOSIS — Z0000 Encounter for general adult medical examination without abnormal findings: Secondary | ICD-10-CM

## 2022-07-09 DIAGNOSIS — F325 Major depressive disorder, single episode, in full remission: Secondary | ICD-10-CM

## 2022-07-09 DIAGNOSIS — Z0001 Encounter for general adult medical examination with abnormal findings: Secondary | ICD-10-CM | POA: Diagnosis not present

## 2022-07-09 DIAGNOSIS — E1169 Type 2 diabetes mellitus with other specified complication: Secondary | ICD-10-CM

## 2022-07-09 DIAGNOSIS — E785 Hyperlipidemia, unspecified: Secondary | ICD-10-CM

## 2022-07-09 DIAGNOSIS — Z79899 Other long term (current) drug therapy: Secondary | ICD-10-CM | POA: Diagnosis not present

## 2022-07-09 DIAGNOSIS — N182 Chronic kidney disease, stage 2 (mild): Secondary | ICD-10-CM | POA: Diagnosis not present

## 2022-07-09 DIAGNOSIS — E1122 Type 2 diabetes mellitus with diabetic chronic kidney disease: Secondary | ICD-10-CM | POA: Diagnosis not present

## 2022-07-09 DIAGNOSIS — F900 Attention-deficit hyperactivity disorder, predominantly inattentive type: Secondary | ICD-10-CM | POA: Diagnosis not present

## 2022-07-09 DIAGNOSIS — R6889 Other general symptoms and signs: Secondary | ICD-10-CM | POA: Diagnosis not present

## 2022-07-09 DIAGNOSIS — E66811 Obesity, class 1: Secondary | ICD-10-CM

## 2022-07-09 DIAGNOSIS — E669 Obesity, unspecified: Secondary | ICD-10-CM | POA: Diagnosis not present

## 2022-07-09 MED ORDER — METHYLPHENIDATE HCL ER (OSM) 72 MG PO TBCR: EXTENDED_RELEASE_TABLET | ORAL | 0 refills | Status: DC

## 2022-07-09 NOTE — Progress Notes (Signed)
AWV and FOLLOW UP   Assessment and Plan:   Joseph Campbell was seen today for follow-up.  Diagnoses and all orders for this visit:  Annual Medicare Wellness Visit Due annually  Health maintenance reviewed Declines all vaccines today Advanced directive/living will paperwork given   Essential hypertension Continue current medications: Olmesartan 61m  Monitor blood pressure at home; call if consistently over 130/80 Continue DASH diet.   Reminder to go to the ER if any CP, SOB, nausea, dizziness, severe HA, changes vision/speech, left arm numbness and tingling and jaw pain.  CKD stage 3 due to type 2 diabetes mellitus ([place] Discussed how what you eat and drink can aide in kidney protection. Stay well hydrated. Avoid high salt foods. Avoid NSAIDS. Keep BP and BG well controlled.   Take medications as prescribed. Remain active and exercise as tolerated daily. Maintain weight.  Continue to monitor. Check CMP/GFR/Microablumin   Type 2 diabetes mellitus with stage 2 chronic kidney disease, without long-term current use of insulin (HCC) Stop Metformin Stop Semaglutide Injection Start Rybelsus 3 mg - sample provided.   Discussed general issues about diabetes pathophysiology and management. Education: Reviewed 'ABCs' of diabetes management (respective goals in parentheses):  A1C (<7), blood pressure (<130/80), and cholesterol (LDL <70) Dietary recommendations Encouraged aerobic exercise.  Discussed foot care, check daily Yearly retinal exam - forward report, has number to schedule Dental exam every 6 months Monitor blood glucose, discussed goal for patient  Hyperlipidemia associated with type 2 diabetes mellitus ([place] Discussed lifestyle modifications. Recommended diet heavy in fruits and veggies, omega 3's. Decrease consumption of animal meats, cheeses, and dairy products. Remain active and exercise as tolerated. Continue to monitor. Check lipids/TSH   Attention deficit  hyperactivity disorder (ADHD), predominantly inattentive type  ADD Continue ADD medication, helps with focus, no AE's. The patient was counseled on the addictive nature of the medication and was encouraged to take drug holidays when not needed.   Depression, major, in remission ([place] No medications  Discussed stress management techniques  Discussed, increase water,intake & good sleep hygiene  Discussed increasing exercise & vegetables in diet  Vitamin D deficiency Continue supplementation to maintain goal of 70-100 Monitor levels  Obesity - BM 34 Discussed appropriate BMI Diet modification. Physical activity. Encouraged/praised to build confidence.   Medication management All medications discussed and reviewed in full. All questions and concerns regarding medications addressed.    Orders Placed This Encounter  Procedures   CBC with Differential/Platelet   COMPLETE METABOLIC PANEL WITH GFR   Lipid panel   Hemoglobin A1c   Meds ordered this encounter  Medications   Methylphenidate HCl ER, OSM, 72 MG TBCR    Sig: TAKE 1 TABLET BY MOUTH DAILY IF NEEDED FOR ALERTNESS. DO NOT TAKE DAILY. IDEALLY<5 DAYS PER WEEK    Dispense:  30 tablet    Refill:  0     Notify office for further evaluation and treatment, questions or concerns if any reported s/s fail to improve.   The patient was advised to call back or seek an in-person evaluation if any symptoms worsen or if the condition fails to improve as anticipated.   Further disposition pending results of labs. Discussed med's effects and SE's.    I discussed the assessment and treatment plan with the patient. The patient was provided an opportunity to ask questions and all were answered. The patient agreed with the plan and demonstrated an understanding of the instructions.  Discussed med's effects and SE's. Screening labs and tests as requested  with regular follow-up as recommended.  I provided 35 minutes of face-to-face time  during this encounter including counseling, chart review, and critical decision making was preformed.    Future Appointments  Date Time Provider [place]  10/16/2022 10:00 AM Unk Pinto, MD [facility] None  01/08/2023 11:00 AM Darrol Jump, NP [facility] None     Plan:   During the course of the visit the patient was educated and counseled about appropriate screening and preventive services including:   Pneumococcal vaccine  Prevnar 13 Influenza vaccine Td vaccine Screening electrocardiogram Bone densitometry screening Colorectal cancer screening Diabetes screening Glaucoma screening Nutrition counseling  Advanced directives: requested   ----------------------------------------------------------------------------------------------------------------------  HPI 71 y.o. male  presents for 3 month follow up and AWV. He has ADD (attention deficit disorder); Vitamin D deficiency; Hyperlipidemia associated with type 2 diabetes mellitus ([place]); Hypertension; Depression, major, in remission ([place]); [id number] w/renal complication ([place]); Medication management; Obesity (BMI 30.0-34.9); CKD stage 2 due to type 2 diabetes mellitus ([place]); History of colon polyps; Onychomycosis; and At risk for medication nonadherence on their problem list.  Overall he reports feeling well today.  He has no new or additional concerns in clinic.   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  He has worked for [employer] for 43 years.   He had a slight elevation in calcium last blood work (11/2021).    Patient is on an ADD medication (methylphenidate 72 mg daily), he states that the medication is helping and he denies any adverse reactions. He estimates taking 5 day/week.   He reports that he has started CBD gummies to help with his energy, and mood.    BMI is Body mass index is 33.23 kg/m., he has been working on diet. Weight down since on ozempic. Has not been able to  administer over the last month d/t supply shortage and cost.  He was paying $700 out of pocket. His goal is 160s.  Wt Readings from Last 3 Encounters:  07/09/22 209 lb (94.8 kg)  12/19/21 207 lb 12.8 oz (94.3 kg)  09/19/21 216 lb 3.2 oz (98.1 kg)   His blood pressure has been controlled at home, today their BP is BP: (!) 138/98  He does workout. He denies chest pain, shortness of breath, dizziness.  Pulse is elevated today, reports he was rushing to get here.   He is on cholesterol medication (crestor 40 mg daily but admits forgets, estimates taking 50% of the time ) and denies myalgias. His cholesterol is not at goal of less than 70. The cholesterol last visit was:   Lab Results  Component Value Date   CHOL 216 (H) 12/19/2021   HDL 65 12/19/2021   LDLCALC 111 (H) 12/19/2021   TRIG 301 (H) 12/19/2021   CHOLHDL 3.3 12/19/2021    He has been working on diet and exercise for DMII- has been increasing steadily since 08/2018 With CKD on ARB With hyperlipidemia not at goal of less than 70, on crestor 45m on metformin - not currently taking, causes very frequent loose bowels and no longer wants to take.  He is no longer taking Farxiga 10 mg daily  Newly on ozempic taking 1 mg/week, done well but can no longer afford and short supply is leading him to noncompliance. He is checking sugars 2-3 x a day and as needed. Fasting range is 88-110s. denies foot ulcerations, increased appetite, nausea, paresthesia of the feet, polydipsia, polyuria, visual disturbances, vomiting and weight loss.  Last A1C in the office improved from 12.7 to:  Lab Results  Component Value Date   HGBA1C 7.6 (H) 12/19/2021   He has CKD II, on olmesartan: Declining. Lab Results  Component Value Date   GFRNONAA 61 08/08/2020   Patient is on Vitamin D supplement, on 50,000 IU but states hasn't taken in quite some time. Taking 8000 IU every other day.  Lab Results  Component Value Date   VD25OH 54 12/19/2021         Current Medications:   Current Outpatient Medications (Endocrine & Metabolic):    FARXIGA 10 MG TABS tablet, TAKE 1 TABLET BY MOUTH DAILY FOR DIABETES   metFORMIN (GLUCOPHAGE-XR) 500 MG 24 hr tablet, Take  2 tablets  2 x /day  with Meals for Diabetes.   Semaglutide, 2 MG/DOSE, (OZEMPIC, 2 MG/DOSE,) 8 MG/3ML SOPN, Inject  2 mg  into  Skin  every 7 days for Diabetes  ( Dx e11.29 )  Current Outpatient Medications (Cardiovascular):    olmesartan (BENICAR) 40 MG tablet, Take  1 tablet  Daily  for BP & Diabetic Kidney Protection                                             /                        TAKE         BY      MOUTH   rosuvastatin (CRESTOR) 40 MG tablet, TAKE 1 TABLET BY MOUTH DAILY FOR CHOLESTEROL   Current Outpatient Medications (Analgesics):    aspirin 81 MG chewable tablet, Chew by mouth daily.   Current Outpatient Medications (Other):    blood glucose meter kit and supplies KIT, Dispense based on patient's  insurance preference, [employer] Elite-E11.22   Cholecalciferol 50 MCG (2000 UT) CAPS, Take 8,000 Units by mouth every other day.   FREESTYLE LITE test strip, CHECK GLUCOSE THREE TIMES DAILY   ketoconazole (NIZORAL) 2 % cream, Apply 1 application topically 2 (two) times daily.   Lancets (FREESTYLE) lancets, TEST BLOOD SUGAR THREE TIMES DAILY   Methylphenidate HCl ER, OSM, 72 MG TBCR, TAKE 1 TABLET BY MOUTH DAILY IF NEEDED FOR ALERTNESS. DO NOT TAKE DAILY. IDEALLY<5 DAYS PER WEEK   Allergies:  Allergies  Allergen Reactions   Adderall [Amphetamine-Dextroamphet Er]     MOOD CHANGE   Lipitor [Atorvastatin]     MYALGIA   Other     FLU VACCINE   Wellbutrin [Bupropion]     HA   Zoloft [Sertraline Hcl]     AGGITATION     Medical History:  Past Medical History:  Diagnosis Date   ADD (attention deficit disorder)    Anxiety    Depression    Diabetes mellitus without complication ([place])    Hyperlipidemia    Hypertension    Unspecified vitamin D deficiency     Allergies:  Allergies  Allergen Reactions   Adderall [Amphetamine-Dextroamphet Er]     MOOD CHANGE   Lipitor [Atorvastatin]     MYALGIA   Other     FLU VACCINE   Wellbutrin [Bupropion]     HA   Zoloft [Sertraline Hcl]     AGGITATION   Surgical History:  He  has a past surgical history that includes Colonoscopy and Polypectomy. Family History:  Hisfamily history includes  Breast cancer in his mother; Hyperlipidemia in his brother and sister; Hypertension in his brother, father, mother, and sister. Social History:   reports that he quit smoking about 16 years ago. His smoking use included cigars. His smokeless tobacco use includes chew. He reports current alcohol use of about 7.0 standard drinks of alcohol per week. He reports that he does not use drugs.   Screening Tests: Immunization History  Administered Date(s) Administered   DT (Pediatric) 09/16/2014   PPD Test 09/16/2014, 10/28/2015   Pneumococcal-Unspecified 07/31/1999   Td 07/31/2003   Zoster, Live 05/26/2013    Preventative care: Last colonoscopy: 05/2019, 7 polyps, Dr. Henrene Pastor   TD or Tdap: 2016  Influenza: Declines Pneumococcal: would consider 20 valent, discuss next visit  Zoster 2014 Shingles/Zostavax: Discussed Covid 19 -2/2 2021, report requested  Names of Other Physician/Practitioners you currently use: 1. [facility] here for primary care 2. Eye Exam, 04/2022 with new Rx glasses 3.Dental exam: Dr. Gloriann Loan,   Patient Care Team: Unk Pinto, MD as PCP - General (Internal Medicine)  MEDICARE WELLNESS OBJECTIVES: Physical activity:   Cardiac risk factors:   Depression/mood screen:      07/09/2022   12:26 PM  Depression screen PHQ 2/9  Decreased Interest 0  Down, Depressed, Hopeless 0  PHQ - 2 Score 0    ADLs:     07/09/2022   12:26 PM 12/18/2021    8:21 PM  In your present state of health, do you have any difficulty performing the following  activities:  Hearing? 0 0  Vision? 0 0  Difficulty concentrating or making decisions? 0 0  Walking or climbing stairs? 0 0  Dressing or bathing? 0 0  Doing errands, shopping? 0 0  Preparing Food and eating ? N   Using the Toilet? N   In the past six months, have you accidently leaked urine? N   Do you have problems with loss of bowel control? N   Managing your Medications? N   Managing your Finances? N      Cognitive Testing  Alert? Yes  Normal Appearance?Yes  Oriented to person? Yes  Place? Yes   Time? Yes  Recall of three objects?  Yes  Can perform simple calculations? Yes  Displays appropriate judgment?Yes  Can read the correct time from a watch face?Yes  EOL planning: Does Patient Have a Medical Advance Directive?: No      Review of Systems:  Review of Systems  Constitutional:  Negative for malaise/fatigue and weight loss.  HENT:  Negative for hearing loss and tinnitus.   Eyes:  Negative for blurred vision and double vision.  Respiratory:  Negative for cough, shortness of breath and wheezing.   Cardiovascular:  Negative for chest pain, palpitations, orthopnea, claudication and leg swelling.  Gastrointestinal:  Negative for abdominal pain, blood in stool, constipation, diarrhea, heartburn, melena, nausea and vomiting.  Genitourinary: Negative.   Musculoskeletal:  Negative for joint pain and myalgias.  Skin:  Negative for rash.  Neurological:  Negative for dizziness, tingling, sensory change, weakness and headaches.  Endo/Heme/Allergies:  Negative for polydipsia.  Psychiatric/Behavioral: Negative.    All other systems reviewed and are negative.   Physical Exam: BP (!) 138/98   Pulse (!) 116   Temp (!) 96.9 F (36.1 C)   Ht 5' 6.5" (1.689 m)   Wt 209 lb (94.8 kg)   SpO2 95%   BMI 33.23 kg/m  Wt Readings from Last 3 Encounters:  07/09/22 209 lb (  94.8 kg)  12/19/21 207 lb 12.8 oz (94.3 kg)  09/19/21 216 lb 3.2 oz (98.1 kg)   General Appearance: Well  nourished, in no apparent distress. Eyes: PERRLA, EOMs, conjunctiva no swelling or erythema Sinuses: No Frontal/maxillary tenderness ENT/Mouth: Ext aud canals clear, TMs without erythema, bulging. Mouth and nose not examined- patient wearing a facemask  Hearing normal.  Neck: Supple, thyroid normal.  Respiratory: Respiratory effort normal, BS equal bilaterally without rales, rhonchi, wheezing or stridor.  Cardio: RRR with no MRGs. Brisk peripheral pulses without edema.  Abdomen: Soft distended, + BS. Obese Non tender, no guarding, rebound, hernias, masses. Lymphatics: Non tender without lymphadenopathy.  Musculoskeletal: Full ROM, 5/5 strength, Normal gait Skin: Warm, dry flaky, without rashes, lesions, ecchymosis.  Neuro: Cranial nerves intact. No cerebellar symptoms.  Psych: Awake and oriented X 3, normal affect, Insight and Judgment appropriate.    Medicare Attestation I have personally reviewed: The patient's medical and social history Their use of alcohol, tobacco or illicit drugs Their current medications and supplements The patient's functional ability including ADLs,fall risks, home safety risks, cognitive, and hearing and visual impairment Diet and physical activities Evidence for depression or mood disorders  The patient's weight, height, BMI, and visual acuity have been recorded in the chart.  I have made referrals, counseling, and provided education to the patient based on review of the above and I have provided the patient with a written personalized care plan for preventive services.      Darrol Jump, NP 12:26 PM [facility]

## 2022-07-10 ENCOUNTER — Other Ambulatory Visit: Payer: Self-pay | Admitting: Nurse Practitioner

## 2022-07-10 DIAGNOSIS — E871 Hypo-osmolality and hyponatremia: Secondary | ICD-10-CM

## 2022-07-10 LAB — CBC WITH DIFFERENTIAL/PLATELET
Absolute Monocytes: 561 cells/uL (ref 200–950)
Basophils Absolute: 32 cells/uL (ref 0–200)
Basophils Relative: 0.5 %
Eosinophils Absolute: 139 cells/uL (ref 15–500)
Eosinophils Relative: 2.2 %
HCT: 44.9 % (ref 38.5–50.0)
Hemoglobin: 15.7 g/dL (ref 13.2–17.1)
Lymphs Abs: 2111 cells/uL (ref 850–3900)
MCH: 31.8 pg (ref 27.0–33.0)
MCHC: 35 g/dL (ref 32.0–36.0)
MCV: 90.9 fL (ref 80.0–100.0)
MPV: 11 fL (ref 7.5–12.5)
Monocytes Relative: 8.9 %
Neutro Abs: 3459 cells/uL (ref 1500–7800)
Neutrophils Relative %: 54.9 %
Platelets: 286 10*3/uL (ref 140–400)
RBC: 4.94 10*6/uL (ref 4.20–5.80)
RDW: 12 % (ref 11.0–15.0)
Total Lymphocyte: 33.5 %
WBC: 6.3 10*3/uL (ref 3.8–10.8)

## 2022-07-10 LAB — COMPLETE METABOLIC PANEL WITH GFR
AG Ratio: 1.7 (calc) (ref 1.0–2.5)
ALT: 25 U/L (ref 9–46)
AST: 15 U/L (ref 10–35)
Albumin: 4.2 g/dL (ref 3.6–5.1)
Alkaline phosphatase (APISO): 76 U/L (ref 35–144)
BUN/Creatinine Ratio: 14 (calc) (ref 6–22)
BUN: 20 mg/dL (ref 7–25)
CO2: 26 mmol/L (ref 20–32)
Calcium: 9.2 mg/dL (ref 8.6–10.3)
Chloride: 99 mmol/L (ref 98–110)
Creat: 1.4 mg/dL — ABNORMAL HIGH (ref 0.70–1.28)
Globulin: 2.5 g/dL (calc) (ref 1.9–3.7)
Glucose, Bld: 333 mg/dL — ABNORMAL HIGH (ref 65–99)
Potassium: 5.1 mmol/L (ref 3.5–5.3)
Sodium: 133 mmol/L — ABNORMAL LOW (ref 135–146)
Total Bilirubin: 0.5 mg/dL (ref 0.2–1.2)
Total Protein: 6.7 g/dL (ref 6.1–8.1)
eGFR: 53 mL/min/{1.73_m2} — ABNORMAL LOW (ref >=60)

## 2022-07-10 LAB — HEMOGLOBIN A1C
Hgb A1c MFr Bld: 11.6 % of total Hgb — ABNORMAL HIGH (ref <5.7)
Mean Plasma Glucose: 286 mg/dL
eAG (mmol/L): 15.9 mmol/L

## 2022-07-10 LAB — LIPID PANEL
Cholesterol: 285 mg/dL — ABNORMAL HIGH (ref <200)
HDL: 63 mg/dL (ref >=40)
LDL Cholesterol (Calc): 181 mg/dL (calc) — ABNORMAL HIGH
Non-HDL Cholesterol (Calc): 222 mg/dL (calc) — ABNORMAL HIGH (ref <130)
Total CHOL/HDL Ratio: 4.5 (calc) (ref <5.0)
Triglycerides: 221 mg/dL — ABNORMAL HIGH (ref <150)

## 2022-07-16 ENCOUNTER — Ambulatory Visit (INDEPENDENT_AMBULATORY_CARE_PROVIDER_SITE_OTHER): Payer: Medicare HMO

## 2022-07-16 DIAGNOSIS — E871 Hypo-osmolality and hyponatremia: Secondary | ICD-10-CM

## 2022-07-16 NOTE — Progress Notes (Signed)
Patient presents to the office to have labs done to recheck kidney function.

## 2022-07-17 LAB — COMPLETE METABOLIC PANEL WITH GFR
AG Ratio: 1.7 (calc) (ref 1.0–2.5)
ALT: 17 U/L (ref 9–46)
AST: 10 U/L (ref 10–35)
Albumin: 4.3 g/dL (ref 3.6–5.1)
Alkaline phosphatase (APISO): 79 U/L (ref 35–144)
BUN/Creatinine Ratio: 13 (calc) (ref 6–22)
BUN: 18 mg/dL (ref 7–25)
CO2: 26 mmol/L (ref 20–32)
Calcium: 9.5 mg/dL (ref 8.6–10.3)
Chloride: 98 mmol/L (ref 98–110)
Creat: 1.39 mg/dL — ABNORMAL HIGH (ref 0.70–1.28)
Globulin: 2.6 g/dL (calc) (ref 1.9–3.7)
Glucose, Bld: 408 mg/dL — ABNORMAL HIGH (ref 65–99)
Potassium: 4.6 mmol/L (ref 3.5–5.3)
Sodium: 133 mmol/L — ABNORMAL LOW (ref 135–146)
Total Bilirubin: 0.6 mg/dL (ref 0.2–1.2)
Total Protein: 6.9 g/dL (ref 6.1–8.1)
eGFR: 54 mL/min/{1.73_m2} — ABNORMAL LOW (ref >=60)

## 2022-09-19 ENCOUNTER — Encounter: Payer: Medicare HMO | Admitting: Adult Health

## 2022-09-26 ENCOUNTER — Encounter: Payer: Medicare HMO | Admitting: Internal Medicine

## 2022-10-15 ENCOUNTER — Encounter: Payer: Self-pay | Admitting: Internal Medicine

## 2022-10-15 NOTE — Patient Instructions (Signed)

## 2022-10-15 NOTE — Progress Notes (Signed)
Annual  Screening/Preventative Visit  & Comprehensive Evaluation & Examination   Future Appointments  Date Time Provider Department  10/16/2022                      cpe 10:00 AM Unk Pinto, MD [facility]  01/08/2023                     wellness 11:00 AM Darrol Jump, NP [facility]  10/24/2023                     cpe 10:00 AM Unk Pinto, MD [facility]            This very nice 72 y.o. WWM  presents for a Screening /Preventative Visit & comprehensive evaluation and management of multiple medical co-morbidities.  Patient has been followed for HTN, HLD, T2_NIDDM  and Vitamin D Deficiency.        HTN predates since 1990's. Patient's BP has been controlled at home.  Today's  . Patient denies any cardiac symptoms as chest pain, palpitations, shortness of breath, dizziness or ankle swelling.        Patient's hyperlipidemia is not  controlled with diet and medications. Patient denies myalgias or other medication SE's. Last lipids were not at goal :  Lab Results  Component Value Date   CHOL 285 (H) 07/09/2022   HDL 63 07/09/2022   LDLCALC 181 (H) 07/09/2022   TRIG 221 (H) 07/09/2022   CHOLHDL 4.5 07/09/2022         Patient has hx/o T2_NIDDM (2010)   w/CKD3a (GFR 54 ) on Metformin, Farxiga & Ozempic and patient denies reactive hypoglycemic symptoms, visual blurring, diabetic polys or paresthesias. Last A1c was not at goal :    Lab Results  Component Value Date   HGBA1C 11.6 (H) 07/09/2022          Finally, patient has history of Vitamin D Deficiency ("26" /2008 )  and last vitamin D was near goal :   Lab Results  Component Value Date   VD25OH 46 12/19/2021     Current Outpatient Medications on File Prior to Visit  Medication Sig   aspirin 81 MG chewable tablet Chew by mouth daily.   blood glucose meter kit and supplies KIT Dispense based on patient's  insurance preference, [employer] Elite-E11.22   Cholecalciferol 50 MCG (2000 UT) CAPS Take 8,000 Units by  mouth every other day.   FARXIGA 10 MG TABS tablet TAKE 1 TABLET BY MOUTH DAILY FOR DIABETES   FREESTYLE LITE test strip CHECK GLUCOSE THREE TIMES DAILY   ketoconazole (NIZORAL) 2 % cream Apply 1 application topically 2 (two) times daily.   Lancets (FREESTYLE) lancets TEST BLOOD SUGAR THREE TIMES DAILY   metFORMIN (GLUCOPHAGE-XR) 500 MG 24 hr tablet Take  2 tablets  2 x /day  with Meals for Diabetes.   Methylphenidate HCl ER, OSM, 72 MG TBCR TAKE 1 TABLET BY MOUTH DAILY IF NEEDED FOR ALERTNESS. DO NOT TAKE DAILY. IDEALLY<5 DAYS PER WEEK   olmesartan (BENICAR) 40 MG tablet Take  1 tablet  Daily  for BP & Diabetic Kidney Protection                                             /  TAKE         BY      MOUTH   rosuvastatin (CRESTOR) 40 MG tablet TAKE 1 TABLET BY MOUTH DAILY FOR CHOLESTEROL   Semaglutide, 2 MG/DOSE, (OZEMPIC, 2 MG/DOSE,) 8 MG/3ML SOPN Inject  2 mg  into  Skin  every 7 days for Diabetes  ( Dx e11.29 )   No current facility-administered medications on file prior to visit.     Allergies  Allergen Reactions   Adderall [Amphetamine-Dextroamphet Er]     MOOD CHANGE   Lipitor [Atorvastatin]     MYALGIA   Other     FLU VACCINE   Wellbutrin [Bupropion]     HA   Zoloft [Sertraline Hcl]     AGGITATION     Past Medical History:  Diagnosis Date   ADD (attention deficit disorder)    Anxiety    Depression    Diabetes mellitus without complication ([place])    Hyperlipidemia    Hypertension    Unspecified vitamin D deficiency      Health Maintenance  Topic Date Due   COVID-19 Vaccine (1) Never done   OPHTHALMOLOGY EXAM  06/13/2013   Pneumonia Vaccine 76+ Years old (1 of 1 - PCV) 09/26/2013   INFLUENZA VACCINE  Never done   Diabetic kidney evaluation - Urine ACR  09/19/2022   FOOT EXAM  09/19/2022   HEMOGLOBIN A1C  01/08/2023   Medicare Annual Wellness (AWV)  07/10/2023   Diabetic kidney evaluation - eGFR measurement  07/17/2023   DTaP/Tdap/Td (3 -  Tdap) 09/16/2024   Hepatitis C Screening  Completed   HPV VACCINES  Aged Out   Zoster Vaccines- Shingrix  Discontinued     Immunization History  Administered Date(s) Administered   DT (Pediatric) 09/16/2014   PPD Test 09/16/2014, 10/28/2015   Pneumococcal-Unspecified 07/31/1999   Td 07/31/2003   Zoster, Live 05/26/2013     Last Colon - 06/18/2019 - Dr Henrene Pastor - #13 polyps - Recommended f/u in 1  year - OVERDUE  - Sent letter 09/07/2020 by Dr Henrene Pastor to schedule follow-up     Past Surgical History:  Procedure Laterality Date   COLONOSCOPY     POLYPECTOMY       Family History  Problem Relation Age of Onset   Hypertension Mother    Breast cancer Mother        32s or 29s   Hypertension Father    Hypertension Sister    Hyperlipidemia Sister    Hypertension Brother    Hyperlipidemia Brother    Colon cancer Neg Hx    Colon polyps Neg Hx    Esophageal cancer Neg Hx    Rectal cancer Neg Hx    Stomach cancer Neg Hx      Social History   Tobacco Use   Smoking status: Former    Types: Cigars    Quit date: 2007    Years since quitting: 17.2   Smokeless tobacco: Current    Types: Chew   Tobacco comments:    Cigars  Vaping Use   Vaping Use: Never used  Substance Use Topics   Alcohol use: Yes    Alcohol/week: 7.0 standard drinks of alcohol    Types: 7 Cans of beer per week   Drug use: No      ROS Constitutional: Denies fever, chills, weight loss/gain, headaches, insomnia,  night sweats or change in appetite. Does c/o fatigue. Eyes: Denies redness, blurred vision, diplopia, discharge, itchy or watery eyes.  ENT: Denies  discharge, congestion, post nasal drip, epistaxis, sore throat, earache, hearing loss, dental pain, Tinnitus, Vertigo, Sinus pain or snoring.  Cardio: Denies chest pain, palpitations, irregular heartbeat, syncope, dyspnea, diaphoresis, orthopnea, PND, claudication or edema Respiratory: denies cough, dyspnea, DOE, pleurisy, hoarseness, laryngitis or  wheezing.  Gastrointestinal: Denies dysphagia, heartburn, reflux, water brash, pain, cramps, nausea, vomiting, bloating, diarrhea, constipation, hematemesis, melena, hematochezia, jaundice or hemorrhoids Genitourinary: Denies dysuria, frequency, urgency, nocturia, hesitancy, discharge, hematuria or flank pain Musculoskeletal: Denies arthralgia, myalgia, stiffness, Jt. Swelling, pain, limp or strain/sprain. Denies Falls. Skin: Denies puritis, rash, hives, warts, acne, eczema or change in skin lesion Neuro: No weakness, tremor, incoordination, spasms, paresthesia or pain Psychiatric: Denies confusion, memory loss or sensory loss. Denies Depression. Endocrine: Denies change in weight, skin, hair change, nocturia, and paresthesia, diabetic polys, visual blurring or hyper / hypo glycemic episodes.  Heme/Lymph: No excessive bleeding, bruising or enlarged lymph nodes.   Physical Exam  There were no vitals taken for this visit.  General Appearance: Well nourished and well groomed and in no apparent distress.  Eyes: PERRLA, EOMs, conjunctiva no swelling or erythema, normal fundi and vessels. Sinuses: No frontal/maxillary tenderness ENT/Mouth: EACs patent / TMs  nl. Nares clear without erythema, swelling, mucoid exudates. Oral hygiene is good. No erythema, swelling, or exudate. Tongue normal, non-obstructing. Tonsils not swollen or erythematous. Hearing normal.  Neck: Supple, thyroid not palpable. No bruits, nodes or JVD. Respiratory: Respiratory effort normal.  BS equal and clear bilateral without rales, rhonci, wheezing or stridor. Cardio: Heart sounds are normal with regular rate and rhythm and no murmurs, rubs or gallops. Peripheral pulses are normal and equal bilaterally without edema. No aortic or femoral bruits. Chest: symmetric with normal excursions and percussion.  Abdomen: Soft, with Nl bowel sounds. Nontender, no guarding, rebound, hernias, masses, or organomegaly.  Lymphatics: Non tender  without lymphadenopathy.  Musculoskeletal: Full ROM all peripheral extremities, joint stability, 5/5 strength, and normal gait. Skin: Warm and dry without rashes, lesions, cyanosis, clubbing or  ecchymosis.  Neuro: Cranial nerves intact, reflexes equal bilaterally. Normal muscle tone, no cerebellar symptoms. Sensation intact.  Pysch: Alert and oriented X 3 with normal affect, insight and judgment appropriate.   Assessment and Plan  1. Annual Preventative/Screening Exam    2. Essential hypertension  - EKG 12-Lead - [place], RETROPERITNL ABD,  LTD - Urinalysis, Routine w reflex microscopic - Microalbumin / creatinine urine ratio - CBC with Differential/Platelet - COMPLETE METABOLIC PANEL WITH GFR - Magnesium - TSH  3. Hyperlipidemia associated with type 2 diabetes mellitus ([place])  - EKG 12-Lead - [place], RETROPERITNL ABD,  LTD - Lipid panel - TSH  4. Type 2 diabetes mellitus with stage 3a chronic kidney  disease, without long-term current use of insulin ([facility])  - EKG 12-Lead - [place], RETROPERITNL ABD,  LTD - HM DIABETES FOOT EXAM - PR LOW EXTEMITY NEUR EXAM DOCUM - PTH, intact and calcium - Hemoglobin A1c - Insulin, random  5. Vitamin D deficiency  - VITAMIN D 25 Hydroxy  6. Multiple polyps of sigmoid colon  - Ambulatory referral to Gastroenterology  7. Benign prostatic hyperplasia with lower urinary  tract symptoms, symptom details unspecified  - PSA  8. Screening for prostate cancer  - PSA  9. Screening for colorectal cancer  - Ambulatory referral to Gastroenterology  10. Prostate cancer screening   11. Screening for heart disease  - EKG 12-Lead  12. FHx: hypertension  - EKG 12-Lead - [place], RETROPERITNL ABD,  LTD  13. Screening for AAA (  aortic abdominal aneurysm)  - [place], RETROPERITNL ABD,  LTD  14. Medication management  - Urinalysis, Routine w reflex microscopic - Microalbumin / creatinine urine ratio - CBC with Differential/Platelet - COMPLETE  METABOLIC PANEL WITH GFR - Magnesium - Lipid panel - TSH - Hemoglobin A1c - Insulin, random - Ambulatory referral to Gastroenterology - VITAMIN D 25 Hydroxy            Patient was counseled in prudent diet, weight control to achieve/maintain BMI less than 25, BP monitoring, regular exercise and medications as discussed.  Discussed med effects and SE's. Routine screening labs and tests as requested with regular follow-up as recommended. Over 40 minutes of exam, counseling, chart review and high complex critical decision making was performed   Kirtland Bouchard, MD

## 2022-10-16 ENCOUNTER — Encounter: Payer: Self-pay | Admitting: Internal Medicine

## 2022-10-16 ENCOUNTER — Ambulatory Visit (INDEPENDENT_AMBULATORY_CARE_PROVIDER_SITE_OTHER): Payer: Medicare HMO | Admitting: Internal Medicine

## 2022-10-16 VITALS — BP 144/90 | HR 100 | Temp 97.6°F | Resp 16 | Ht 66.5 in | Wt 200.6 lb

## 2022-10-16 DIAGNOSIS — I10 Essential (primary) hypertension: Secondary | ICD-10-CM

## 2022-10-16 DIAGNOSIS — E1122 Type 2 diabetes mellitus with diabetic chronic kidney disease: Secondary | ICD-10-CM | POA: Diagnosis not present

## 2022-10-16 DIAGNOSIS — E785 Hyperlipidemia, unspecified: Secondary | ICD-10-CM | POA: Diagnosis not present

## 2022-10-16 DIAGNOSIS — E559 Vitamin D deficiency, unspecified: Secondary | ICD-10-CM | POA: Diagnosis not present

## 2022-10-16 DIAGNOSIS — Z0000 Encounter for general adult medical examination without abnormal findings: Secondary | ICD-10-CM

## 2022-10-16 DIAGNOSIS — Z125 Encounter for screening for malignant neoplasm of prostate: Secondary | ICD-10-CM

## 2022-10-16 DIAGNOSIS — Z136 Encounter for screening for cardiovascular disorders: Secondary | ICD-10-CM

## 2022-10-16 DIAGNOSIS — E1169 Type 2 diabetes mellitus with other specified complication: Secondary | ICD-10-CM | POA: Diagnosis not present

## 2022-10-16 DIAGNOSIS — Z0001 Encounter for general adult medical examination with abnormal findings: Secondary | ICD-10-CM

## 2022-10-16 DIAGNOSIS — Z79899 Other long term (current) drug therapy: Secondary | ICD-10-CM

## 2022-10-16 DIAGNOSIS — Z1211 Encounter for screening for malignant neoplasm of colon: Secondary | ICD-10-CM

## 2022-10-16 DIAGNOSIS — N401 Enlarged prostate with lower urinary tract symptoms: Secondary | ICD-10-CM | POA: Diagnosis not present

## 2022-10-16 DIAGNOSIS — N1831 Chronic kidney disease, stage 3a: Secondary | ICD-10-CM | POA: Diagnosis not present

## 2022-10-16 DIAGNOSIS — Z8249 Family history of ischemic heart disease and other diseases of the circulatory system: Secondary | ICD-10-CM

## 2022-10-16 DIAGNOSIS — I700 Atherosclerosis of aorta: Secondary | ICD-10-CM | POA: Diagnosis not present

## 2022-10-16 DIAGNOSIS — F900 Attention-deficit hyperactivity disorder, predominantly inattentive type: Secondary | ICD-10-CM

## 2022-10-16 DIAGNOSIS — K635 Polyp of colon: Secondary | ICD-10-CM

## 2022-10-16 MED ORDER — METFORMIN HCL ER 500 MG PO TB24: ORAL_TABLET | ORAL | 3 refills | Status: AC

## 2022-10-16 MED ORDER — SEMAGLUTIDE(0.25 OR 0.5MG/DOS) 2 MG/3ML ~~LOC~~ SOPN: PEN_INJECTOR | SUBCUTANEOUS | 0 refills | Status: DC

## 2022-10-16 MED ORDER — METHYLPHENIDATE HCL ER (OSM) 72 MG PO TBCR: EXTENDED_RELEASE_TABLET | ORAL | 0 refills | Status: AC

## 2022-10-17 LAB — COMPLETE METABOLIC PANEL WITH GFR
AG Ratio: 1.5 (calc) (ref 1.0–2.5)
ALT: 33 U/L (ref 9–46)
AST: 15 U/L (ref 10–35)
Albumin: 4.6 g/dL (ref 3.6–5.1)
Alkaline phosphatase (APISO): 91 U/L (ref 35–144)
BUN/Creatinine Ratio: 16 (calc) (ref 6–22)
BUN: 21 mg/dL (ref 7–25)
CO2: 22 mmol/L (ref 20–32)
Calcium: 10.4 mg/dL — ABNORMAL HIGH (ref 8.6–10.3)
Chloride: 97 mmol/L — ABNORMAL LOW (ref 98–110)
Creat: 1.35 mg/dL — ABNORMAL HIGH (ref 0.70–1.28)
Globulin: 3 g/dL (calc) (ref 1.9–3.7)
Glucose, Bld: 348 mg/dL — ABNORMAL HIGH (ref 65–99)
Potassium: 4.6 mmol/L (ref 3.5–5.3)
Sodium: 134 mmol/L — ABNORMAL LOW (ref 135–146)
Total Bilirubin: 0.7 mg/dL (ref 0.2–1.2)
Total Protein: 7.6 g/dL (ref 6.1–8.1)
eGFR: 55 mL/min/{1.73_m2} — ABNORMAL LOW (ref >=60)

## 2022-10-17 LAB — LIPID PANEL
Cholesterol: 321 mg/dL — ABNORMAL HIGH (ref <200)
HDL: 71 mg/dL (ref >=40)
LDL Cholesterol (Calc): 212 mg/dL (calc) — ABNORMAL HIGH
Non-HDL Cholesterol (Calc): 250 mg/dL (calc) — ABNORMAL HIGH (ref <130)
Total CHOL/HDL Ratio: 4.5 (calc) (ref <5.0)
Triglycerides: 196 mg/dL — ABNORMAL HIGH (ref <150)

## 2022-10-17 LAB — TSH: TSH: 3.4 mIU/L (ref 0.40–4.50)

## 2022-10-17 LAB — URINALYSIS, ROUTINE W REFLEX MICROSCOPIC
Bacteria, UA: NONE SEEN /HPF
Bilirubin Urine: NEGATIVE
Hgb urine dipstick: NEGATIVE
Hyaline Cast: NONE SEEN /LPF
Leukocytes,Ua: NEGATIVE
Nitrite: NEGATIVE
RBC / HPF: NONE SEEN /HPF (ref 0–2)
Specific Gravity, Urine: 1.027 (ref 1.001–1.035)
Squamous Epithelial / HPF: NONE SEEN /HPF (ref <=5)
pH: <=5 (ref 5.0–8.0)

## 2022-10-17 LAB — INSULIN, RANDOM: Insulin: 8.7 u[IU]/mL

## 2022-10-17 LAB — MICROALBUMIN / CREATININE URINE RATIO
Creatinine, Urine: 110 mg/dL (ref 20–320)
Microalb Creat Ratio: 34 mg/g creat — ABNORMAL HIGH (ref <30)
Microalb, Ur: 3.7 mg/dL

## 2022-10-17 LAB — CBC WITH DIFFERENTIAL/PLATELET
Absolute Monocytes: 801 cells/uL (ref 200–950)
Basophils Absolute: 42 cells/uL (ref 0–200)
Basophils Relative: 0.4 %
Eosinophils Absolute: 135 cells/uL (ref 15–500)
Eosinophils Relative: 1.3 %
HCT: 50.9 % — ABNORMAL HIGH (ref 38.5–50.0)
Hemoglobin: 17.3 g/dL — ABNORMAL HIGH (ref 13.2–17.1)
Lymphs Abs: 2298 cells/uL (ref 850–3900)
MCH: 30.5 pg (ref 27.0–33.0)
MCHC: 34 g/dL (ref 32.0–36.0)
MCV: 89.8 fL (ref 80.0–100.0)
MPV: 10.9 fL (ref 7.5–12.5)
Monocytes Relative: 7.7 %
Neutro Abs: 7124 cells/uL (ref 1500–7800)
Neutrophils Relative %: 68.5 %
Platelets: 382 10*3/uL (ref 140–400)
RBC: 5.67 10*6/uL (ref 4.20–5.80)
RDW: 11.8 % (ref 11.0–15.0)
Total Lymphocyte: 22.1 %
WBC: 10.4 10*3/uL (ref 3.8–10.8)

## 2022-10-17 LAB — HEMOGLOBIN A1C
Hgb A1c MFr Bld: 12.7 % of total Hgb — ABNORMAL HIGH (ref <5.7)
Mean Plasma Glucose: 318 mg/dL
eAG (mmol/L): 17.6 mmol/L

## 2022-10-17 LAB — PTH, INTACT AND CALCIUM
Calcium: 10.4 mg/dL — ABNORMAL HIGH (ref 8.6–10.3)
PTH: 26 pg/mL (ref 16–77)

## 2022-10-17 LAB — PSA: PSA: 3.13 ng/mL (ref <=4.00)

## 2022-10-17 LAB — VITAMIN D 25 HYDROXY (VIT D DEFICIENCY, FRACTURES): Vit D, 25-Hydroxy: 58 ng/mL (ref 30–100)

## 2022-10-17 LAB — MAGNESIUM: Magnesium: 1.9 mg/dL (ref 1.5–2.5)

## 2022-10-17 NOTE — Progress Notes (Signed)
<><><><><><><><><><><><><><><><><><><><><><><><><><><><><><><><><> <><><><><><><><><><><><><><><><><><><><><><><><><><><><><><><><><> - Test results slightly outside the reference range are not unusual. If there is anything important, I will review this with you,  otherwise it is considered normal test values.  If you have further questions,  please do not hesitate to contact me at the office or via My Chart.  <><><><><><><><><><><><><><><><><><><><><><><><><><><><><><><><><> <><><><><><><><><><><><><><><><><><><><><><><><><><><><><><><><><>  -  Blood Sugar = 348 mg% and A1c = 12.7%  - Both are Dangerously high since you stopped all of your diabetic medicines   - It would be ILLEGAL for you to operate a [employer]  ([employer])                      As you would be a danger not only to yourself ,                                                  but also to any other driver or passenger in your path !  <><><><><><><><><><><><><><><><><><><><><><><><><><><><><><><><><>  -   You are also at high risk for going into Diabetic Coma or having a seizure !    <><><><><><><><><><><><><><><><><><><><><><><><><><><><><><><><><>  -  Please restart you Metformin and Ozempic that                                                       were both sent into your Pharmacy yesterday.  <><><><><><><><><><><><><><><><><><><><><><><><><><><><><><><><><>  -  Recommend that you monitor your Blood sugars 2 x /day before Breakfast & Supper   & make a list of results & time & schedule an Office visit in 2 weeks to review the results.   <><><><><><><><><><><><><><><><><><><><><><><><><><><><><><><><><>  - Diet is STILL  VERY IMPORTANT !    Being diabetic has a  300% increased risk for heart attack,                                                       stroke, cancer, and alzheimer- type vascular dementia.                             ( Especially if your sugars are uncontrolled  !  )    It is very  important that you work harder with diet by                                                  avoiding all foods that are white except chicken, fish & calliflower.  - Avoid white rice  (brown & wild rice is OK),   - Avoid white potatoes  (sweet potatoes in moderation is OK),   White bread or wheat bread or anything made out of   white flour like bagels, donuts, rolls, buns, biscuits, cakes,  - pastries, cookies, pizza crust, and pasta (made from  white flour & egg whites)   - vegetarian pasta  or spinach or wheat pasta is OK.  - Multigrain breads like Arnold's, Pepperidge Farm or   multigrain sandwich thins or high fiber breads like   Eureka bread or "Dave's Killer" breads that are  4 to 5 grams fiber per slice !  are best.    Diet, exercise and weight loss can reverse and cure  diabetes in the early stages.    - Diet, exercise and weight loss is very important in the   control and prevention of complications of diabetes which  affects every system in your body, ie.   -Brain - dementia/stroke,  - eyes - glaucoma/blindness,  - heart - heart attack/heart failure,  - kidneys - dialysis,  - stomach - gastric paralysis,  - intestines - malabsorption,  - nerves - severe painful neuritis,  - circulation - gangrene & loss of a leg(s)  - and finally  . . . . . . . . . . . . . . . . . .    - cancer and Alzheimers. <><><><><><><><><><><><><><><><><><><><><><><><><><><><><><><><><> <><><><><><><><><><><><><><><><><><><><><><><><><><><><><><><><><>   - Total Chol =  321   is very very  high risk for Heart Attack /Stroke /Vascular Dementia     ( Ideal or Goal is less than 180 ! )  & - Bad /Dangerous LDL Chol =   212    - - >> Sitting on a time Bomb !     ( Ideal or Goal is less than 70 ! )    - The cause is Bad Diet !  - And  need to go ahead & start meds until get on a better diet to try &                                                          reverse  some of the Damage already done   - Read or listen to   Dr Alden Benjamin 's book    " How Not to Die ! "    - Recommend a stricter                plant based              low cholesterol                 diet   - Cholesterol only comes from animal sources                                                                                        - ie. meat, dairy, egg yolks  - Eat all the vegetables you want.  - Avoid Meat, Avoid Meat , Avoid Meat  ! ! !                                                                        -  especially red meat - Beef AND Pork  - Avoid cheese & dairy - milk & ice cream.   - Cheese is the most concentrated form of trans-fats which                                                                     is the worst thing to clog up our arteries.   - Veggie cheese is OK which can be found in                                                the fresh produce section at                                                              [employer] or [employer] or [employer] <><><><><><><><><><><><><><><><><><><><><><><><><><><><><><><><><> <><><><><><><><><><><><><><><><><><><><><><><><><><><><><><><><><>  -  PSA is up a little from  last year , but is still in normal range    - Suggest that it be rechecked in 6 months.                                                                                                                                  <><><><><><><><><><><><><><><><><><><><><><><><><><><><><><><><><> <><><><><><><><><><><><><><><><><><><><><><><><><><><><><><><><><>  - Vitamin D = 58 is "OK" - on the low side                                             - Recommend increase up to 10,000 units Vitamin D  /day   <><><><><><><><><><><><><><><><><><><><><><><><><><><><><><><><><> <><><><><><><><><><><><><><><><><><><><><><><><><><><><><><><><><>

## 2022-10-24 ENCOUNTER — Other Ambulatory Visit: Payer: Self-pay

## 2022-10-24 MED ORDER — OLMESARTAN MEDOXOMIL 40 MG PO TABS: ORAL_TABLET | ORAL | 3 refills | Status: DC

## 2022-12-13 ENCOUNTER — Other Ambulatory Visit: Payer: Self-pay

## 2022-12-13 ENCOUNTER — Telehealth: Payer: Self-pay | Admitting: Internal Medicine

## 2022-12-13 DIAGNOSIS — N1831 Chronic kidney disease, stage 3a: Secondary | ICD-10-CM

## 2022-12-13 MED ORDER — FREESTYLE LITE TEST VI STRP: ORAL_STRIP | 1 refills | Status: DC

## 2022-12-14 NOTE — Telephone Encounter (Signed)
MADE IN ERROR

## 2022-12-17 ENCOUNTER — Telehealth: Payer: Self-pay | Admitting: Nurse Practitioner

## 2022-12-17 DIAGNOSIS — N1831 Chronic kidney disease, stage 3a: Secondary | ICD-10-CM

## 2022-12-17 NOTE — Telephone Encounter (Signed)
Pt's insurance called saying that the freestyle [employer] is not covered but that they will cover either a OneTouch meter or Lifestyle meter. Whichever they chose to send in make sure there is a separate order for the meter and the strips

## 2022-12-18 ENCOUNTER — Other Ambulatory Visit: Payer: Self-pay | Admitting: Internal Medicine

## 2022-12-18 ENCOUNTER — Telehealth: Payer: Self-pay | Admitting: Nurse Practitioner

## 2022-12-18 DIAGNOSIS — N1831 Chronic kidney disease, stage 3a: Secondary | ICD-10-CM

## 2022-12-18 NOTE — Telephone Encounter (Addendum)
Pt was told by insurance that they will only cover OneTouch meter and strips, please send separate orders for both the his pharm on file

## 2022-12-19 ENCOUNTER — Other Ambulatory Visit: Payer: Self-pay

## 2022-12-19 DIAGNOSIS — N1831 Chronic kidney disease, stage 3a: Secondary | ICD-10-CM

## 2022-12-19 MED ORDER — FREESTYLE LITE TEST VI STRP: ORAL_STRIP | 12 refills | Status: AC

## 2022-12-19 MED ORDER — BLOOD GLUCOSE MONITOR KIT: PACK | 0 refills | Status: AC

## 2022-12-19 MED ORDER — BLOOD GLUCOSE MONITOR KIT: PACK | 0 refills | Status: DC

## 2022-12-19 MED ORDER — BLOOD GLUCOSE MONITORING SUPPL DEVI
0 refills | Status: AC
Start: 2022-12-19 — End: ?

## 2022-12-19 MED ORDER — LANCETS MISC: 11 refills | Status: AC

## 2022-12-19 NOTE — Addendum Note (Signed)
Addended by: Dionicio Stall on: 12/19/2022 11:54 AM   Modules accepted: Orders

## 2023-01-08 ENCOUNTER — Ambulatory Visit: Payer: Medicare HMO | Admitting: Nurse Practitioner

## 2023-01-20 ENCOUNTER — Other Ambulatory Visit: Payer: Self-pay | Admitting: Nurse Practitioner

## 2023-01-20 DIAGNOSIS — E1122 Type 2 diabetes mellitus with diabetic chronic kidney disease: Secondary | ICD-10-CM

## 2023-01-24 ENCOUNTER — Encounter: Payer: Self-pay | Admitting: Nurse Practitioner

## 2023-01-24 ENCOUNTER — Ambulatory Visit (INDEPENDENT_AMBULATORY_CARE_PROVIDER_SITE_OTHER): Payer: Medicare HMO | Admitting: Nurse Practitioner

## 2023-01-24 VITALS — BP 104/70 | HR 112 | Temp 97.5°F | Ht 66.5 in | Wt 211.6 lb

## 2023-01-24 DIAGNOSIS — F325 Major depressive disorder, single episode, in full remission: Secondary | ICD-10-CM

## 2023-01-24 DIAGNOSIS — I10 Essential (primary) hypertension: Secondary | ICD-10-CM

## 2023-01-24 DIAGNOSIS — E1169 Type 2 diabetes mellitus with other specified complication: Secondary | ICD-10-CM

## 2023-01-24 DIAGNOSIS — N182 Chronic kidney disease, stage 2 (mild): Secondary | ICD-10-CM

## 2023-01-24 DIAGNOSIS — B351 Tinea unguium: Secondary | ICD-10-CM

## 2023-01-24 DIAGNOSIS — E785 Hyperlipidemia, unspecified: Secondary | ICD-10-CM | POA: Diagnosis not present

## 2023-01-24 DIAGNOSIS — F900 Attention-deficit hyperactivity disorder, predominantly inattentive type: Secondary | ICD-10-CM

## 2023-01-24 DIAGNOSIS — Z0001 Encounter for general adult medical examination with abnormal findings: Secondary | ICD-10-CM | POA: Diagnosis not present

## 2023-01-24 DIAGNOSIS — E559 Vitamin D deficiency, unspecified: Secondary | ICD-10-CM | POA: Diagnosis not present

## 2023-01-24 DIAGNOSIS — R6889 Other general symptoms and signs: Secondary | ICD-10-CM

## 2023-01-24 DIAGNOSIS — N1831 Chronic kidney disease, stage 3a: Secondary | ICD-10-CM

## 2023-01-24 DIAGNOSIS — Z79899 Other long term (current) drug therapy: Secondary | ICD-10-CM

## 2023-01-24 DIAGNOSIS — E1122 Type 2 diabetes mellitus with diabetic chronic kidney disease: Secondary | ICD-10-CM

## 2023-01-24 DIAGNOSIS — Z0000 Encounter for general adult medical examination without abnormal findings: Secondary | ICD-10-CM

## 2023-01-24 DIAGNOSIS — E669 Obesity, unspecified: Secondary | ICD-10-CM

## 2023-01-24 LAB — CBC WITH DIFFERENTIAL/PLATELET
Eosinophils Absolute: 141 cells/uL (ref 15–500)
Eosinophils Relative: 1.5 %
Lymphs Abs: 2200 cells/uL (ref 850–3900)
MCHC: 33.8 g/dL (ref 32.0–36.0)
MPV: 9.8 fL (ref 7.5–12.5)
Monocytes Relative: 9.8 %

## 2023-01-24 NOTE — Progress Notes (Signed)
AWV and FOLLOW UP  Assessment and Plan:   Joseph Campbell was seen today for a wellness visit.  Diagnoses and all orders for this visit:  Annual Medicare Wellness Visit Due annually  Health maintenance reviewed Declines all vaccines today Advanced directive/living will paperwork given   Essential hypertension Discussed DASH (Dietary Approaches to Stop Hypertension) DASH diet is lower in sodium than a typical American diet. Cut back on foods that are high in saturated fat, cholesterol, and trans fats. Eat more whole-grain foods, fish, poultry, and nuts Remain active and exercise as tolerated daily.  Monitor BP at home-Call if greater than 130/80.  Check CMP/CBC  CKD stage 3 due to type 2 diabetes mellitus (HCC) Discussed how what you eat and drink can aide in kidney protection. Stay well hydrated. Avoid high salt foods. Avoid NSAIDS. Keep BP and BG well controlled.   Take medications as prescribed. Remain active and exercise as tolerated daily. Maintain weight.  Continue to monitor. Check CMP/GFR/Microablumin  Type 2 diabetes mellitus with stage 2 chronic kidney disease, without long-term current use of insulin (HCC) Continue Ozempic  Discussed general issues about diabetes pathophysiology and management. Education: Reviewed 'ABCs' of diabetes management (respective goals in parentheses):  A1C (<7), blood pressure (<130/80), and cholesterol (LDL <70) Dietary recommendations Encouraged aerobic exercise.  Discussed foot care, check daily Yearly retinal exam - forward report, has number to schedule Dental exam every 6 months Monitor blood glucose, discussed goal for patient  Hyperlipidemia associated with type 2 diabetes mellitus (HCC) Discussed lifestyle modifications. Recommended diet heavy in fruits and veggies, omega 3's. Decrease consumption of animal meats, cheeses, and dairy products. Remain active and exercise as tolerated. Continue to monitor. Check  lipids/TSH  Attention deficit hyperactivity disorder (ADHD), predominantly inattentive type  ADD Continue ADD medication, helps with focus, no AE's. The patient was counseled on the addictive nature of the medication and was encouraged to take drug holidays when not needed.   Depression, major, in remission (HCC) No medications  Discussed stress management techniques  Discussed, increase water,intake & good sleep hygiene  Discussed increasing exercise & vegetables in diet  Vitamin D deficiency Continue supplementation to maintain goal of 70-100 Monitor levels  Obesity - BM 34 Discussed appropriate BMI Diet modification. Physical activity. Encouraged/praised to build confidence.  Medication management All medications discussed and reviewed in full. All questions and concerns regarding medications addressed.    Onychomycosis Discussed proper hygiene, keeping feet dry Use of topical antifungal Continue to monitor  Orders Placed This Encounter  Procedures   CBC with Differential/Platelet   COMPLETE METABOLIC PANEL WITH GFR   Lipid panel   Hemoglobin A1c   Notify office for further evaluation and treatment, questions or concerns if any reported s/s fail to improve.   The patient was advised to call back or seek an in-person evaluation if any symptoms worsen or if the condition fails to improve as anticipated.   Further disposition pending results of labs. Discussed med's effects and SE's.    I discussed the assessment and treatment plan with the patient. The patient was provided an opportunity to ask questions and all were answered. The patient agreed with the plan and demonstrated an understanding of the instructions.  Discussed med's effects and SE's. Screening labs and tests as requested with regular follow-up as recommended.  I provided 35 minutes of face-to-face time during this encounter including counseling, chart review, and critical decision making was  preformed.  Future Appointments  Date Time Provider Department Center  04/29/2023  10:30 AM Lucky Cowboy, MD [facility] None  07/30/2023  9:30 AM Adela Glimpse, NP [facility] None  10/28/2023 10:00 AM Lucky Cowboy, MD [facility] None  01/27/2024 10:30 AM Adela Glimpse, NP [facility] None     Plan:   During the course of the visit the patient was educated and counseled about appropriate screening and preventive services including:   Pneumococcal vaccine  Prevnar 13 Influenza vaccine Td vaccine Screening electrocardiogram Bone densitometry screening Colorectal cancer screening Diabetes screening Glaucoma screening Nutrition counseling  Advanced directives: requested   ----------------------------------------------------------------------------------------------------------------------  HPI 72 y.o. male  presents for 3 month follow up and AWV. He has ADD (attention deficit disorder); Vitamin D deficiency; Hyperlipidemia associated with type 2 diabetes mellitus (HCC); Hypertension; Depression, major, in remission (HCC); T2_NIDDM w/renal complication (HCC); Medication management; Obesity (BMI 30.0-34.9); CKD stage 2 due to type 2 diabetes mellitus (HCC); History of colon polyps; Onychomycosis; and At risk for medication nonadherence on their problem list.  Overall he reports feeling well today.  He has no new or additional concerns in clinic.   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  He has worked for [employer] for 43 years.   He had a slight elevation in calcium last blood work (11/2021).    Patient is on an ADD medication (methylphenidate 72 mg daily), he states that the medication is helping and he denies any adverse reactions. He estimates taking 5 day/week.   He reports that he has started CBD gummies to help with his energy, and mood.   BMI is Body mass index is 33.64 kg/m., he has been working on diet. Weight down since on ozempic.   Wt Readings from Last 3 Encounters:  01/24/23 211 lb 9.6 oz (96 kg)  10/16/22 200 lb 9.6 oz (91 kg)  07/16/22 209 lb (94.8 kg)   His blood pressure has been controlled at home, today their BP is BP: 104/70  He does workout. He denies chest pain, shortness of breath, dizziness.  Pulse is elevated today, reports he was rushing to get here.   He is on cholesterol medication (crestor 40 mg daily but admits forgets, estimates taking 50% of the time ) and denies myalgias. His cholesterol is not at goal of less than 70. The cholesterol last visit was:   Lab Results  Component Value Date   CHOL 321 (H) 10/16/2022   HDL 71 10/16/2022   LDLCALC 212 (H) 10/16/2022   TRIG 196 (H) 10/16/2022   CHOLHDL 4.5 10/16/2022    He has been working on diet and exercise for DMII- has been increasing steadily since 08/2018 With CKD on ARB With hyperlipidemia not at goal of less than 70, on crestor 40mg  on metformin - not currently taking, causes very frequent loose bowels and no longer wants to take.  He is no longer taking Farxiga 10 mg daily  Newly on ozempic taking 1 mg/week, done well but can no longer afford and short supply is leading him to noncompliance. He is checking sugars 2-3 x a day and as needed. Fasting range is 88-110s. denies foot ulcerations, increased appetite, nausea, paresthesia of the feet, polydipsia, polyuria, visual disturbances, vomiting and weight loss.   Last A1C in the office improved from 12.7 to:  Lab Results  Component Value Date   HGBA1C 12.7 (H) 10/16/2022   He has CKD II, on olmesartan: Declining. Lab Results  Component Value Date   GFRNONAA 61 08/08/2020   Patient is on Vitamin D supplement,  on 50,000 IU but states hasn't taken in quite some time. Taking 8000 IU every other day.  Lab Results  Component Value Date   VD25OH 58 10/16/2022     Current Medications:   Current Outpatient Medications (Endocrine & Metabolic):    metFORMIN (GLUCOPHAGE-XR) 500 MG 24 hr  tablet, Take  2 tablets  2 x /day  with Meals for Diabetes.   OZEMPIC, 0.25 OR 0.5 MG/DOSE, 2 MG/3ML SOPN, INJECT 0.50 MG UNDER THE SKIN EVERY 7 DAYS FOR DIABETES  Current Outpatient Medications (Cardiovascular):    olmesartan (BENICAR) 40 MG tablet, Take  1 tablet  Daily  for BP & Diabetic Kidney Protection                                             /                        TAKE         BY      MOUTH   rosuvastatin (CRESTOR) 40 MG tablet, TAKE 1 TABLET BY MOUTH DAILY FOR CHOLESTEROL   Current Outpatient Medications (Analgesics):    aspirin 81 MG chewable tablet, Chew by mouth daily.   Current Outpatient Medications (Other):    blood glucose meter kit and supplies KIT, Test blood sugar once daily or as directed.   Blood Glucose Monitoring Suppl DEVI, Test blood sugar once daily or as directed.   Cholecalciferol 50 MCG (2000 UT) CAPS, Take 8,000 Units by mouth every other day.   glucose blood (FREESTYLE LITE) test strip, Test blood sugar once daily or as directed.   ketoconazole (NIZORAL) 2 % cream, Apply 1 application topically 2 (two) times daily.   Lancets MISC, Test blood sugar once daily or as directed.   Methylphenidate HCl ER, OSM, 72 MG TBCR, TAKE 1 TABLET BY MOUTH DAILY IF NEEDED FOR ALERTNESS. DO NOT TAKE DAILY. IDEALLY<5 DAYS PER WEEK   Allergies:  Allergies  Allergen Reactions   Adderall [Amphetamine-Dextroamphet Er]     MOOD CHANGE   Lipitor [Atorvastatin]     MYALGIA   Other     FLU VACCINE   Wellbutrin [Bupropion]     HA   Zoloft [Sertraline Hcl]     AGGITATION     Medical History:  Past Medical History:  Diagnosis Date   ADD (attention deficit disorder)    Anxiety    Depression    Diabetes mellitus without complication (HCC)    Hyperlipidemia    Hypertension    Unspecified vitamin D deficiency    Allergies:  Allergies  Allergen Reactions   Adderall [Amphetamine-Dextroamphet Er]     MOOD CHANGE   Lipitor [Atorvastatin]     MYALGIA   Other     FLU  VACCINE   Wellbutrin [Bupropion]     HA   Zoloft [Sertraline Hcl]     AGGITATION   Surgical History:  He  has a past surgical history that includes Colonoscopy and Polypectomy. Family History:  Hisfamily history includes Breast cancer in his mother; Hyperlipidemia in his brother and sister; Hypertension in his brother, father, mother, and sister. Social History:   reports that he quit smoking about 17 years ago. His smoking use included cigars. His smokeless tobacco use includes chew. He reports current alcohol use of about 7.0 standard drinks of alcohol per week.  He reports that he does not use drugs.   Screening Tests: Immunization History  Administered Date(s) Administered   DT (Pediatric) 09/16/2014   PPD Test 09/16/2014, 10/28/2015   Pneumococcal-Unspecified 07/31/1999   Td 07/31/2003   Zoster, Live 05/26/2013    Preventative care: Last colonoscopy: 05/2019, 7 polyps, Dr. Marina Goodell   TD or Tdap: 2016  Influenza: Declines Pneumococcal: would consider 20 valent, discuss next visit  Zoster 2014 Shingles/Zostavax: Discussed Covid 19 -2/2 2021, report requested  Names of Other Physician/Practitioners you currently use: 1. [facility] here for primary care 2. Eye Exam, 04/2022 with new Rx glasses 3.Dental exam: Dr. Alvester Morin,   Patient Care Team: Lucky Cowboy, MD as PCP - General (Internal Medicine)  MEDICARE WELLNESS OBJECTIVES: Physical activity:   Cardiac risk factors:   Depression/mood screen:      10/15/2022    9:26 PM  Depression screen PHQ 2/9  Decreased Interest 0  Down, Depressed, Hopeless 0  PHQ - 2 Score 0    ADLs:     10/15/2022    9:26 PM 07/09/2022   12:26 PM  In your present state of health, do you have any difficulty performing the following activities:  Hearing? 0 0  Vision? 0 0  Difficulty concentrating or making decisions? 0 0  Walking or climbing stairs? 0 0  Dressing or bathing? 0 0  Doing errands,  shopping? 0 0  Preparing Food and eating ?  N  Using the Toilet?  N  In the past six months, have you accidently leaked urine?  N  Do you have problems with loss of bowel control?  N  Managing your Medications?  N  Managing your Finances?  N     Cognitive Testing  Alert? Yes  Normal Appearance?Yes  Oriented to person? Yes  Place? Yes   Time? Yes  Recall of three objects?  Yes  Can perform simple calculations? Yes  Displays appropriate judgment?Yes  Can read the correct time from a watch face?Yes  EOL planning:        Review of Systems:  Review of Systems  Constitutional:  Negative for malaise/fatigue and weight loss.  HENT:  Negative for hearing loss and tinnitus.   Eyes:  Negative for blurred vision and double vision.  Respiratory:  Negative for cough, shortness of breath and wheezing.   Cardiovascular:  Negative for chest pain, palpitations, orthopnea, claudication and leg swelling.  Gastrointestinal:  Negative for abdominal pain, blood in stool, constipation, diarrhea, heartburn, melena, nausea and vomiting.  Genitourinary: Negative.   Musculoskeletal:  Negative for joint pain and myalgias.  Skin:  Negative for rash.  Neurological:  Negative for tingling, sensory change, weakness and headaches.  Endo/Heme/Allergies:  Negative for polydipsia.  Psychiatric/Behavioral: Negative.    All other systems reviewed and are negative.   Physical Exam: BP 104/70   Pulse (!) 112   Temp (!) 97.5 F (36.4 C)   Ht 5' 6.5" (1.689 m)   Wt 211 lb 9.6 oz (96 kg)   SpO2 97%   BMI 33.64 kg/m  Wt Readings from Last 3 Encounters:  01/24/23 211 lb 9.6 oz (96 kg)  10/16/22 200 lb 9.6 oz (91 kg)  07/16/22 209 lb (94.8 kg)   General Appearance: Well nourished, in no apparent distress. Eyes: PERRLA, EOMs, conjunctiva no swelling or erythema Sinuses: No Frontal/maxillary tenderness ENT/Mouth: Ext aud canals clear, TMs without erythema, bulging. Mouth and nose not examined- patient  wearing a facemask  Hearing normal.  Neck: Supple, thyroid normal.  Respiratory: Respiratory effort normal, BS equal bilaterally without rales, rhonchi, wheezing or stridor.  Cardio: RRR with no MRGs. Brisk peripheral pulses without edema.  Abdomen: Soft distended, + BS. Obese Non tender, no guarding, rebound, hernias, masses. Lymphatics: Non tender without lymphadenopathy.  Musculoskeletal: Full ROM, 5/5 strength, Normal gait Skin: Warm, dry flaky, without rashes, lesions, ecchymosis.  Neuro: Cranial nerves intact. No cerebellar symptoms.  Psych: Awake and oriented X 3, normal affect, Insight and Judgment appropriate.    Medicare Attestation I have personally reviewed: The patient's medical and social history Their use of alcohol, tobacco or illicit drugs Their current medications and supplements The patient's functional ability including ADLs,fall risks, home safety risks, cognitive, and hearing and visual impairment Diet and physical activities Evidence for depression or mood disorders  The patient's weight, height, BMI, and visual acuity have been recorded in the chart.  I have made referrals, counseling, and provided education to the patient based on review of the above and I have provided the patient with a written personalized care plan for preventive services.      Adela Glimpse, NP 11:07 AM [facility]

## 2023-01-24 NOTE — Patient Instructions (Signed)

## 2023-01-25 LAB — LIPID PANEL
Cholesterol: 297 mg/dL — ABNORMAL HIGH (ref <200)
HDL: 65 mg/dL (ref >=40)
LDL Cholesterol (Calc): 181 mg/dL (calc) — ABNORMAL HIGH
Non-HDL Cholesterol (Calc): 232 mg/dL (calc) — ABNORMAL HIGH (ref <130)
Total CHOL/HDL Ratio: 4.6 (calc) (ref <5.0)
Triglycerides: 299 mg/dL — ABNORMAL HIGH (ref <150)

## 2023-01-25 LAB — COMPLETE METABOLIC PANEL WITH GFR
AG Ratio: 1.7 (calc) (ref 1.0–2.5)
ALT: 22 U/L (ref 9–46)
AST: 16 U/L (ref 10–35)
Albumin: 4.8 g/dL (ref 3.6–5.1)
Alkaline phosphatase (APISO): 66 U/L (ref 35–144)
BUN/Creatinine Ratio: 11 (calc) (ref 6–22)
BUN: 18 mg/dL (ref 7–25)
CO2: 27 mmol/L (ref 20–32)
Calcium: 10.4 mg/dL — ABNORMAL HIGH (ref 8.6–10.3)
Chloride: 100 mmol/L (ref 98–110)
Creat: 1.59 mg/dL — ABNORMAL HIGH (ref 0.70–1.28)
Globulin: 2.8 g/dL (calc) (ref 1.9–3.7)
Glucose, Bld: 154 mg/dL — ABNORMAL HIGH (ref 65–99)
Potassium: 4.6 mmol/L (ref 3.5–5.3)
Sodium: 138 mmol/L (ref 135–146)
Total Bilirubin: 0.4 mg/dL (ref 0.2–1.2)
Total Protein: 7.6 g/dL (ref 6.1–8.1)
eGFR: 45 mL/min/{1.73_m2} — ABNORMAL LOW (ref >=60)

## 2023-01-25 LAB — CBC WITH DIFFERENTIAL/PLATELET
Absolute Monocytes: 921 cells/uL (ref 200–950)
Basophils Absolute: 47 cells/uL (ref 0–200)
Basophils Relative: 0.5 %
HCT: 47.4 % (ref 38.5–50.0)
Hemoglobin: 16 g/dL (ref 13.2–17.1)
MCH: 30.7 pg (ref 27.0–33.0)
MCV: 91 fL (ref 80.0–100.0)
Neutro Abs: 6091 cells/uL (ref 1500–7800)
Neutrophils Relative %: 64.8 %
Platelets: 363 10*3/uL (ref 140–400)
RBC: 5.21 10*6/uL (ref 4.20–5.80)
RDW: 12.6 % (ref 11.0–15.0)
Total Lymphocyte: 23.4 %
WBC: 9.4 10*3/uL (ref 3.8–10.8)

## 2023-01-25 LAB — HEMOGLOBIN A1C
Hgb A1c MFr Bld: 6.9 % of total Hgb — ABNORMAL HIGH (ref <5.7)
Mean Plasma Glucose: 151 mg/dL
eAG (mmol/L): 8.4 mmol/L

## 2023-04-28 ENCOUNTER — Encounter: Payer: Self-pay | Admitting: Internal Medicine

## 2023-04-28 NOTE — Progress Notes (Unsigned)
Future Appointments  Date Time Provider Department  04/29/2023                        6 mo  10:30 AM Lucky Cowboy, MD [facility]  07/30/2023                       9 mo   9:30 AM Adela Glimpse, NP [facility]  10/28/2023                         cpe 10:00 AM Lucky Cowboy, MD [facility]  01/27/2024                         wellness 10:30 AM Adela Glimpse, NP [facility]    History of Present Illness:       This very nice 72 y.o. WWM  with HTN, HLD, T2_NIDDM  and Vitamin D Deficiency who presents for 6 month follow up. Patient has inattentive ADD and uses Concerta with improved focus & concentration.         Patient is treated for HTN  since the 1990's  & BP has been controlled at home. Today's BP is at goal - 128/74. Patient has had no complaints of any cardiac type chest pain, palpitations, dyspnea / orthopnea / PND, dizziness, claudication, or dependent edema.        Hyperlipidemia is controlled with diet & meds. Patient denies myalgias or other med SE's. Last Lipids were not at goal :  Lab Results  Component Value Date   CHOL 297 (H) 01/24/2023   HDL 65 01/24/2023   LDLCALC 181 (H) 01/24/2023   TRIG 299 (H) 01/24/2023   CHOLHDL 4.6 01/24/2023     Also, the patient has history of T2_NIDDM  (2010) w/CKD 3a (GFR 45) on Metformin, Farxiga & Ozempic ( weight is down       # )  and has had no symptoms of reactive hypoglycemia, diabetic polys, paresthesias or visual blurring.  Last A1c was not at goal :  Lab Results  Component Value Date   HGBA1C 6.9 (H) 01/24/2023   Wt Readings from Last 3 Encounters:  01/24/23 211 lb 9.6 oz (96 kg)  10/16/22 200 lb 9.6 oz (91 kg)  07/16/22 209 lb (94.8 kg)                                                       Further, the patient also has history of Vitamin D Deficiency  ("26" /2008) and supplements vitamin D . Last vitamin D was at goal :  Lab Results  Component Value Date   VD25OH 58 10/16/2022       Current Outpatient  Medications on File Prior to Visit  Medication Sig   aspirin 81 MG  tablet  daily.   Cholecalciferol  2000 u Take 8,000 Units  every other day.   FARXIGA 10 MG TABS tablet TAKE 1 TABLET  DAILY    ketoconazole (NIZORAL) 2 % cream Apply  2 times daily.   metFORMIN-XR) 500 MG  Take  2 tablets  2 x /day  with Meals for Diabetes.   Methylphenidate  ER, OSM, 72 MG TBCR TAKE 1 TABLET  DAILY IF NEEDED  olmesartan  40 MG tablet Take  1 tablet  Daily     rosuvastatin 40 MG tablet TAKE 1 TABLET DAILY    Semaglutide, 4 MG/3ML SOPN Inject 1 mg into the skin once a week.      Allergies  Allergen Reactions   Adderall [Amphetamine-Dextroamphet Er] MOOD CHANGE   Lipitor [Atorvastatin] MYALGIA   Other FLU VACCINE   Wellbutrin [Bupropion] HA   Zoloft [Sertraline Hcl] AGGITATION    PMHx:   Past Medical History:  Diagnosis Date   ADD (attention deficit disorder)    Anxiety    Depression    Diabetes mellitus without complication (HCC)    Hyperlipidemia    Hypertension    Vitamin D deficiency      Immunization History  Administered Date(s) Administered   DT (Pediatric) 09/16/2014   PPD Test 09/16/2014, 10/28/2015   Pneumococcal - 23 07/31/1999   Td 07/31/2003   Zoster, Live 05/26/2013     Past Surgical History:  Procedure Laterality Date   COLONOSCOPY     POLYPECTOMY      FHx:    Reviewed / unchanged  SHx:    Reviewed / unchanged   Systems Review:  Constitutional: Denies fever, chills, wt changes, headaches, insomnia, fatigue, night sweats, change in appetite. Eyes: Denies redness, blurred vision, diplopia, discharge, itchy, watery eyes.  ENT: Denies discharge, congestion, post nasal drip, epistaxis, sore throat, earache, hearing loss, dental pain, tinnitus, vertigo, sinus pain, snoring.  CV: Denies chest pain, palpitations, irregular heartbeat, syncope, dyspnea, diaphoresis, orthopnea, PND, claudication or edema. Respiratory: denies cough, dyspnea, DOE, pleurisy,  hoarseness, laryngitis, wheezing.  Gastrointestinal: Denies dysphagia, odynophagia, heartburn, reflux, water brash, abdominal pain or cramps, nausea, vomiting, bloating, diarrhea, constipation, hematemesis, melena, hematochezia  or hemorrhoids. Genitourinary: Denies dysuria, frequency, urgency, nocturia, hesitancy, discharge, hematuria or flank pain. Musculoskeletal: Denies arthralgias, myalgias, stiffness, jt. swelling, pain, limping or strain/sprain.  Skin: Denies pruritus, rash, hives, warts, acne, eczema or change in skin lesion(s). Neuro: No weakness, tremor, incoordination, spasms, paresthesia or pain. Psychiatric: Denies confusion, memory loss or sensory loss. Endo: Denies change in weight, skin or hair change.  Heme/Lymph: No excessive bleeding, bruising or enlarged lymph nodes.  Physical Exam  There were no vitals taken for this visit.  Appears  Over nourished  and in no distress.  Eyes: PERRLA, EOMs, conjunctiva no swelling or erythema. Sinuses: No frontal/maxillary tenderness ENT/Mouth: EAC's clear, TM's nl w/o erythema, bulging. Nares clear w/o erythema, swelling, exudates. Oropharynx clear without erythema or exudates. Oral hygiene is good. Tongue normal, non obstructing. Hearing intact.  Neck: Supple. Thyroid not palpable. Car 2+/2+ without bruits, nodes or JVD. Chest: Respirations nl with BS clear & equal w/o rales, rhonchi, wheezing or stridor.  Cor: Heart sounds normal w/ regular rate and rhythm without sig. murmurs, gallops, clicks or rubs. Peripheral pulses normal and equal  without edema.  Abdomen: Soft & bowel sounds normal. Non-tender w/o guarding, rebound, hernias, masses or organomegaly.  Lymphatics: Unremarkable.  Musculoskeletal: Full ROM all peripheral extremities, joint stability, 5/5 strength and normal gait. (+) tender at apex of Lt shoulder at sternoclavicular jt.  After informed consent and aseptic prep - injected  at trigger point- 1 ml (10 mg dexamethasone )  & 1 ml Marcaine 0.5%  as trial to see if helps patient 's sx's ( no charge)   Skin: Warm, dry without exposed rashes, lesions or ecchymosis apparent.  Neuro: Cranial nerves intact, reflexes equal bilaterally. Sensory-motor testing grossly intact. Tendon reflexes grossly intact.  Pysch:  Alert & oriented x 3.  Insight and judgement nl & appropriate. No ideations.  Assessment and Plan:  1. Essential hypertension  - Continue medication, monitor blood pressure at home.  - Continue DASH diet.  Reminder to go to the ER if any CP,  SOB, nausea, dizziness, severe HA, changes vision/speech.   - CBC with Differential/Platelet - COMPLETE METABOLIC PANEL WITH GFR - Magnesium - TSH  2.  Hyperlipidemia associated with type 2 diabetes mellitus (HCC)  - Continue diet/meds, exercise,& lifestyle modifications.  - Continue monitor periodic cholesterol/liver & renal functions   - Lipid panel - TSH  3. Type 2 diabetes mellitus with stage 3a chronic kidney                                    disease, without long-term current use of insulin (HCC)  - Continue diet, exercise  - Lifestyle modifications.  - Monitor appropriate labs   - Hemoglobin A1c - Insulin, random  4. Vitamin D deficiency  - Continue supplementation   - VITAMIN D 25 Hydroxy   5. Medication management  - CBC with Differential/Platelet - COMPLETE METABOLIC PANEL WITH GFR - Magnesium - Lipid panel - TSH - Hemoglobin A1c - Insulin, random - VITAMIN D 25 Hydroxy         Discussed  regular exercise, BP monitoring, weight control to achieve/maintain BMI less than 25 and discussed med and SE's. Recommended labs to assess /monitor clinical status .  I discussed the assessment and treatment plan with the patient. The patient was provided an opportunity to ask questions and all were answered. The patient agreed with the plan and demonstrated an understanding of the instructions.  I provided over 30 minutes of exam, counseling,  chart review and  complex critical decision making.        The patient was advised to call back or seek an in-person evaluation if the symptoms worsen or if the condition fails to improve as anticipated.   Marinus Maw, MD

## 2023-04-28 NOTE — Patient Instructions (Signed)

## 2023-04-29 ENCOUNTER — Encounter: Payer: Self-pay | Admitting: Internal Medicine

## 2023-04-29 ENCOUNTER — Ambulatory Visit (INDEPENDENT_AMBULATORY_CARE_PROVIDER_SITE_OTHER): Payer: Medicare HMO | Admitting: Internal Medicine

## 2023-04-29 VITALS — BP 120/80 | HR 130 | Temp 97.9°F | Resp 17 | Ht 66.5 in | Wt 220.6 lb

## 2023-04-29 DIAGNOSIS — I10 Essential (primary) hypertension: Secondary | ICD-10-CM | POA: Diagnosis not present

## 2023-04-29 DIAGNOSIS — E1122 Type 2 diabetes mellitus with diabetic chronic kidney disease: Secondary | ICD-10-CM | POA: Diagnosis not present

## 2023-04-29 DIAGNOSIS — N1831 Chronic kidney disease, stage 3a: Secondary | ICD-10-CM | POA: Diagnosis not present

## 2023-04-29 DIAGNOSIS — E559 Vitamin D deficiency, unspecified: Secondary | ICD-10-CM | POA: Diagnosis not present

## 2023-04-29 DIAGNOSIS — Z79899 Other long term (current) drug therapy: Secondary | ICD-10-CM | POA: Diagnosis not present

## 2023-04-29 DIAGNOSIS — E1169 Type 2 diabetes mellitus with other specified complication: Secondary | ICD-10-CM

## 2023-04-29 DIAGNOSIS — E785 Hyperlipidemia, unspecified: Secondary | ICD-10-CM | POA: Diagnosis not present

## 2023-04-29 MED ORDER — OZEMPIC (1 MG/DOSE) 4 MG/3ML ~~LOC~~ SOPN
PEN_INJECTOR | SUBCUTANEOUS | 0 refills | Status: AC
Start: 2023-04-29 — End: ?

## 2023-04-30 ENCOUNTER — Other Ambulatory Visit: Payer: Self-pay | Admitting: Internal Medicine

## 2023-04-30 LAB — CBC WITH DIFFERENTIAL/PLATELET
Absolute Monocytes: 864 {cells}/uL (ref 200–950)
Basophils Absolute: 48 {cells}/uL (ref 0–200)
Basophils Relative: 0.5 %
Eosinophils Absolute: 173 {cells}/uL (ref 15–500)
Eosinophils Relative: 1.8 %
HCT: 48.5 % (ref 38.5–50.0)
Hemoglobin: 16.1 g/dL (ref 13.2–17.1)
Lymphs Abs: 3072 {cells}/uL (ref 850–3900)
MCH: 30.8 pg (ref 27.0–33.0)
MCHC: 33.2 g/dL (ref 32.0–36.0)
MCV: 92.7 fL (ref 80.0–100.0)
MPV: 10.2 fL (ref 7.5–12.5)
Monocytes Relative: 9 %
Neutro Abs: 5443 {cells}/uL (ref 1500–7800)
Neutrophils Relative %: 56.7 %
Platelets: 420 10*3/uL — ABNORMAL HIGH (ref 140–400)
RBC: 5.23 10*6/uL (ref 4.20–5.80)
RDW: 12 % (ref 11.0–15.0)
Total Lymphocyte: 32 %
WBC: 9.6 10*3/uL (ref 3.8–10.8)

## 2023-04-30 LAB — LIPID PANEL
Cholesterol: 226 mg/dL — ABNORMAL HIGH (ref <200)
HDL: 59 mg/dL (ref >=40)
LDL Cholesterol (Calc): 119 mg/dL — ABNORMAL HIGH
Non-HDL Cholesterol (Calc): 167 mg/dL — ABNORMAL HIGH (ref <130)
Total CHOL/HDL Ratio: 3.8 (calc) (ref <5.0)
Triglycerides: 339 mg/dL — ABNORMAL HIGH (ref <150)

## 2023-04-30 LAB — COMPLETE METABOLIC PANEL WITH GFR
AG Ratio: 1.5 (calc) (ref 1.0–2.5)
ALT: 15 U/L (ref 9–46)
AST: 12 U/L (ref 10–35)
Albumin: 4.8 g/dL (ref 3.6–5.1)
Alkaline phosphatase (APISO): 72 U/L (ref 35–144)
BUN/Creatinine Ratio: 15 (calc) (ref 6–22)
BUN: 21 mg/dL (ref 7–25)
CO2: 25 mmol/L (ref 20–32)
Calcium: 11 mg/dL — ABNORMAL HIGH (ref 8.6–10.3)
Chloride: 100 mmol/L (ref 98–110)
Creat: 1.43 mg/dL — ABNORMAL HIGH (ref 0.70–1.28)
Globulin: 3.1 g/dL (ref 1.9–3.7)
Glucose, Bld: 201 mg/dL — ABNORMAL HIGH (ref 65–99)
Potassium: 4.8 mmol/L (ref 3.5–5.3)
Sodium: 136 mmol/L (ref 135–146)
Total Bilirubin: 0.4 mg/dL (ref 0.2–1.2)
Total Protein: 7.9 g/dL (ref 6.1–8.1)
eGFR: 51 mL/min/{1.73_m2} — ABNORMAL LOW (ref >=60)

## 2023-04-30 LAB — HEMOGLOBIN A1C
Hgb A1c MFr Bld: 8.1 %{Hb} — ABNORMAL HIGH (ref <5.7)
Mean Plasma Glucose: 186 mg/dL
eAG (mmol/L): 10.3 mmol/L

## 2023-04-30 LAB — VITAMIN D 25 HYDROXY (VIT D DEFICIENCY, FRACTURES): Vit D, 25-Hydroxy: 39 ng/mL (ref 30–100)

## 2023-04-30 LAB — INSULIN, RANDOM: Insulin: 29.4 u[IU]/mL — ABNORMAL HIGH

## 2023-04-30 LAB — MAGNESIUM: Magnesium: 1.8 mg/dL (ref 1.5–2.5)

## 2023-04-30 LAB — TSH: TSH: 4.6 m[IU]/L — ABNORMAL HIGH (ref 0.40–4.50)

## 2023-04-30 MED ORDER — OLMESARTAN MEDOXOMIL 40 MG PO TABS: ORAL_TABLET | ORAL | 3 refills | Status: AC

## 2023-04-30 NOTE — Progress Notes (Signed)
<>*<>*<>*<>*<>*<>*<>*<>*<>*<>*<>*<>*<>*<>*<>*<>*<>*<>*<>*<>*<>*<>*<>*<>*<> <>*<>*<>*<>*<>*<>*<>*<>*<>*<>*<>*<>*<>*<>*<>*<>*<>*<>*<>*<>*<>*<>*<>*<>*<>  -  Random Glucose is up to 201 mg% -& is  too high                                                                               ( Ideal or goal is less than 120 mg% )    - Also, A1c= 8.1% - way too high                                                ( goal is less than 5.7%  )   So need to work harder with diet & weight loss   - So . . . . Marland Kitchen - Avoid Sweets, Candy & White Stuff   - White Rice, [place], [place] Flour  - Breads &  Pasta  <>*<>*<>*<>*<>*<>*<>*<>*<>*<>*<>*<>*<>*<>*<>*<>*<>*<>*<>*<>*<>*<>*<>*<>*<> <>*<>*<>*<>*<>*<>*<>*<>*<>*<>*<>*<>*<>*<>*<>*<>*<>*<>*<>*<>*<>*<>*<>*<>*<>  -Chol = 226 - also way too high                                    ( goal is less than 180   Bad LDL Chol = 119                                   ( goal is less than 70 )   - Cholesterol is too high - Recommend low cholesterol diet   - Cholesterol only comes from animal sources                                                                                                    - ie. meat, dairy, egg yolks  - Eat all the vegetables you want.                                      - Avoid Meat, Avoid Meat,  Avoid Meat                                                                              - especially Red Meat - Beef AND Pork .  - Avoid cheese & dairy - milk & ice cream.     - Cheese is the most  concentrated form of trans-fats which                                                                              is the worst thing to clog up our arteries.    - Veggie cheese is OK which can be found in the fresh                                   produce section at [employer] or [employer] or  [employer]  <>*<>*<>*<>*<>*<>*<>*<>*<>*<>*<>*<>*<>*<>*<>*<>*<>*<>*<>*<>*<>*<>*<>*<>*<> <>*<>*<>*<>*<>*<>*<>*<>*<>*<>*<>*<>*<>*<>*<>*<>*<>*<>*<>*<>*<>*<>*<>*<>*<>  -   Magnesium = 1.8    is very low  very  low- goal is betw 2.0 - 2.5,   - So..............Marland Kitchen  Recommend that you take  Magnesium 500 mg tablet daily   - also important to eat lots of  leafy green vegetables   - spinach - Kale - collards - greens - okra - asparagus  - broccoli - quinoa - squash - almonds   - black, red, white beans  -  peas - green beans

## 2023-05-08 ENCOUNTER — Other Ambulatory Visit: Payer: Self-pay | Admitting: Internal Medicine

## 2023-05-08 DIAGNOSIS — E782 Mixed hyperlipidemia: Secondary | ICD-10-CM

## 2023-05-08 MED ORDER — ROSUVASTATIN CALCIUM 40 MG PO TABS: ORAL_TABLET | ORAL | 3 refills | Status: AC

## 2023-07-29 NOTE — Progress Notes (Deleted)
 FOLLOW UP  Assessment and Plan:   Joseph Campbell was seen today for a follow up  Diagnoses and all orders for this visit:  Essential hypertension Discussed DASH (Dietary Approaches to Stop Hypertension) DASH diet is lower in sodium than a typical American diet. Cut back on foods that are high in saturated fat, cholesterol, and trans fats. Eat more whole-grain foods, fish, poultry, and nuts Remain active and exercise as tolerated daily.  Monitor BP at home-Call if greater than 130/80.  Check CMP/CBC  CKD stage 3 due to type 2 diabetes mellitus (HCC) Discussed how what you eat and drink can aide in kidney protection. Stay well hydrated. Avoid high salt foods. Avoid NSAIDS. Keep BP and BG well controlled.   Take medications as prescribed. Remain active and exercise as tolerated daily. Maintain weight.  Continue to monitor. Check CMP/GFR/Microablumin  Type 2 diabetes mellitus with stage 2 chronic kidney disease, without long-term current use of insulin  (HCC) Continue Ozempic   Discussed general issues about diabetes pathophysiology and management. Education: Reviewed 'ABCs' of diabetes management (respective goals in parentheses):  A1C (<7), blood pressure (<130/80), and cholesterol (LDL <70) Dietary recommendations Encouraged aerobic exercise.  Discussed foot care, check daily Yearly retinal exam - forward report, has number to schedule Dental exam every 6 months Monitor blood glucose, discussed goal for patient  Hyperlipidemia associated with type 2 diabetes mellitus (HCC) Discussed lifestyle modifications. Recommended diet heavy in fruits and veggies, omega 3's. Decrease consumption of animal meats, cheeses, and dairy products. Remain active and exercise as tolerated. Continue to monitor. Check lipids/TSH  Attention deficit hyperactivity disorder (ADHD), predominantly inattentive type  ADD Continue ADD medication, helps with focus, no AE's. The patient was counseled on the  addictive nature of the medication and was encouraged to take drug holidays when not needed.   Depression, major, in remission (HCC) No medications  Discussed stress management techniques  Discussed, increase water,intake & good sleep hygiene  Discussed increasing exercise & vegetables in diet  Vitamin D  deficiency Continue supplementation to maintain goal of 70-100 Monitor levels  Obesity - BM 34 Discussed appropriate BMI Diet modification. Physical activity. Encouraged/praised to build confidence.  Medication management All medications discussed and reviewed in full. All questions and concerns regarding medications addressed.    Onychomycosis Discussed proper hygiene, keeping feet dry Use of topical antifungal Continue to monitor  No orders of the defined types were placed in this encounter.  Notify office for further evaluation and treatment, questions or concerns if any reported s/s fail to improve.   The patient was advised to call back or seek an in-person evaluation if any symptoms worsen or if the condition fails to improve as anticipated.   Further disposition pending results of labs. Discussed med's effects and SE's.    I discussed the assessment and treatment plan with the patient. The patient was provided an opportunity to ask questions and all were answered. The patient agreed with the plan and demonstrated an understanding of the instructions.  Discussed med's effects and SE's. Screening labs and tests as requested with regular follow-up as recommended.  I provided 35 minutes of face-to-face time during this encounter including counseling, chart review, and critical decision making was preformed.  Future Appointments  Date Time Provider Department Center  07/30/2023  9:30 AM Laurice President, NP [facility] None  10/28/2023 10:00 AM Tonita Elsie, MD [facility] None  01/29/2024 10:30 AM Laurice President, NP [facility] None     Plan:   During the course of  the visit  the patient was educated and counseled about appropriate screening and preventive services including:   Pneumococcal vaccine  Prevnar 13 Influenza vaccine Td vaccine Screening electrocardiogram Bone densitometry screening Colorectal cancer screening Diabetes screening Glaucoma screening Nutrition counseling  Advanced directives: requested   ----------------------------------------------------------------------------------------------------------------------  HPI Joseph Campbell  presents for 3 month follow up and AWV. He has ADD (attention deficit disorder); Vitamin D  deficiency; Hyperlipidemia associated with type 2 diabetes mellitus (HCC); Hypertension; Depression, major, in remission (HCC); T2_NIDDM w/renal complication (HCC); Medication management; Obesity (BMI 30.0-34.9); CKD stage 2 due to type 2 diabetes mellitus (HCC); History of colon polyps; Onychomycosis; and At risk for medication nonadherence on their problem list.  Overall he reports feeling well today.  He has no new or additional concerns in clinic.   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  He has worked for [employer] for 43 years.   He had a slight elevation in calcium  last blood work (11/2021).    Patient is on an ADD medication (methylphenidate  72 mg daily), he states that the medication is helping and he denies any adverse reactions. He estimates taking 5 day/week.   He reports that he has started CBD gummies to help with his energy, and mood.   BMI is There is no height or weight on file to calculate BMI., he has been working on diet. Weight down since on ozempic .  Wt Readings from Last 3 Encounters:  04/29/23 220 lb 9.6 oz (100.1 kg)  01/24/23 211 lb 9.6 oz (96 kg)  10/16/22 200 lb 9.6 oz (91 kg)   His blood pressure has been controlled at home, today their BP is    He does workout. He denies chest pain, shortness of breath, dizziness.  Pulse is elevated today, reports he  was rushing to get here.   He is on cholesterol medication (crestor  40 mg daily but admits forgets, estimates taking 50% of the time ) and denies myalgias. His cholesterol is not at goal of less than 70. The cholesterol last visit was:   Lab Results  Component Value Date   CHOL 226 (H) 04/29/2023   HDL 59 04/29/2023   LDLCALC 119 (H) 04/29/2023   TRIG 339 (H) 04/29/2023   CHOLHDL 3.8 04/29/2023    He has been working on diet and exercise for DMII- has been increasing steadily since 08/2018 With CKD on ARB With hyperlipidemia not at goal of less than 70, on crestor  40mg  on metformin  - not currently taking, causes very frequent loose bowels and no longer wants to take.  He is no longer taking Farxiga  10 mg daily  Newly on ozempic  taking 1 mg/week, done well but can no longer afford and short supply is leading him to noncompliance. He is checking sugars 2-3 x a day and as needed. Fasting range is 88-110s. denies foot ulcerations, increased appetite, nausea, paresthesia of the feet, polydipsia, polyuria, visual disturbances, vomiting and weight loss.   Last A1C in the office improved from 12.7 to:  Lab Results  Component Value Date   HGBA1C 8.1 (H) 04/29/2023   He has CKD II, on olmesartan : Declining. Lab Results  Component Value Date   GFRNONAA 61 08/08/2020   Patient is on Vitamin D  supplement, on 50,000 IU but states hasn't taken in quite some time. Taking 8000 IU every other day.  Lab Results  Component Value Date   VD25OH 39 04/29/2023     Current Medications:   Current Outpatient Medications (Endocrine &  Metabolic):    metFORMIN  (GLUCOPHAGE -XR) 500 MG 24 hr tablet, Take  2 tablets  2 x /day  with Meals for Diabetes.   Semaglutide , 1 MG/DOSE, (OZEMPIC , 1 MG/DOSE,) 4 MG/3ML SOPN, Inject 1 mg (0.75 ml)     into the Skin      Once Weekly     for Diabetes  Current Outpatient Medications (Cardiovascular):    olmesartan  (BENICAR ) 40 MG tablet, Take  1 tablet  Daily  for BP &  Diabetic Kidney Protection                                             /                        TAKE         BY      MOUTH   rosuvastatin  (CRESTOR ) 40 MG tablet, Take  1 tablet  Daily  for Cholesterol                                       /                                                                   TAKE                                         BY                                                 MOUTH   Current Outpatient Medications (Analgesics):    aspirin 81 MG chewable tablet, Chew by mouth daily.   Current Outpatient Medications (Other):    blood glucose meter kit and supplies KIT, Test blood sugar once daily or as directed.   Blood Glucose Monitoring Suppl DEVI, Test blood sugar once daily or as directed.   Cholecalciferol  50 MCG (2000 UT) CAPS, Take 8,000 Units by mouth every other day.   glucose blood (FREESTYLE LITE) test strip, Test blood sugar once daily or as directed.   ketoconazole  (NIZORAL ) 2 % cream, Apply 1 application topically 2 (two) times daily.   Lancets MISC, Test blood sugar once daily or as directed.   Methylphenidate  HCl ER, OSM, 72 MG TBCR, TAKE 1 TABLET BY MOUTH DAILY IF NEEDED FOR ALERTNESS. DO NOT TAKE DAILY. IDEALLY<5 DAYS PER WEEK   Allergies:  Allergies  Allergen Reactions   Adderall [Amphetamine-Dextroamphet Er]     MOOD CHANGE   Lipitor [Atorvastatin]     MYALGIA   Other     FLU VACCINE   Wellbutrin [Bupropion]     HA   Zoloft [Sertraline Hcl]     AGGITATION     Medical History:  Past Medical History:  Diagnosis Date   ADD (attention deficit  disorder)    Anxiety    Depression    Diabetes mellitus without complication (HCC)    Hyperlipidemia    Hypertension    Unspecified vitamin D  deficiency    Allergies:  Allergies  Allergen Reactions   Adderall [Amphetamine-Dextroamphet Er]     MOOD CHANGE   Lipitor [Atorvastatin]     MYALGIA   Other     FLU VACCINE   Wellbutrin [Bupropion]     HA   Zoloft [Sertraline Hcl]     AGGITATION    Surgical History:  He  has a past surgical history that includes Colonoscopy and Polypectomy. Family History:  Hisfamily history includes Breast cancer in his mother; Hyperlipidemia in his brother and sister; Hypertension in his brother, father, mother, and sister. Social History:   reports that he quit smoking about 18 years ago. His smoking use included cigars. His smokeless tobacco use includes chew. He reports current alcohol use of about 7.0 standard drinks of alcohol per week. He reports that he does not use drugs.   Screening Tests: Immunization History  Administered Date(s) Administered   DT (Pediatric) 09/16/2014   PPD Test 09/16/2014, 10/28/2015   Pneumococcal-Unspecified 07/31/1999   Td 07/31/2003   Zoster, Live 05/26/2013    Preventative care: Last colonoscopy: 05/2019, 7 polyps, Dr. Abran   TD or Tdap: 2016  Influenza: Declines Pneumococcal: would consider 20 valent, discuss next visit  Zoster 2014 Shingles/Zostavax: Discussed Covid 19 -2/2 2021, report requested  Names of Other Physician/Practitioners you currently use: 1. [facility] here for primary care 2. Eye Exam, 04/2022 with new Rx glasses 3.Dental exam: Dr. Carolee,   Patient Care Team: Tonita Fallow, MD as PCP - General (Internal Medicine)  MEDICARE WELLNESS OBJECTIVES: Physical activity:   Cardiac risk factors:   Depression/mood screen:      01/24/2023    1:41 PM  Depression screen PHQ 2/9  Decreased Interest 0  Down, Depressed, Hopeless 0  PHQ - 2 Score 0    ADLs:     01/24/2023    1:41 PM 10/15/2022    9:26 PM  In your present state of health, do you have any difficulty performing the following activities:  Hearing? 0 0  Vision? 0 0  Difficulty concentrating or making decisions? 0 0  Walking or climbing stairs? 0 0  Dressing or bathing? 0 0  Doing errands, shopping? 0 0     Cognitive Testing  Alert? Yes  Normal Appearance?Yes  Oriented to  person? Yes  Place? Yes   Time? Yes  Recall of three objects?  Yes  Can perform simple calculations? Yes  Displays appropriate judgment?Yes  Can read the correct time from a watch face?Yes  EOL planning:        Review of Systems:  Review of Systems  Constitutional:  Negative for malaise/fatigue and weight loss.  HENT:  Negative for hearing loss and tinnitus.   Eyes:  Negative for blurred vision and double vision.  Respiratory:  Negative for cough, shortness of breath and wheezing.   Cardiovascular:  Negative for chest pain, palpitations, orthopnea, claudication and leg swelling.  Gastrointestinal:  Negative for abdominal pain, blood in stool, constipation, diarrhea, heartburn, melena, nausea and vomiting.  Genitourinary: Negative.   Musculoskeletal:  Negative for joint pain and myalgias.  Skin:  Negative for rash.  Neurological:  Negative for tingling, sensory change, weakness and headaches.  Endo/Heme/Allergies:  Negative for polydipsia.  Psychiatric/Behavioral: Negative.    All other systems reviewed  and are negative.   Physical Exam: There were no vitals taken for this visit. Wt Readings from Last 3 Encounters:  04/29/23 220 lb 9.6 oz (100.1 kg)  01/24/23 211 lb 9.6 oz (96 kg)  10/16/22 200 lb 9.6 oz (91 kg)   General Appearance: Well nourished, in no apparent distress. Eyes: PERRLA, EOMs, conjunctiva no swelling or erythema Sinuses: No Frontal/maxillary tenderness ENT/Mouth: Ext aud canals clear, TMs without erythema, bulging. Mouth and nose not examined- patient wearing a facemask  Hearing normal.  Neck: Supple, thyroid  normal.  Respiratory: Respiratory effort normal, BS equal bilaterally without rales, rhonchi, wheezing or stridor.  Cardio: RRR with no MRGs. Brisk peripheral pulses without edema.  Abdomen: Soft distended, + BS. Obese Non tender, no guarding, rebound, hernias, masses. Lymphatics: Non tender without lymphadenopathy.  Musculoskeletal: Full ROM, 5/5  strength, Normal gait Skin: Warm, dry flaky, without rashes, lesions, ecchymosis.  Neuro: Cranial nerves intact. No cerebellar symptoms.  Psych: Awake and oriented X 3, normal affect, Insight and Judgment appropriate.    Medicare Attestation I have personally reviewed: The patient's medical and social history Their use of alcohol, tobacco or illicit drugs Their current medications and supplements The patient's functional ability including ADLs,fall risks, home safety risks, cognitive, and hearing and visual impairment Diet and physical activities Evidence for depression or mood disorders  The patient's weight, height, BMI, and visual acuity have been recorded in the chart.  I have made referrals, counseling, and provided education to the patient based on review of the above and I have provided the patient with a written personalized care plan for preventive services.      BASCOM NECESSARY, NP 10:17 PM [facility]

## 2023-07-30 ENCOUNTER — Ambulatory Visit: Payer: Medicare HMO | Admitting: Nurse Practitioner

## 2023-10-24 ENCOUNTER — Encounter: Payer: Medicare HMO | Admitting: Internal Medicine

## 2023-10-28 ENCOUNTER — Encounter: Payer: Medicare HMO | Admitting: Internal Medicine

## 2024-01-27 ENCOUNTER — Ambulatory Visit: Payer: Medicare HMO | Admitting: Nurse Practitioner

## 2024-01-29 ENCOUNTER — Ambulatory Visit: Payer: Medicare HMO | Admitting: Nurse Practitioner

## 2024-02-19 DIAGNOSIS — E1165 Type 2 diabetes mellitus with hyperglycemia: Secondary | ICD-10-CM | POA: Diagnosis not present

## 2024-02-19 DIAGNOSIS — E1169 Type 2 diabetes mellitus with other specified complication: Secondary | ICD-10-CM | POA: Diagnosis not present

## 2024-02-19 DIAGNOSIS — Z125 Encounter for screening for malignant neoplasm of prostate: Secondary | ICD-10-CM | POA: Diagnosis not present

## 2024-02-19 DIAGNOSIS — E559 Vitamin D deficiency, unspecified: Secondary | ICD-10-CM | POA: Diagnosis not present
